# Patient Record
Sex: Male | Born: 1945 | Race: White | Hispanic: No | Marital: Married | Smoking: Former smoker
Health system: Southern US, Community
[De-identification: ages and names within clinical notes are randomized; demographics above are authoritative.]

## PROBLEM LIST (undated history)

## (undated) DIAGNOSIS — F32A Depression, unspecified: Secondary | ICD-10-CM

## (undated) DIAGNOSIS — M1990 Unspecified osteoarthritis, unspecified site: Secondary | ICD-10-CM

## (undated) DIAGNOSIS — R011 Cardiac murmur, unspecified: Secondary | ICD-10-CM

## (undated) DIAGNOSIS — Z8709 Personal history of other diseases of the respiratory system: Secondary | ICD-10-CM

## (undated) DIAGNOSIS — R519 Headache, unspecified: Secondary | ICD-10-CM

## (undated) DIAGNOSIS — Z8701 Personal history of pneumonia (recurrent): Secondary | ICD-10-CM

## (undated) DIAGNOSIS — E785 Hyperlipidemia, unspecified: Secondary | ICD-10-CM

## (undated) DIAGNOSIS — H9190 Unspecified hearing loss, unspecified ear: Secondary | ICD-10-CM

## (undated) DIAGNOSIS — K5730 Diverticulosis of large intestine without perforation or abscess without bleeding: Secondary | ICD-10-CM

## (undated) DIAGNOSIS — R110 Nausea: Secondary | ICD-10-CM

## (undated) DIAGNOSIS — I10 Essential (primary) hypertension: Secondary | ICD-10-CM

## (undated) DIAGNOSIS — R51 Headache: Secondary | ICD-10-CM

## (undated) DIAGNOSIS — K635 Polyp of colon: Secondary | ICD-10-CM

## (undated) DIAGNOSIS — T7840XA Allergy, unspecified, initial encounter: Secondary | ICD-10-CM

## (undated) DIAGNOSIS — R1013 Epigastric pain: Secondary | ICD-10-CM

## (undated) DIAGNOSIS — Z973 Presence of spectacles and contact lenses: Secondary | ICD-10-CM

## (undated) DIAGNOSIS — F329 Major depressive disorder, single episode, unspecified: Secondary | ICD-10-CM

## (undated) DIAGNOSIS — H269 Unspecified cataract: Secondary | ICD-10-CM

## (undated) DIAGNOSIS — M25569 Pain in unspecified knee: Secondary | ICD-10-CM

## (undated) DIAGNOSIS — R0600 Dyspnea, unspecified: Secondary | ICD-10-CM

## (undated) HISTORY — DX: Essential (primary) hypertension: I10

## (undated) HISTORY — DX: Hyperlipidemia, unspecified: E78.5

## (undated) HISTORY — DX: Allergy, unspecified, initial encounter: T78.40XA

## (undated) HISTORY — PX: POLYPECTOMY: SHX149

## (undated) HISTORY — DX: Unspecified osteoarthritis, unspecified site: M19.90

## (undated) HISTORY — PX: BACK SURGERY: SHX140

## (undated) HISTORY — PX: TONSILLECTOMY: SUR1361

## (undated) HISTORY — DX: Cardiac murmur, unspecified: R01.1

## (undated) HISTORY — PX: HERNIA REPAIR: SHX51

## (undated) HISTORY — DX: Depression, unspecified: F32.A

## (undated) HISTORY — PX: APPENDECTOMY: SHX54

## (undated) HISTORY — PX: JOINT REPLACEMENT: SHX530

## (undated) HISTORY — DX: Polyp of colon: K63.5

## (undated) HISTORY — DX: Epigastric pain: R10.13

## (undated) HISTORY — DX: Diverticulosis of large intestine without perforation or abscess without bleeding: K57.30

## (undated) HISTORY — DX: Major depressive disorder, single episode, unspecified: F32.9

## (undated) HISTORY — PX: MULTIPLE TOOTH EXTRACTIONS: SHX2053

## (undated) HISTORY — PX: EYE SURGERY: SHX253

## (undated) HISTORY — DX: Nausea: R11.0

## (undated) HISTORY — PX: SHOULDER SURGERY: SHX246

## (undated) HISTORY — DX: Unspecified cataract: H26.9

## (undated) HISTORY — DX: Pain in unspecified knee: M25.569

---

## 1997-12-25 ENCOUNTER — Inpatient Hospital Stay (HOSPITAL_COMMUNITY): Admission: EM | Admit: 1997-12-25 | Discharge: 1997-12-29 | Payer: Self-pay | Admitting: Emergency Medicine

## 1999-07-12 ENCOUNTER — Other Ambulatory Visit: Admission: RE | Admit: 1999-07-12 | Discharge: 1999-07-12 | Payer: Self-pay | Admitting: Urology

## 1999-10-26 ENCOUNTER — Other Ambulatory Visit: Admission: RE | Admit: 1999-10-26 | Discharge: 1999-10-26 | Payer: Self-pay | Admitting: Urology

## 2000-05-19 ENCOUNTER — Other Ambulatory Visit: Admission: RE | Admit: 2000-05-19 | Discharge: 2000-05-19 | Payer: Self-pay | Admitting: Urology

## 2000-05-19 ENCOUNTER — Encounter (INDEPENDENT_AMBULATORY_CARE_PROVIDER_SITE_OTHER): Payer: Self-pay

## 2000-10-24 ENCOUNTER — Ambulatory Visit (HOSPITAL_BASED_OUTPATIENT_CLINIC_OR_DEPARTMENT_OTHER): Admission: RE | Admit: 2000-10-24 | Discharge: 2000-10-25 | Payer: Self-pay | Admitting: Orthopedic Surgery

## 2004-10-08 ENCOUNTER — Ambulatory Visit: Payer: Self-pay | Admitting: Internal Medicine

## 2004-10-10 ENCOUNTER — Encounter: Payer: Self-pay | Admitting: Internal Medicine

## 2004-10-22 ENCOUNTER — Ambulatory Visit: Payer: Self-pay | Admitting: Internal Medicine

## 2004-11-06 ENCOUNTER — Ambulatory Visit: Payer: Self-pay | Admitting: Internal Medicine

## 2004-11-06 HISTORY — PX: COLONOSCOPY: SHX174

## 2007-02-24 ENCOUNTER — Ambulatory Visit: Payer: Self-pay | Admitting: Internal Medicine

## 2007-02-25 ENCOUNTER — Ambulatory Visit: Payer: Self-pay | Admitting: Internal Medicine

## 2007-03-02 ENCOUNTER — Ambulatory Visit (HOSPITAL_COMMUNITY): Admission: RE | Admit: 2007-03-02 | Discharge: 2007-03-02 | Payer: Self-pay | Admitting: Internal Medicine

## 2007-08-25 DIAGNOSIS — I10 Essential (primary) hypertension: Secondary | ICD-10-CM

## 2007-08-25 DIAGNOSIS — K644 Residual hemorrhoidal skin tags: Secondary | ICD-10-CM | POA: Insufficient documentation

## 2007-08-25 DIAGNOSIS — D126 Benign neoplasm of colon, unspecified: Secondary | ICD-10-CM

## 2007-08-25 DIAGNOSIS — R1013 Epigastric pain: Secondary | ICD-10-CM

## 2007-08-25 DIAGNOSIS — G4730 Sleep apnea, unspecified: Secondary | ICD-10-CM | POA: Insufficient documentation

## 2007-08-25 DIAGNOSIS — K209 Esophagitis, unspecified without bleeding: Secondary | ICD-10-CM | POA: Insufficient documentation

## 2007-08-25 DIAGNOSIS — E785 Hyperlipidemia, unspecified: Secondary | ICD-10-CM

## 2007-08-25 DIAGNOSIS — M1990 Unspecified osteoarthritis, unspecified site: Secondary | ICD-10-CM | POA: Insufficient documentation

## 2007-08-25 DIAGNOSIS — F39 Unspecified mood [affective] disorder: Secondary | ICD-10-CM | POA: Insufficient documentation

## 2007-08-25 DIAGNOSIS — K5730 Diverticulosis of large intestine without perforation or abscess without bleeding: Secondary | ICD-10-CM | POA: Insufficient documentation

## 2007-08-25 DIAGNOSIS — J45909 Unspecified asthma, uncomplicated: Secondary | ICD-10-CM | POA: Insufficient documentation

## 2007-08-25 DIAGNOSIS — K2990 Gastroduodenitis, unspecified, without bleeding: Secondary | ICD-10-CM

## 2007-08-25 DIAGNOSIS — R12 Heartburn: Secondary | ICD-10-CM

## 2007-08-25 DIAGNOSIS — K2970 Gastritis, unspecified, without bleeding: Secondary | ICD-10-CM | POA: Insufficient documentation

## 2007-08-25 DIAGNOSIS — K219 Gastro-esophageal reflux disease without esophagitis: Secondary | ICD-10-CM | POA: Insufficient documentation

## 2009-11-23 ENCOUNTER — Encounter (INDEPENDENT_AMBULATORY_CARE_PROVIDER_SITE_OTHER): Payer: Self-pay | Admitting: *Deleted

## 2010-04-24 ENCOUNTER — Ambulatory Visit: Payer: Self-pay | Admitting: Cardiology

## 2010-05-31 ENCOUNTER — Encounter (INDEPENDENT_AMBULATORY_CARE_PROVIDER_SITE_OTHER): Payer: Self-pay | Admitting: *Deleted

## 2010-06-19 ENCOUNTER — Encounter (INDEPENDENT_AMBULATORY_CARE_PROVIDER_SITE_OTHER): Payer: Self-pay | Admitting: *Deleted

## 2010-06-20 ENCOUNTER — Ambulatory Visit
Admission: RE | Admit: 2010-06-20 | Discharge: 2010-06-20 | Payer: Self-pay | Source: Home / Self Care | Attending: Internal Medicine | Admitting: Internal Medicine

## 2010-07-04 ENCOUNTER — Ambulatory Visit
Admission: RE | Admit: 2010-07-04 | Discharge: 2010-07-04 | Payer: Self-pay | Source: Home / Self Care | Attending: Internal Medicine | Admitting: Internal Medicine

## 2010-07-04 ENCOUNTER — Other Ambulatory Visit: Payer: Self-pay | Admitting: Internal Medicine

## 2010-07-04 DIAGNOSIS — D126 Benign neoplasm of colon, unspecified: Secondary | ICD-10-CM

## 2010-07-09 ENCOUNTER — Encounter: Payer: Self-pay | Admitting: Internal Medicine

## 2010-07-10 NOTE — Letter (Signed)
Summary: Colonoscopy Letter  [facility]  [address] [place], [place] [address]   Phone: [phone]  Fax: [phone]      November 23, 2009 MRN: [id number]   AMERE BRICCO [address] [place], [place]  [address]   Dear Mr. Goldinger,   According to your medical record, it is time for you to schedule a Colonoscopy. The American Cancer Society recommends this procedure as a method to detect early colon cancer. Patients with a family history of colon cancer, or a personal history of colon polyps or inflammatory bowel disease are at increased risk.  This letter has been generated based on the recommendations made at the time of your procedure. If you feel that in your particular situation this may no longer apply, please contact our office.  Please call our office at [phone] to schedule this appointment or to update your records at your earliest convenience.  Thank you for cooperating with [place] to provide you with the very best care possible.   Sincerely,  Wilhemina Bonito. Marina Goodell, M.D.  [facility] Gastroenterology Division [phone]

## 2010-07-12 NOTE — Procedures (Addendum)
Summary: Colonoscopy  Patient: Aubrey Voong Note: All result statuses are Final unless otherwise noted.  Tests: (1) Colonoscopy (COL)   COL Colonoscopy           DONE     [facility]     [address]     [place], [place]  [address]           COLONOSCOPY PROCEDURE REPORT           PATIENT:  Traylon, Schimming  MR#:  [id number]     BIRTHDATE:  02-29-1946, 64 yrs. old  GENDER:  male     ENDOSCOPIST:  Wilhemina Bonito. Eda Keys, MD     REF. BY:  Surveillance Program Recall,     PROCEDURE DATE:  07/04/2010     PROCEDURE:  Colonoscopy with snare polypectomy x 4     ASA CLASS:  Class II     INDICATIONS:  history of pre-cancerous (adenomatous) colon polyps,     surveillance and high-risk screening ; index 2004 w/ TA; f/u 2006     negative     MEDICATIONS:   Fentanyl 50 mcg IV, Versed 39 mg IV           DESCRIPTION OF PROCEDURE:   After the risks benefits and     alternatives of the procedure were thoroughly explained, informed     consent was obtained.  Digital rectal exam was performed and     revealed no abnormalities.   The LB CF-[id number] endoscope     was introduced through the anus and advanced to the cecum, which     was identified by both the appendix and ileocecal valve, without     limitations.Time to cecum = 2:30 min.  The quality of the prep was     excellent, using MoviPrep.  The instrument was then slowly     withdrawn (time = 13:41 min) as the colon was fully examined.     <<PROCEDUREIMAGES>>           FINDINGS:  There were FOUR polyps identified and removed. 5mm     sessile in the cecum, 4mm in the transverse colon, 3mm in the     sigmoid and 4mm in the descending colon. Polyps were snared     without cautery. Retrieval was successful.  Moderate     diverticulosis was found in the sigmoid colon.  Otherwise normal     colonoscopy without  masses, vascular ectasias, or inflammatory     changes.   Retroflexed views in the rectum revealed no     abnormalities.    The  scope was then withdrawn from the patient     and the procedure completed.           COMPLICATIONS:  None     ENDOSCOPIC IMPRESSION:     1) Polyps, multiple - removeds     2) Moderate diverticulosis in the sigmoid colon     3) Otherwise nl colonoscopy           RECOMMENDATIONS:     1) Repeat Colonoscopy in 3 years.           ______________________________     Wilhemina Bonito. Eda Keys, MD           CC:  The Patient;  Guerry Bruin, MD           n.     eSIGNED:   Wilhemina Bonito. Eda Keys at 07/04/2010 01:47 PM  Dolph, Tavano, [id number]  Note: An exclamation mark (!) indicates a result that was not dispersed into the flowsheet. Document Creation Date: 07/04/2010 1:47 PM _______________________________________________________________________  (1) Order result status: Final Collection or observation date-time: 07/04/2010 13:36 Requested date-time:  Receipt date-time:  Reported date-time:  Referring Physician:   Ordering Physician: Fransico Setters [id number]) Specimen Source:  Source: Launa Grill Order Number: [id number] Lab site:   Appended Document: Colonoscopy recall 3 yrs     Procedures Next Due Date:    Colonoscopy: 06/2013

## 2010-07-12 NOTE — Miscellaneous (Signed)
Summary: [facility] Previsit/prep  Clinical Lists Changes  Medications: Added new medication of MOVIPREP 100 GM  SOLR (PEG-KCL-NACL-NASULF-NA ASC-C) As per prep instructions. - Signed Rx of MOVIPREP 100 GM  SOLR (PEG-KCL-NACL-NASULF-NA ASC-C) As per prep instructions.;  #1 x 0;  Signed;  Entered by: Wyona Almas RN;  Authorized by: Hilarie Fredrickson MD;  Method used: Electronically to [facility] [place], [place], [place]  [address], Ph: [phone], Fax: [phone] Allergies: Added new allergy or adverse reaction of AMOXICILLIN Added new allergy or adverse reaction of LIPITOR Added new allergy or adverse reaction of * PROVASTATIN Observations: Added new observation of NKA: F (06/20/2010 10:42)    Prescriptions: MOVIPREP 100 GM  SOLR (PEG-KCL-NACL-NASULF-NA ASC-C) As per prep instructions.  #1 x 0   Entered by:   Wyona Almas RN   Authorized by:   Hilarie Fredrickson MD   Signed by:   Wyona Almas RN on 06/20/2010   Method used:   Electronically to        [facility] (retail)       [address]       [place], [place]  [address]       Ph: [phone]       Fax: [phone]   RxID:   [id number]

## 2010-07-12 NOTE — Letter (Signed)
Summary: [facility] Instructions  [facility]  [address] [place], [place] [address]   Phone: [phone]  Fax: [phone]       [name]    06/04/1946    MRN: [id number]        Procedure Day Dorna Bloom: Wednesday 07/04/10     Arrival Time:  10:30 am      Procedure Time:  11:30 am     Location of Procedure:                    _X_  [facility] (4th Floor)                       PREPARATION FOR COLONOSCOPY WITH MOVIPREP   Starting 5 days prior to your procedure Friday 06/29/10 do not eat nuts, seeds, popcorn, corn, beans, peas,  salads, or any raw vegetables.  Do not take any fiber supplements (e.g. Metamucil, Citrucel, and Benefiber).  THE DAY BEFORE YOUR PROCEDURE         DATE: 07/03/10  DAY: Tuesday  1.  Drink clear liquids the entire day-NO SOLID FOOD  2.  Do not drink anything colored red or purple.  Avoid juices with pulp.  No orange juice.  3.  Drink at least 64 oz. (8 glasses) of fluid/clear liquids during the day to prevent dehydration and help the prep work efficiently.  CLEAR LIQUIDS INCLUDE: Water Jello Ice Popsicles Tea (sugar ok, no milk/cream) Powdered fruit flavored drinks Coffee (sugar ok, no milk/cream) Gatorade Juice: apple, white grape, white cranberry  Lemonade Clear bullion, consomm, broth Carbonated beverages (any kind) Strained chicken noodle soup Hard Candy                             4.  In the morning, mix first dose of MoviPrep solution:    Empty 1 Pouch A and 1 Pouch B into the disposable container    Add lukewarm drinking water to the top line of the container. Mix to dissolve    Refrigerate (mixed solution should be used within 24 hrs)  5.  Begin drinking the prep at 5:00 p.m. The MoviPrep container is divided by 4 marks.   Every 15 minutes drink the solution down to the next mark (approximately 8 oz) until the full liter is complete.   6.  Follow completed prep with 16 oz of clear liquid of your choice  (Nothing red or purple).  Continue to drink clear liquids until bedtime.  7.  Before going to bed, mix second dose of MoviPrep solution:    Empty 1 Pouch A and 1 Pouch B into the disposable container    Add lukewarm drinking water to the top line of the container. Mix to dissolve    Refrigerate  THE DAY OF YOUR PROCEDURE      DATE: 07/04/10  DAY: Wednesday  Beginning at 6:30 a.m. (5 hours before procedure):         1. Every 15 minutes, drink the solution down to the next mark (approx 8 oz) until the full liter is complete.  2. Follow completed prep with 16 oz. of clear liquid of your choice.    3. You may drink clear liquids until 9:30 am (2 HOURS BEFORE PROCEDURE).   MEDICATION INSTRUCTIONS  Unless otherwise instructed, you should take regular prescription medications with a small sip of water   as early as possible  the morning of your procedure.        OTHER INSTRUCTIONS  You will need a responsible adult at least 67 years of age to accompany you and drive you home.   This person must remain in the waiting room during your procedure.  Wear loose fitting clothing that is easily removed.  Leave jewelry and other valuables at home.  However, you may wish to bring a book to read or  an iPod/MP3 player to listen to music as you wait for your procedure to start.  Remove all body piercing jewelry and leave at home.  Total time from sign-in until discharge is approximately 2-3 hours.  You should go home directly after your procedure and rest.  You can resume normal activities the  day after your procedure.  The day of your procedure you should not:   Drive   Make legal decisions   Operate machinery   Drink alcohol   Return to work  You will receive specific instructions about eating, activities and medications before you leave.    The above instructions have been reviewed and explained to me by   Wyona Almas RN  June 20, 2010 11:54 AM     I fully  understand and can verbalize these instructions _____________________________ Date _________

## 2010-07-12 NOTE — Letter (Signed)
Summary: Pre Visit Letter Revised  [facility]  [address] [place], [place] [address]   Phone: [phone]  Fax: [phone]        05/31/2010 MRN: [id number] [name] [address] [place], [place]  [address]             Procedure Date:  07/04/2010   Dr. Marina Goodell @ 11:30 am  Welcome to the Gastroenterology Division at [facility].    You are scheduled to see a nurse for your pre-procedure visit on 06/20/2010 at 11:00 on the 3rd floor at [facility], [address]  We ask that you try to arrive at our office 15 minutes prior to your appointment time to allow for check-in.  Please take a minute to review the attached form.  If you answer "Yes" to one or more of the questions on the first page, we ask that you call the person listed at your earliest opportunity.  If you answer "No" to all of the questions, please complete the rest of the form and bring it to your appointment.    Your nurse visit will consist of discussing your medical and surgical history, your immediate family medical history, and your medications.   If you are unable to list all of your medications on the form, please bring the medication bottles to your appointment and we will list them.  We will need to be aware of both prescribed and over the counter drugs.  We will need to know exact dosage information as well.    Please be prepared to read and sign documents such as consent forms, a financial agreement, and acknowledgement forms.  If necessary, and with your consent, a friend or relative is welcome to sit-in on the nurse visit with you.  Please bring your insurance card so that we may make a copy of it.  If your insurance requires a referral to see a specialist, please bring your referral form from your primary care physician.  No co-pay is required for this nurse visit.     If you cannot keep your appointment, please call [phone] to cancel or reschedule prior to your  appointment date.  This allows [place] the opportunity to schedule an appointment for another patient in need of care.    Thank you for choosing [facility] for your medical needs.  We appreciate the opportunity to care for you.  Please visit [place] at our website  to learn more about our practice.  Sincerely, The Gastroenterology Division

## 2010-07-18 NOTE — Letter (Signed)
Summary: Patient Notice- Polyp Results  [facility]  [address] [place], [place] [address]   Phone: [phone]  Fax: [phone]        July 09, 2010 MRN: [id number]    [name] [address] [place], [place]  [address]    Dear [name],  I am pleased to inform you that the colon polyp(s) removed during your recent colonoscopy was (were) found to be benign (no cancer detected) upon pathologic examination.  I recommend you have a repeat colonoscopy examination in 3 years to look for recurrent polyps, as having colon polyps increases your risk for having recurrent polyps or even colon cancer in the future.  Should you develop new or worsening symptoms of abdominal pain, bowel habit changes or bleeding from the rectum or bowels, please schedule an evaluation with either your primary care physician or with me.    Additional information/recommendations:  __ No further action with gastroenterology is needed at this time. Please      follow-up with your primary care physician for your other healthcare      needs.    Please call us if you are having persistent problems or have questions about your condition that have not been fully answered at this time.  Sincerely,  Hilarie Fredrickson MD  This letter has been electronically signed by your physician.  Appended Document: Patient Notice- Polyp Results LETTER MAILED

## 2010-10-23 NOTE — Assessment & Plan Note (Signed)
[facility]                         GASTROENTEROLOGY OFFICE NOTE   NAME:Mccarey, JAZON JIPSON                       MRN:          [id number]  DATE:02/24/2007                            DOB:          1945-07-02    REFERRING PHYSICIAN:  The patient is self-referred.   REASON FOR VISIT:  Epigastric pain.   HISTORY:  This is a 65 year old white male with a history of  hypertension, asthma, hyperlipidemia, osteoarthritis, depression, sleep  apnea, and adenomatous colon polyps.  He last underwent colonoscopy on  Nov 06, 2004.  He was found to have sigmoid diverticulosis.  No polyps.  Follow up in five years recommended.  He presents now with a complaint  of epigastric pain of one years' duration.  He describes it as constant  and dull.  It seems to be improved transiently with food.  Occasional  nausea.  No vomiting.  He has significant pyrosis as well as  intermittent water brash, particularly at night with dietary  indiscretion.  He has also had three discrete episodes of significant  dysphagia with transient food impaction over the past year.  He was  placed on some Nexium samples by Dr. Wylene Simmer but was not certain that  this was helpful.  He has had steady weight gain.  He is anxious.  The  patient has been on nonsteroidal anti-inflammatory agents intermittently  for knee pain.  He does report transient dark stools.  No hematochezia.  No urinary complaints.   PAST MEDICAL HISTORY:  As above.   PAST SURGICAL HISTORY:  1. Hernia repair.  2. Appendectomy.   CURRENT MEDICATIONS:  1. Benicar 20 mg daily.  2. Celebrex 200 mg daily.  3. Zetia 10 mg daily.  4. Multivitamin.  5. Vitamin E.  6. Saw Palmetto.  7. Baby aspirin.  8. Advil and Aleve p.r.n.   ALLERGIES:  AMOXICILLIN.   FAMILY HISTORY:  Colon cancer in maternal grandfather.   SOCIAL HISTORY:  Married with three children.  He lives with his wife.  He does not smoke or use alcohol.   PHYSICAL EXAMINATION:  GENERAL:  Anxious, but otherwise well-appearing  male in no acute distress.  VITAL SIGNS:  Blood pressure 108/70, heart rate 76 and regular, weight  229 pounds (increased 6 pounds).  HEENT:  Sclerae anicteric, conjunctivae pink.  Oral mucosa is intact.  No adenopathy.  LUNGS:  Clear.  HEART:  Regular.  ABDOMEN:  Obese and soft without tenderness, mass, or hernia.  Good  bowel sounds heard.  EXTREMITIES:  Without edema.   IMPRESSION:  1. Gastroesophageal reflux disease with intermittent dysphagia.  Rule      out peptic stricture.  2. Chronic gnawing epigastric discomfort possibly related to reflux.      Rule out ulcer disease.  Rule out gallbladder disease.   RECOMMENDATIONS:  1. Prevacid 30 mg daily.  Multiple samples have been provided.  2. Schedule upper endoscopy with possible esophageal dilation.  The      nature of the procedure as well as the risks, benefits, and      alternatives have been reviewed.  He  understood and agreed to      proceed.  Literature on the procedure was provided.  3. Reflux precautions reviewed and literature provided.  4. Schedule abdominal ultrasound to evaluate epigastric pain.  5. Keep plans for surveillance colonoscopy in 2011.     Wilhemina Bonito. Marina Goodell, MD  Electronically Signed    JNP/MedQ  DD: 02/24/2007  DT: 02/24/2007  Job #: [id number]   cc:   Gaspar Garbe, M.D.

## 2010-10-26 NOTE — Op Note (Signed)
[place]  Patient:    [name], [name]                         MRN: [id number]. Date: 10/24/00 Attending:  Loreta Ave, M.D.                           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  OPERATION:  Repair/reconstruction AC separation right shoulder with coracoclavicular repair with fiber wire suture, #2 times two.  Resection distal clavicle.  Transfer coracoacromial ligament into the distal clavicle reconstructing coracoclavicular ligament.  SURGEON:  Loreta Ave, M.D.  ASSISTANT:  Arlys John D. Petrarca, P.A.-C.  ANESTHESIA:  General.  ESTIMATED BLOOD LOSS:  Minimal.  SPECIMENS: None.  CULTURES:  None.  COMPLICATIONS:  None.  DRESSING: Soft compressive with sling.  DESCRIPTION OF PROCEDURE:  The patient was brought to the operating room and after adequate anesthesia had been obtained, placed in a beach chair position on the shoulder positioner, prepped and draped in the usual sterile fashion. Saber incision over the clavicle down towards the coracoid.  Skin and subcutaneous tissue divided.  Deltopectoral fascia opened over the top of the clavicle extending over to the acromion.  Vicryl sutures placed for retraction of the pectoralis anteriorly.  The clavicle exposed with grade IV changes distally. Blunt dissection was used to open the interval below the clavicle extending down to the coracoid protecting neurovascular structures.  A right ankle retractor was used to access under the coracoid and utilizing this and a suture passer, two #2 fiber wire sutures were passed under the coracoid and brought up to the bottom of the clavicle.  Directly above the coracoid a drill hole  was made through the clavicle and the sutures were weaved through the clavicle in a figure-of-eight manner and tagged.  Distal centimeter of clavicle was then resected sharply.  The end of the clavicle was partially hollowed out to allow transfer of the coracoacromial ligament.  The coracoacromial ligament was accessed off the front of the acromion through the split in the deltoid musculature.  It was freed up left attached to the coracoid.  It was then tagged with two #2 Ethibon sutures which were weaved down into the ligament and then freed up sufficiently to transfer it over to the end of the clavicle.  The Ethibon sutures were then brought into the end of the clavicle and out through the superior side through two cortical drill holes.  Once those were in place, the wound was irrigated.  The clavicle was then reduced to an anatomic position over the top of the clavicle even with the top of the acromion.  The Ethibon sutures which had been weaved into the ligament were then pulled tight and tied over the top of the clavicle bringing the transverse ligament into the cancellus bone at the end of the clavicle. The other #2 fiber wire suture was then firmly sewn in place restoring stability of the coracoclavicular interval.  The knots on those sutures were pulled around and tucked down anteriorly so as to not be permanent.  The wound was irrigated.  Position of the distal clavicle was then accessed fluoroscopically and excellent.  With resection of the clavicle there was no contact  with the acromion.  Wound irrigated again.  The muscular sleeve was then oversewn and imbricated over top of the repair to ensure good stability. Vicryl was utilized for this.  The wound was then closed with subcutaneous and subcuticular Vicryl and Steri-Strips. Margins of the wound were injected with Marcaine.  A sterile compressive dressing and shoulder immobilizer were applied.  Anesthesia reversed. Brought to  recovery room. Tolerated surgery well with no complications. DD:  10/24/00 TD:  10/27/00 Job: [id number]

## 2011-02-20 ENCOUNTER — Other Ambulatory Visit: Payer: Self-pay | Admitting: Cardiology

## 2011-02-20 NOTE — Telephone Encounter (Signed)
Refilled Meds from fax  

## 2011-04-29 ENCOUNTER — Encounter: Payer: Self-pay | Admitting: Cardiology

## 2011-04-29 ENCOUNTER — Ambulatory Visit (INDEPENDENT_AMBULATORY_CARE_PROVIDER_SITE_OTHER): Payer: Medicare Other | Admitting: Cardiology

## 2011-04-29 VITALS — BP 128/83 | HR 69 | Ht 70.0 in | Wt 231.0 lb

## 2011-04-29 DIAGNOSIS — I10 Essential (primary) hypertension: Secondary | ICD-10-CM

## 2011-04-29 DIAGNOSIS — E785 Hyperlipidemia, unspecified: Secondary | ICD-10-CM

## 2011-04-29 NOTE — Patient Instructions (Signed)
I encourage you to increase your aerobic activity.  Continue your current medications.  Watch your diet and try and lose weight.  I will see you again in 1 year.

## 2011-04-29 NOTE — Progress Notes (Signed)
   [name] Date of Birth: [birth year] Medical Record #[id number]  History of Present Illness: [name] is seen for yearly follow up of HTN. He has a number of drug intolerances. He is doing well on benazepril. Blood pressure readings are good. No chest pain or dyspnea. Noted weight gain related to some anti-anxiety medication.  Current Outpatient Prescriptions on File Prior to Visit  Medication Sig Dispense Refill  . aspirin 81 MG tablet Take 81 mg by mouth daily.        . Cholecalciferol 5000 UNITS capsule Take 5,000 Units by mouth daily.        . folic acid (FOLVITE) 400 MCG tablet Take 800 mcg by mouth daily.        . multivitamin (THERAGRAN) per tablet Take 1 tablet by mouth daily.        Marland Kitchen DISCONTD: benazepril (LOTENSIN) 20 MG tablet TAKE 20 MG (1 TABLET IN AM), AND TAKE 10 MG (1/2 TABLET IN PM)  135 tablet  3    Allergies  Allergen Reactions  . Amoxicillin     REACTION: hives  . Atorvastatin     REACTION: Weakness in muscles  . Statins     Weakness in muscles  . Toprol Xl (Metoprolol Succinate)     Past Medical History  Diagnosis Date  . Hypertension   . Asthma   . Hyperlipidemia   . Osteoarthritis   . Depression   . Sleep apnea   . Colon polyps   . Sigmoid diverticulosis   . Epigastric pain   . Nausea     Occasional  . Knee pain     Past Surgical History  Procedure Date  . Colonoscopy  Nov 06, 2004  . Hernia repair   . Appendectomy     History  Smoking status  . Former Smoker  . Types: Cigarettes  . Quit date: 04/29/1983  Smokeless tobacco  . Never Used    History  Alcohol Use No    Family History  Problem Relation Age of Onset  . Cancer Maternal Grandfather     Colon Cancer    Review of Systems: As noted in HPI.  All other systems were reviewed and are negative.  Physical Exam: BP 128/83  Pulse 69  Ht 5\' 10"  (1.778 m)  Wt 231 lb (104.781 kg)  BMI 33.15 kg/m2 The patient is alert and oriented x 3.  The mood and affect are normal.   The skin is warm and dry.  Color is normal.  The HEENT exam reveals that the sclera are nonicteric.  The mucous membranes are moist.  The carotids are 2+ without bruits.  There is no thyromegaly.  There is no JVD.  The lungs are clear.  The chest wall is non tender.  The heart exam reveals a regular rate with a normal S1 and S2.  There are no murmurs, gallops, or rubs.  The PMI is not displaced.   Abdominal exam reveals good bowel sounds.  There is no guarding or rebound.  There is no hepatosplenomegaly or tenderness.  There are no masses.  Exam of the legs reveal no clubbing, cyanosis, or edema.  The legs are without rashes.  The distal pulses are intact.  Cranial nerves II - XII are intact.  Motor and sensory functions are intact.  The gait is normal.  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 Otherwise normal.  Assessment / Plan:

## 2011-04-29 NOTE — Assessment & Plan Note (Signed)
Blood pressure control is excellent. He is monitoring this closely at home. We will continue with his current dose of ACE inhibitor. I will see him back again in one year. His blood work is monitored routinely by his primary care.

## 2011-06-06 ENCOUNTER — Telehealth: Payer: Self-pay | Admitting: Internal Medicine

## 2011-06-06 NOTE — Telephone Encounter (Signed)
Patient reports that he has a 1 week history of abdominal pain.  He helped a friend move some furniture about 3 weeks ago and had back pain then developed constipation.  He has taken some laxatives to help with the constipation, he still has some pain on his left side even after taking laxatives. He also c/o of a "very sore spot about my belt line", he says the pain comes and goes from his back to the front.  I have advised he see his primary care or a urgent care today due to the lateness of the hour,.  Patient states he will go to an urgent care.

## 2011-07-02 ENCOUNTER — Other Ambulatory Visit: Payer: Self-pay | Admitting: Physical Medicine and Rehabilitation

## 2011-07-02 DIAGNOSIS — M545 Low back pain: Secondary | ICD-10-CM

## 2011-07-08 ENCOUNTER — Ambulatory Visit
Admission: RE | Admit: 2011-07-08 | Discharge: 2011-07-08 | Disposition: A | Discharge status: Home / Self Care | Payer: Medicare Other | Source: Ambulatory Visit | Attending: Physical Medicine and Rehabilitation | Admitting: Physical Medicine and Rehabilitation

## 2011-07-08 DIAGNOSIS — M545 Low back pain: Secondary | ICD-10-CM

## 2012-01-16 ENCOUNTER — Other Ambulatory Visit: Payer: Self-pay

## 2012-01-22 ENCOUNTER — Other Ambulatory Visit: Payer: Self-pay

## 2012-01-22 MED ORDER — BENAZEPRIL HCL 20 MG PO TABS: ORAL_TABLET | ORAL | Status: DC

## 2012-07-07 ENCOUNTER — Encounter: Payer: Self-pay | Admitting: Cardiology

## 2012-07-07 ENCOUNTER — Ambulatory Visit (INDEPENDENT_AMBULATORY_CARE_PROVIDER_SITE_OTHER): Payer: Medicare Other | Admitting: Cardiology

## 2012-07-07 VITALS — BP 118/79 | HR 74 | Wt 235.0 lb

## 2012-07-07 DIAGNOSIS — I10 Essential (primary) hypertension: Secondary | ICD-10-CM

## 2012-07-07 DIAGNOSIS — E785 Hyperlipidemia, unspecified: Secondary | ICD-10-CM

## 2012-07-07 NOTE — Patient Instructions (Signed)
Continue your current therapy  Focus on lifestyle modification with a Mediterranean diet, weight loss, and exercise.

## 2012-07-07 NOTE — Progress Notes (Signed)
[name] Date of Birth: 13-Jan-1946 Medical Record #[id number]  History of Present Illness: [name] is seen for yearly follow up of HTN. He has multiple drug intolerances. He is doing well on benazepril. Blood pressure readings are good. No chest pain or dyspnea. He has had problems with chronic back pain. This improved significantly with epidural injections but he always notices a 12-15 pound weight gain after each injection. He complains of his hands hurting a lot. He has increased his naproxen dose to 500 mg twice daily with some improvement. He thinks a lot of his aches and pains are related to the vitamin D he was taking and he has eliminated this.  Current Outpatient Prescriptions on File Prior to Visit  Medication Sig Dispense Refill  . aspirin 81 MG tablet Take 81 mg by mouth daily.        . benazepril (LOTENSIN) 20 MG tablet Take 50 mg by mouth daily.      . butalbital-acetaminophen-caffeine (ESGIC PLUS) 50-500-40 MG per tablet Take 1-2 tablets by mouth every 4 (four) hours as needed.        . folic acid (FOLVITE) 400 MCG tablet Take 800 mcg by mouth daily.        . multivitamin (THERAGRAN) per tablet Take 1 tablet by mouth daily.        . naproxen sodium (ANAPROX) 220 MG tablet Take 220 mg by mouth. 2 tablets in the morning and 1 tablet at night         Allergies  Allergen Reactions  . Amoxicillin     REACTION: hives  . Atorvastatin     REACTION: Weakness in muscles  . Statins     Weakness in muscles  . Toprol Xl (Metoprolol Succinate)     Past Medical History  Diagnosis Date  . Hypertension   . Asthma   . Hyperlipidemia   . Osteoarthritis   . Depression   . Sleep apnea   . Colon polyps   . Sigmoid diverticulosis   . Epigastric pain   . Nausea     Occasional  . Knee pain     Past Surgical History  Procedure Date  . Colonoscopy  Nov 06, 2004  . Hernia repair   . Appendectomy     History  Smoking status  . Former Smoker  . Types: Cigarettes  . Quit  date: 04/29/1983  Smokeless tobacco  . Never Used    History  Alcohol Use No    Family History  Problem Relation Age of Onset  . Cancer Maternal Grandfather     Colon Cancer    Review of Systems: As noted in HPI.  All other systems were reviewed and are negative.  Physical Exam: BP 118/79  Pulse 74  Wt 235 lb (106.595 kg) The patient is alert and oriented x 3.  The mood and affect are normal.  The skin is warm and dry.  Color is normal.  The HEENT exam is unremarkable.  The carotids are 2+ without bruits.  There is no thyromegaly.  There is no JVD.  The lungs are clear.  The heart exam reveals a regular rate with a normal S1 and S2.  There are no murmurs, gallops, or rubs.  The PMI is not displaced.   Abdominal exam reveals good bowel sounds.  There is no hepatosplenomegaly or tenderness.  There are no masses.  Exam of the legs reveal no clubbing, cyanosis, or edema.  The legs are without rashes.  The distal pulses are intact.  Cranial nerves II - XII are intact.  Motor and sensory functions are intact.  The gait is normal.  LABORATORY DATA: Ecg shows normal sinus rhythm with a normal ECG.  Assessment / Plan: 1. Hypertension. Well-controlled on benazepril. I would try to minimize his use of nonsteroidal anti-inflammatory drugs. I'll followup again in one year.  2. Hyperlipidemia. Recent lipid panel showed a total cholesterol of 288 with triglycerides of 279. He has been intolerant to statins in the past. He is on fish oil. I recommended lifestyle modifications with Mediterranean diet, weight loss, and regular aerobic exercise. He has been placed on a low-dose of Zetia.

## 2012-08-12 ENCOUNTER — Encounter: Payer: Self-pay | Admitting: Cardiology

## 2013-04-12 DIAGNOSIS — N529 Male erectile dysfunction, unspecified: Secondary | ICD-10-CM | POA: Insufficient documentation

## 2013-06-23 ENCOUNTER — Encounter: Payer: Self-pay | Admitting: Internal Medicine

## 2013-07-15 ENCOUNTER — Telehealth: Payer: Self-pay | Admitting: Internal Medicine

## 2013-07-15 NOTE — Telephone Encounter (Signed)
Pt received recall letter in the mail for his colon. Pt would like to have an EGD also. Last EGD was done in 2008. Pt states he does still have reflux, states he takes Tums Ultra when needed. Please advise.

## 2013-07-15 NOTE — Telephone Encounter (Signed)
Doesn't need EGD. Rather, should take omeprazole 20 mg daily (not Tums). We can call him in a prescription if he wishes.

## 2013-07-16 ENCOUNTER — Ambulatory Visit (AMBULATORY_SURGERY_CENTER): Payer: Self-pay | Admitting: *Deleted

## 2013-07-16 VITALS — Ht 70.0 in | Wt 235.4 lb

## 2013-07-16 DIAGNOSIS — Z86010 Personal history of colonic polyps: Secondary | ICD-10-CM

## 2013-07-16 MED ORDER — MOVIPREP 100 G PO SOLR: 1.0000 | Freq: Once | ORAL | Status: DC

## 2013-07-16 NOTE — Telephone Encounter (Signed)
Pt aware. Pt scheduled for recall colon 07/23/13@3pm . Pt coming today for previsit. Pt aware of appt.

## 2013-07-16 NOTE — Progress Notes (Signed)
No egg or soy allergy. ewm No cpap or home 02 use. ewm Pt declined emmi video. ewm Pt denies problems with past sedation. ewm

## 2013-07-19 ENCOUNTER — Encounter: Payer: Self-pay | Admitting: Cardiology

## 2013-07-19 ENCOUNTER — Ambulatory Visit (INDEPENDENT_AMBULATORY_CARE_PROVIDER_SITE_OTHER): Payer: Medicare HMO | Admitting: Cardiology

## 2013-07-19 VITALS — BP 138/84 | HR 67 | Ht 70.0 in | Wt 233.1 lb

## 2013-07-19 DIAGNOSIS — R0609 Other forms of dyspnea: Secondary | ICD-10-CM

## 2013-07-19 DIAGNOSIS — E785 Hyperlipidemia, unspecified: Secondary | ICD-10-CM

## 2013-07-19 DIAGNOSIS — R0600 Dyspnea, unspecified: Secondary | ICD-10-CM | POA: Insufficient documentation

## 2013-07-19 DIAGNOSIS — I10 Essential (primary) hypertension: Secondary | ICD-10-CM

## 2013-07-19 DIAGNOSIS — R0989 Other specified symptoms and signs involving the circulatory and respiratory systems: Secondary | ICD-10-CM

## 2013-07-19 MED ORDER — NAPROXEN 500 MG PO TABS: 500.0000 mg | ORAL_TABLET | Freq: Two times a day (BID) | ORAL | Status: DC

## 2013-07-19 MED ORDER — EZETIMIBE 10 MG PO TABS: 10.0000 mg | ORAL_TABLET | Freq: Every day | ORAL | Status: DC

## 2013-07-19 MED ORDER — FENOFIBRATE 160 MG PO TABS: 160.0000 mg | ORAL_TABLET | Freq: Every day | ORAL | Status: DC

## 2013-07-19 NOTE — Patient Instructions (Signed)
We will add fenofibrate 160 mg daily  Continue your other therapy  I will see you in one year

## 2013-07-19 NOTE — Progress Notes (Signed)
[name] Date of Birth: [birth year] Medical Record #[id number]  History of Present Illness: [name] is seen for yearly follow up of HTN and hyperlipidemia. He has multiple drug intolerances. He is doing well on benazepril. Blood pressure readings are good. No chest pain. He does complain of dyspnea with walking. His arthralgias are doing well on Naproxen 500 mg bid. He brings a copy of his recent lab work.  Current Outpatient Prescriptions on File Prior to Visit  Medication Sig Dispense Refill  . aspirin 81 MG tablet Take 81 mg by mouth daily.        . benazepril (LOTENSIN) 20 MG tablet Take 40 mg by mouth daily.       . butalbital-acetaminophen-caffeine (ESGIC PLUS) 50-500-40 MG per tablet Take 1-2 tablets by mouth every 4 (four) hours as needed.        . ezetimibe (ZETIA) 10 MG tablet Take 10 mg by mouth daily.      . folic acid (FOLVITE) 409 MCG tablet Take 800 mcg by mouth daily.        . multivitamin (THERAGRAN) per tablet Take 1 tablet by mouth daily.        . naproxen (NAPROSYN) 500 MG tablet Take 500 mg by mouth 2 (two) times daily with a meal.      . MOVIPREP 100 G SOLR Take 1 kit (200 g total) by mouth once. moviprep as directed. No substitutions  1 kit  0   No current facility-administered medications on file prior to visit.    Allergies  Allergen Reactions  . Toprol Xl [Metoprolol Succinate] Other (See Comments)    Pt states is " like a govenor on a bus"   . Amoxicillin     REACTION: hives  . Atorvastatin     REACTION: Weakness in muscles  . Statins     Weakness in muscles    Past Medical History  Diagnosis Date  . Hypertension   . Asthma   . Hyperlipidemia   . Osteoarthritis   . Depression   . Sleep apnea   . Colon polyps   . Sigmoid diverticulosis   . Epigastric pain   . Nausea     Occasional  . Knee pain     Past Surgical History  Procedure Laterality Date  . Colonoscopy   Nov 06, 2004  . Hernia repair    . Appendectomy    . Polypectomy    .  Tonsillectomy      History  Smoking status  . Former Smoker  . Types: Cigarettes  . Quit date: 04/29/1983  Smokeless tobacco  . Never Used    History  Alcohol Use  . Yes    Comment: occ alcohol    Family History  Problem Relation Age of Onset  . Cancer Maternal Grandfather     Colon Cancer  . Colon cancer Maternal Grandfather   . Heart disease Father     Review of Systems: As noted in HPI.  All other systems were reviewed and are negative.  Physical Exam: BP 138/84  Pulse 67  Ht 5' 10"  (1.778 m)  Wt 233 lb 1.9 oz (105.743 kg)  BMI 33.45 kg/m2 He is a WD WM in NAD. The HEENT exam is unremarkable.  The carotids are 2+ without bruits.  There is no thyromegaly.  There is no JVD.  The lungs are clear.  The heart exam reveals a regular rate with a normal S1 and S2.  There are no murmurs,  gallops, or rubs.  The PMI is not displaced.   Abdominal exam reveals good bowel sounds.  There is no hepatosplenomegaly or tenderness.  There are no masses.  Exam of the legs reveal no clubbing, cyanosis, or edema.  The legs are without rashes.  The distal pulses are intact.  Cranial nerves II - XII are intact.  Motor and sensory functions are intact.  The gait is normal.  LABORATORY DATA: Ecg shows normal sinus rhythm with a normal ECG.  Labs from 07/09/13: Normal complete chemistry panel and CBC. Chol-241, trig-269,HDL-48, LDL- 139  Assessment / Plan: 1. Hypertension. Well-controlled on benazepril. Continue same therapy.  2. Hyperlipidemia.  He has been intolerant to statins in the past.  I recommended lifestyle modifications with Mediterranean diet, weight loss, and regular aerobic exercise. Continue Zetia. Add fenofibrate 160 mg daily.  3. Dyspnea- may be related to weight and sinus allergies. Last stress test was in 2005. Will schedule for exercise stress test.

## 2013-07-23 ENCOUNTER — Ambulatory Visit (AMBULATORY_SURGERY_CENTER): Payer: Medicare HMO | Admitting: Internal Medicine

## 2013-07-23 ENCOUNTER — Encounter: Payer: Self-pay | Admitting: Internal Medicine

## 2013-07-23 VITALS — BP 122/66 | HR 56 | Temp 97.0°F | Resp 18 | Ht 70.0 in | Wt 235.0 lb

## 2013-07-23 DIAGNOSIS — Z86010 Personal history of colonic polyps: Secondary | ICD-10-CM

## 2013-07-23 MED ORDER — SODIUM CHLORIDE 0.9 % IV SOLN: 500.0000 mL | INTRAVENOUS | Status: DC

## 2013-07-23 NOTE — Progress Notes (Signed)
A/ox3 pleased with MAC, report to Karol RN 

## 2013-07-23 NOTE — Patient Instructions (Signed)
Information sheet given on Diverticulosis.  YOU HAD AN ENDOSCOPIC PROCEDURE TODAY AT [facility]: Refer to the procedure report that was given to you for any specific  questions about what was found during the examination.  If the procedure report does not answer your questions, please call your gastroenterologist to clarify.  If you requested that your care partner not be given the details of your procedure findings, then the procedure report has been included in a sealed envelope for you to review at your convenience later.  YOU SHOULD EXPECT: Some feelings of bloating in the abdomen. Passage of more gas than usual.  Walking can help get rid of the air that was put into your GI tract during the procedure and reduce the bloating. If you had a lower endoscopy (such as a colonoscopy or flexible sigmoidoscopy) you may notice spotting of blood in your stool or on the toilet paper. If you underwent a bowel prep for your procedure, then you may not have a normal bowel movement for a few days.  DIET: Your first meal following the procedure should be a light meal and then it is ok to progress to your normal diet.  A half-sandwich or bowl of soup is an example of a good first meal.  Heavy or fried foods are harder to digest and may make you feel nauseous or bloated.  Likewise meals heavy in dairy and vegetables can cause extra gas to form and this can also increase the bloating.  Drink plenty of fluids but you should avoid alcoholic beverages for 24 hours.  ACTIVITY: Your care partner should take you home directly after the procedure.  You should plan to take it easy, moving slowly for the rest of the day.  You can resume normal activity the day after the procedure however you should NOT DRIVE or use heavy machinery for 24 hours (because of the sedation medicines used during the test).    SYMPTOMS TO REPORT IMMEDIATELY: A gastroenterologist can be reached at any hour.  During normal business  hours, 8:30 AM to 5:00 PM Monday through Friday, call [phone].  After hours and on weekends, please call the GI answering service at [phone] who will take a message and have the physician on call contact you.   Following lower endoscopy (colonoscopy or flexible sigmoidoscopy):  Excessive amounts of blood in the stool  Significant tenderness or worsening of abdominal pains  Swelling of the abdomen that is new, acute  FOLLOW UP: If any biopsies were taken you will be contacted by phone or by letter within the next 1-3 weeks.  Call your gastroenterologist if you have not heard about the biopsies in 3 weeks.  Our staff will call the home number listed on your records the next business day following your procedure to check on you and address any questions or concerns that you may have at that time regarding the information given to you following your procedure. This is a courtesy call and so if there is no answer at the home number and we have not heard from you through the emergency physician on call, we will assume that you have returned to your regular daily activities without incident.  SIGNATURES/CONFIDENTIALITY: You and/or your care partner have signed paperwork which will be entered into your electronic medical record.  These signatures attest to the fact that that the information above on your After Visit Summary has been reviewed and is understood.  Full responsibility of the confidentiality of  this discharge information lies with you and/or your care-partner.

## 2013-07-23 NOTE — Op Note (Signed)
[facility]  [address] [place], [id number]   COLONOSCOPY PROCEDURE REPORT  PATIENT: [name], [name]  MR#: [id number] BIRTHDATE: 25-Sep-1945 , 67  yrs. old GENDER: Male ENDOSCOPIST: Eustace Quail, MD REFERRED [id number] Program Recall PROCEDURE DATE:  07/23/2013 PROCEDURE:   Colonoscopy, surveillance First Screening Colonoscopy - Avg.  risk and is 50 yrs.  old or older - No.  Prior Negative Screening - Now for repeat screening. N/A  History of Adenoma - Now for follow-up colonoscopy & has been > or = to 3 yrs.  Yes hx of adenoma.  Has been 3 or more years since last colonoscopy.  Polyps Removed Today? No.  Recommend repeat exam, <10 yrs? Yes.  High risk (family or personal hx). ASA CLASS:   Class II INDICATIONS:Patient's personal history of adenomatous colon polyps. Index 2004 (TAs); 2006 (-); 06-2010 (4 small adenomas) MEDICATIONS: MAC sedation, administered by CRNA and propofol (Diprivan) 150mg  IV  DESCRIPTION OF PROCEDURE:   After the risks benefits and alternatives of the procedure were thoroughly explained, informed consent was obtained.  A digital rectal exam revealed no abnormalities of the rectum.   The LB [id number]  endoscope was introduced through the anus and advanced to the cecum, which was identified by both the appendix and ileocecal valve. No adverse events experienced.   The quality of the prep was excellent, using MoviPrep  The instrument was then slowly withdrawn as the colon was fully examined.   COLON FINDINGS: Moderate diverticulosis was noted The finding was in the left colon.   The colon mucosa was otherwise normal. Retroflexed views revealed internal hemorrhoids. The time to cecum=1 minutes 55 seconds.  Withdrawal time=7 minutes 55 seconds. The scope was withdrawn and the procedure completed. COMPLICATIONS: There were no complications.  ENDOSCOPIC IMPRESSION: 1.   Moderate diverticulosis was noted in the left colon 2.    The colon mucosa was otherwise normal  RECOMMENDATIONS: 1. Follow up colonoscopy in 5 years (hx multiple adenomas)   eSigned:  Eustace Quail, MD 07/23/2013 2:52 PM   cc: Domenick Gong, MD and The Patient

## 2013-07-26 ENCOUNTER — Telehealth: Payer: Self-pay | Admitting: *Deleted

## 2013-07-26 NOTE — Telephone Encounter (Signed)
  Follow up Call-  Call back number 07/23/2013  Post procedure Call Back phone  # [phone]  Permission to leave phone message Yes     Patient questions:  Do you have a fever, pain , or abdominal swelling? no Pain Score  0 *  Have you tolerated food without any problems? yes  Have you been able to return to your normal activities? yes  Do you have any questions about your discharge instructions: Diet   no Medications  no Follow up visit  no  Do you have questions or concerns about your Care? no  Actions: * If pain score is 4 or above: No action needed, pain <4.

## 2013-07-27 ENCOUNTER — Encounter (HOSPITAL_COMMUNITY): Payer: Medicare HMO

## 2013-08-03 ENCOUNTER — Encounter (HOSPITAL_COMMUNITY): Payer: Medicare HMO

## 2013-08-11 ENCOUNTER — Ambulatory Visit (HOSPITAL_COMMUNITY)
Admission: RE | Admit: 2013-08-11 | Discharge: 2013-08-11 | Disposition: A | Discharge status: Home / Self Care | Payer: Medicare HMO | Source: Ambulatory Visit | Attending: Cardiology | Admitting: Cardiology

## 2013-08-11 DIAGNOSIS — I10 Essential (primary) hypertension: Secondary | ICD-10-CM | POA: Insufficient documentation

## 2013-08-11 DIAGNOSIS — E785 Hyperlipidemia, unspecified: Secondary | ICD-10-CM | POA: Insufficient documentation

## 2013-09-21 ENCOUNTER — Other Ambulatory Visit: Payer: Self-pay

## 2013-09-21 MED ORDER — BENAZEPRIL HCL 20 MG PO TABS: 40.0000 mg | ORAL_TABLET | Freq: Every day | ORAL | Status: DC

## 2014-04-11 DIAGNOSIS — N401 Enlarged prostate with lower urinary tract symptoms: Secondary | ICD-10-CM | POA: Insufficient documentation

## 2014-07-11 ENCOUNTER — Ambulatory Visit (INDEPENDENT_AMBULATORY_CARE_PROVIDER_SITE_OTHER): Payer: PPO | Admitting: Cardiology

## 2014-07-11 ENCOUNTER — Encounter: Payer: Self-pay | Admitting: Cardiology

## 2014-07-11 VITALS — BP 130/86 | HR 77 | Ht 70.0 in | Wt 242.3 lb

## 2014-07-11 DIAGNOSIS — I10 Essential (primary) hypertension: Secondary | ICD-10-CM

## 2014-07-11 DIAGNOSIS — E785 Hyperlipidemia, unspecified: Secondary | ICD-10-CM

## 2014-07-11 MED ORDER — FENOFIBRATE 160 MG PO TABS: 160.0000 mg | ORAL_TABLET | Freq: Every day | ORAL | Status: DC

## 2014-07-11 MED ORDER — EZETIMIBE 10 MG PO TABS: 10.0000 mg | ORAL_TABLET | Freq: Every day | ORAL | Status: DC

## 2014-07-11 NOTE — Progress Notes (Signed)
[name] Date of Birth: 1946-01-05 Medical Record #[id number]  History of Present Illness: Theo is seen for yearly follow up of HTN and hyperlipidemia. He has multiple drug intolerances. He is doing well on benazepril. Blood pressure has been controlled. No chest pain. He had a normal ETT in March 2015. He complains of a lot of back pain, leg weakness, and arthralgias in his knees. He is seeing a Publishing rights manager at [facility].   Current Outpatient Prescriptions on File Prior to Visit  Medication Sig Dispense Refill  . aspirin 81 MG tablet Take 81 mg by mouth daily.      . benazepril (LOTENSIN) 20 MG tablet Take 2 tablets (40 mg total) by mouth daily. 180 tablet 3  . butalbital-acetaminophen-caffeine (ESGIC PLUS) 50-500-40 MG per tablet Take 1-2 tablets by mouth every 4 (four) hours as needed.      . folic acid (FOLVITE) 768 MCG tablet Take 800 mcg by mouth daily.      . multivitamin (THERAGRAN) per tablet Take 1 tablet by mouth daily.      . naproxen (NAPROSYN) 500 MG tablet Take 1 tablet (500 mg total) by mouth 2 (two) times daily with a meal. 180 tablet 3   No current facility-administered medications on file prior to visit.    Allergies  Allergen Reactions  . Toprol Xl [Metoprolol Succinate] Other (See Comments)    Pt states is " like a govenor on a bus"   . Amoxicillin     REACTION: hives  . Atorvastatin     REACTION: Weakness in muscles  . Codeine     Pt does not have allergy to codeine but father had severe reaction to codeine so he never wants to be given to him  . Statins     Weakness in muscles    Past Medical History  Diagnosis Date  . Hypertension   . Asthma   . Hyperlipidemia   . Osteoarthritis   . Depression   . Sleep apnea   . Colon polyps   . Sigmoid diverticulosis   . Epigastric pain   . Nausea     Occasional  . Knee pain     Past Surgical History  Procedure Laterality Date  . Colonoscopy   Nov 06, 2004  . Hernia repair    . Appendectomy    .  Polypectomy    . Tonsillectomy      History  Smoking status  . Former Smoker  . Types: Cigarettes  . Quit date: 04/29/1983  Smokeless tobacco  . Never Used    History  Alcohol Use  . Yes    Comment: occ alcohol    Family History  Problem Relation Age of Onset  . Cancer Maternal Grandfather     Colon Cancer  . Colon cancer Maternal Grandfather   . Heart disease Father     Review of Systems: As noted in HPI.  All other systems were reviewed and are negative.  Physical Exam: BP 130/86 mmHg  Pulse 77  Ht 5\' 10"  (1.778 m)  Wt 242 lb 4.8 oz (109.907 kg)  BMI 34.77 kg/m2 He is a WD WM in NAD. The HEENT exam is unremarkable.  The carotids are 2+ without bruits.  There is no thyromegaly.  There is no JVD.  The lungs are clear.  The heart exam reveals a regular rate with a normal S1 and S2.  There are no murmurs, gallops, or rubs.  The PMI is not displaced.  Abdominal exam reveals good bowel sounds.  There is no hepatosplenomegaly or tenderness.  There are no masses.  Exam of the legs reveal no clubbing, cyanosis, or edema.  The distal pulses are intact.  Cranial nerves II - XII are intact.   LABORATORY DATA: Ecg shows normal sinus rhythm with a normal ECG. Occ. PACs. I have personally reviewed and interpreted this study.    Assessment / Plan: 1. Hypertension. Well-controlled on benazepril. Continue same therapy.  2. Hyperlipidemia.  He has been intolerant to statins in the past.  I recommended lifestyle modifications with Mediterranean diet, weight loss, and regular aerobic exercise. Continue Zetia and fenofibrate 160 mg daily. He is scheduled for follow up lab work with Dr. Osborne Casco.  I will follow up in one year.

## 2014-07-11 NOTE — Patient Instructions (Signed)
Continue your current therapy  I will see you in one year   

## 2014-07-18 DIAGNOSIS — M51379 Other intervertebral disc degeneration, lumbosacral region without mention of lumbar back pain or lower extremity pain: Secondary | ICD-10-CM | POA: Insufficient documentation

## 2014-07-18 DIAGNOSIS — M545 Low back pain, unspecified: Secondary | ICD-10-CM | POA: Insufficient documentation

## 2014-07-18 DIAGNOSIS — M5137 Other intervertebral disc degeneration, lumbosacral region: Secondary | ICD-10-CM | POA: Insufficient documentation

## 2014-09-14 DIAGNOSIS — M48061 Spinal stenosis, lumbar region without neurogenic claudication: Secondary | ICD-10-CM | POA: Insufficient documentation

## 2014-09-28 DIAGNOSIS — M4310 Spondylolisthesis, site unspecified: Secondary | ICD-10-CM | POA: Insufficient documentation

## 2014-10-24 DIAGNOSIS — Z981 Arthrodesis status: Secondary | ICD-10-CM | POA: Insufficient documentation

## 2015-01-04 ENCOUNTER — Encounter: Payer: Self-pay | Admitting: General Surgery

## 2015-01-04 NOTE — Progress Notes (Addendum)
Will plan outpatient removal of right lower chest wall mass.

## 2015-01-23 ENCOUNTER — Encounter (HOSPITAL_BASED_OUTPATIENT_CLINIC_OR_DEPARTMENT_OTHER): Payer: Self-pay

## 2015-01-24 ENCOUNTER — Encounter (HOSPITAL_BASED_OUTPATIENT_CLINIC_OR_DEPARTMENT_OTHER)
Admission: RE | Admit: 2015-01-24 | Discharge: 2015-01-24 | Disposition: A | Discharge status: Home / Self Care | Payer: PPO | Source: Ambulatory Visit | Attending: General Surgery | Admitting: General Surgery

## 2015-01-24 DIAGNOSIS — E780 Pure hypercholesterolemia: Secondary | ICD-10-CM | POA: Diagnosis not present

## 2015-01-24 DIAGNOSIS — G4730 Sleep apnea, unspecified: Secondary | ICD-10-CM | POA: Diagnosis not present

## 2015-01-24 DIAGNOSIS — F329 Major depressive disorder, single episode, unspecified: Secondary | ICD-10-CM | POA: Diagnosis not present

## 2015-01-24 DIAGNOSIS — Z5181 Encounter for therapeutic drug level monitoring: Secondary | ICD-10-CM | POA: Diagnosis not present

## 2015-01-24 DIAGNOSIS — Z7982 Long term (current) use of aspirin: Secondary | ICD-10-CM | POA: Diagnosis not present

## 2015-01-24 DIAGNOSIS — E785 Hyperlipidemia, unspecified: Secondary | ICD-10-CM | POA: Diagnosis not present

## 2015-01-24 DIAGNOSIS — K219 Gastro-esophageal reflux disease without esophagitis: Secondary | ICD-10-CM | POA: Diagnosis not present

## 2015-01-24 DIAGNOSIS — J45909 Unspecified asthma, uncomplicated: Secondary | ICD-10-CM | POA: Diagnosis not present

## 2015-01-24 DIAGNOSIS — I10 Essential (primary) hypertension: Secondary | ICD-10-CM | POA: Diagnosis not present

## 2015-01-24 DIAGNOSIS — M1990 Unspecified osteoarthritis, unspecified site: Secondary | ICD-10-CM | POA: Diagnosis not present

## 2015-01-24 DIAGNOSIS — Z87891 Personal history of nicotine dependence: Secondary | ICD-10-CM | POA: Diagnosis not present

## 2015-01-24 DIAGNOSIS — R222 Localized swelling, mass and lump, trunk: Secondary | ICD-10-CM | POA: Diagnosis present

## 2015-01-24 DIAGNOSIS — K5730 Diverticulosis of large intestine without perforation or abscess without bleeding: Secondary | ICD-10-CM | POA: Diagnosis not present

## 2015-01-24 DIAGNOSIS — Z79899 Other long term (current) drug therapy: Secondary | ICD-10-CM | POA: Diagnosis not present

## 2015-01-24 DIAGNOSIS — D213 Benign neoplasm of connective and other soft tissue of thorax: Secondary | ICD-10-CM | POA: Diagnosis not present

## 2015-01-24 LAB — PROTIME-INR
INR: 1.07 (ref 0.00–1.49)
PROTHROMBIN TIME: 14.1 s (ref 11.6–15.2)

## 2015-01-24 LAB — COMPREHENSIVE METABOLIC PANEL
ALT: 25 U/L (ref 17–63)
ANION GAP: 9 (ref 5–15)
AST: 25 U/L (ref 15–41)
Albumin: 3.9 g/dL (ref 3.5–5.0)
Alkaline Phosphatase: 51 U/L (ref 38–126)
BILIRUBIN TOTAL: 0.5 mg/dL (ref 0.3–1.2)
BUN: 22 mg/dL — ABNORMAL HIGH (ref 6–20)
CO2: 25 mmol/L (ref 22–32)
Calcium: 9.4 mg/dL (ref 8.9–10.3)
Chloride: 104 mmol/L (ref 101–111)
Creatinine, Ser: 1.17 mg/dL (ref 0.61–1.24)
GFR calc Af Amer: >60 mL/min (ref >60)
Glucose, Bld: 102 mg/dL — ABNORMAL HIGH (ref 65–99)
Potassium: 4 mmol/L (ref 3.5–5.1)
Sodium: 138 mmol/L (ref 135–145)
TOTAL PROTEIN: 6.4 g/dL — AB (ref 6.5–8.1)

## 2015-01-24 LAB — CBC WITH DIFFERENTIAL/PLATELET
BASOS ABS: 0 10*3/uL (ref 0.0–0.1)
Basophils Relative: 0 % (ref 0–1)
Eosinophils Absolute: 0.2 10*3/uL (ref 0.0–0.7)
Eosinophils Relative: 3 % (ref 0–5)
HEMATOCRIT: 42.5 % (ref 39.0–52.0)
Hemoglobin: 14.3 g/dL (ref 13.0–17.0)
LYMPHS ABS: 2.1 10*3/uL (ref 0.7–4.0)
LYMPHS PCT: 27 % (ref 12–46)
MCH: 29.2 pg (ref 26.0–34.0)
MCHC: 33.6 g/dL (ref 30.0–36.0)
MCV: 86.9 fL (ref 78.0–100.0)
MONO ABS: 0.6 10*3/uL (ref 0.1–1.0)
MONOS PCT: 8 % (ref 3–12)
Neutro Abs: 4.7 10*3/uL (ref 1.7–7.7)
Neutrophils Relative %: 62 % (ref 43–77)
Platelets: 275 10*3/uL (ref 150–400)
RBC: 4.89 MIL/uL (ref 4.22–5.81)
RDW: 14.5 % (ref 11.5–15.5)
WBC: 7.6 10*3/uL (ref 4.0–10.5)

## 2015-01-26 ENCOUNTER — Ambulatory Visit (HOSPITAL_BASED_OUTPATIENT_CLINIC_OR_DEPARTMENT_OTHER)
Admission: RE | Admit: 2015-01-26 | Discharge: 2015-01-26 | Disposition: A | Discharge status: Home / Self Care | Payer: PPO | Source: Ambulatory Visit | Attending: General Surgery | Admitting: General Surgery

## 2015-01-26 ENCOUNTER — Encounter (HOSPITAL_BASED_OUTPATIENT_CLINIC_OR_DEPARTMENT_OTHER): Payer: Self-pay | Admitting: Certified Registered"

## 2015-01-26 ENCOUNTER — Ambulatory Visit (HOSPITAL_BASED_OUTPATIENT_CLINIC_OR_DEPARTMENT_OTHER): Payer: PPO | Admitting: Anesthesiology

## 2015-01-26 ENCOUNTER — Encounter (HOSPITAL_BASED_OUTPATIENT_CLINIC_OR_DEPARTMENT_OTHER)
Admission: RE | Disposition: A | Discharge status: Home / Self Care | Payer: Self-pay | Source: Ambulatory Visit | Attending: General Surgery

## 2015-01-26 DIAGNOSIS — I10 Essential (primary) hypertension: Secondary | ICD-10-CM | POA: Insufficient documentation

## 2015-01-26 DIAGNOSIS — Z5181 Encounter for therapeutic drug level monitoring: Secondary | ICD-10-CM | POA: Insufficient documentation

## 2015-01-26 DIAGNOSIS — E780 Pure hypercholesterolemia: Secondary | ICD-10-CM | POA: Insufficient documentation

## 2015-01-26 DIAGNOSIS — K5730 Diverticulosis of large intestine without perforation or abscess without bleeding: Secondary | ICD-10-CM | POA: Insufficient documentation

## 2015-01-26 DIAGNOSIS — D213 Benign neoplasm of connective and other soft tissue of thorax: Secondary | ICD-10-CM | POA: Diagnosis not present

## 2015-01-26 DIAGNOSIS — Z7982 Long term (current) use of aspirin: Secondary | ICD-10-CM | POA: Insufficient documentation

## 2015-01-26 DIAGNOSIS — J45909 Unspecified asthma, uncomplicated: Secondary | ICD-10-CM | POA: Diagnosis not present

## 2015-01-26 DIAGNOSIS — Z79899 Other long term (current) drug therapy: Secondary | ICD-10-CM | POA: Insufficient documentation

## 2015-01-26 DIAGNOSIS — K219 Gastro-esophageal reflux disease without esophagitis: Secondary | ICD-10-CM | POA: Insufficient documentation

## 2015-01-26 DIAGNOSIS — F329 Major depressive disorder, single episode, unspecified: Secondary | ICD-10-CM | POA: Insufficient documentation

## 2015-01-26 DIAGNOSIS — E785 Hyperlipidemia, unspecified: Secondary | ICD-10-CM | POA: Insufficient documentation

## 2015-01-26 DIAGNOSIS — G4730 Sleep apnea, unspecified: Secondary | ICD-10-CM | POA: Insufficient documentation

## 2015-01-26 DIAGNOSIS — M1990 Unspecified osteoarthritis, unspecified site: Secondary | ICD-10-CM | POA: Insufficient documentation

## 2015-01-26 DIAGNOSIS — Z87891 Personal history of nicotine dependence: Secondary | ICD-10-CM | POA: Insufficient documentation

## 2015-01-26 HISTORY — PX: MASS EXCISION: SHX2000

## 2015-01-26 SURGERY — EXCISION MASS
Anesthesia: General | Site: Chest | Laterality: Right

## 2015-01-26 MED ORDER — OXYCODONE HCL 5 MG PO TABS: 5.0000 mg | ORAL_TABLET | ORAL | Status: DC | PRN

## 2015-01-26 MED ORDER — METHYLPREDNISOLONE ACETATE 40 MG/ML IJ SUSP
INTRAMUSCULAR | Status: AC
  Filled 2015-01-26: qty 1

## 2015-01-26 MED ORDER — OXYCODONE HCL 5 MG/5ML PO SOLN: 5.0000 mg | Freq: Once | ORAL | Status: DC | PRN

## 2015-01-26 MED ORDER — LIDOCAINE-EPINEPHRINE 0.5 %-1:200000 IJ SOLN
INTRAMUSCULAR | Status: AC
  Filled 2015-01-26: qty 1

## 2015-01-26 MED ORDER — DEXAMETHASONE SODIUM PHOSPHATE 4 MG/ML IJ SOLN
INTRAMUSCULAR | Status: DC | PRN
  Administered 2015-01-26: 10 mg via INTRAVENOUS

## 2015-01-26 MED ORDER — HYDROMORPHONE HCL 1 MG/ML IJ SOLN: 0.2500 mg | INTRAMUSCULAR | Status: DC | PRN

## 2015-01-26 MED ORDER — LIDOCAINE HCL (CARDIAC) 20 MG/ML IV SOLN
INTRAVENOUS | Status: DC | PRN
  Administered 2015-01-26: 100 mg via INTRAVENOUS

## 2015-01-26 MED ORDER — ONDANSETRON HCL 4 MG/2ML IJ SOLN
INTRAMUSCULAR | Status: DC | PRN
  Administered 2015-01-26: 4 mg via INTRAVENOUS

## 2015-01-26 MED ORDER — GLYCOPYRROLATE 0.2 MG/ML IJ SOLN: 0.2000 mg | Freq: Once | INTRAMUSCULAR | Status: DC | PRN

## 2015-01-26 MED ORDER — METHYLPREDNISOLONE ACETATE 80 MG/ML IJ SUSP
INTRAMUSCULAR | Status: AC
  Filled 2015-01-26: qty 1

## 2015-01-26 MED ORDER — LIDOCAINE-EPINEPHRINE 1 %-1:100000 IJ SOLN
INTRAMUSCULAR | Status: AC
  Filled 2015-01-26: qty 1

## 2015-01-26 MED ORDER — OXYCODONE HCL 5 MG PO TABS: 5.0000 mg | ORAL_TABLET | Freq: Once | ORAL | Status: DC | PRN

## 2015-01-26 MED ORDER — BUPIVACAINE HCL (PF) 0.5 % IJ SOLN
INTRAMUSCULAR | Status: AC
  Filled 2015-01-26: qty 60

## 2015-01-26 MED ORDER — MEPERIDINE HCL 25 MG/ML IJ SOLN: 6.2500 mg | INTRAMUSCULAR | Status: DC | PRN

## 2015-01-26 MED ORDER — SODIUM BICARBONATE 4 % IV SOLN
INTRAVENOUS | Status: AC
  Filled 2015-01-26: qty 5

## 2015-01-26 MED ORDER — MIDAZOLAM HCL 2 MG/2ML IJ SOLN
INTRAMUSCULAR | Status: AC
  Filled 2015-01-26: qty 2

## 2015-01-26 MED ORDER — PROPOFOL 10 MG/ML IV BOLUS
INTRAVENOUS | Status: DC | PRN
  Administered 2015-01-26: 200 mg via INTRAVENOUS

## 2015-01-26 MED ORDER — VANCOMYCIN HCL IN DEXTROSE 1-5 GM/200ML-% IV SOLN
INTRAVENOUS | Status: AC
  Filled 2015-01-26: qty 200

## 2015-01-26 MED ORDER — SUCCINYLCHOLINE CHLORIDE 20 MG/ML IJ SOLN
INTRAMUSCULAR | Status: AC
  Filled 2015-01-26: qty 1

## 2015-01-26 MED ORDER — BUPIVACAINE-EPINEPHRINE (PF) 0.5% -1:200000 IJ SOLN
INTRAMUSCULAR | Status: AC
  Filled 2015-01-26: qty 60

## 2015-01-26 MED ORDER — SODIUM CHLORIDE 0.9 % IJ SOLN
INTRAMUSCULAR | Status: AC
  Filled 2015-01-26: qty 20

## 2015-01-26 MED ORDER — PROMETHAZINE HCL 25 MG/ML IJ SOLN
6.2500 mg | INTRAMUSCULAR | Status: DC | PRN
Start: 2015-01-26 — End: 2015-01-26

## 2015-01-26 MED ORDER — PROPOFOL 500 MG/50ML IV EMUL
INTRAVENOUS | Status: AC
  Filled 2015-01-26: qty 50

## 2015-01-26 MED ORDER — BUPIVACAINE HCL (PF) 0.25 % IJ SOLN
INTRAMUSCULAR | Status: AC
  Filled 2015-01-26: qty 30

## 2015-01-26 MED ORDER — LACTATED RINGERS IV SOLN
INTRAVENOUS | Status: DC
  Administered 2015-01-26: 09:00:00 via INTRAVENOUS
  Administered 2015-01-26: 10 mL/h via INTRAVENOUS

## 2015-01-26 MED ORDER — FENTANYL CITRATE (PF) 100 MCG/2ML IJ SOLN
50.0000 ug | INTRAMUSCULAR | Status: DC | PRN
  Administered 2015-01-26: 25 ug via INTRAVENOUS
  Administered 2015-01-26: 50 ug via INTRAVENOUS

## 2015-01-26 MED ORDER — FENTANYL CITRATE (PF) 100 MCG/2ML IJ SOLN
INTRAMUSCULAR | Status: AC
  Filled 2015-01-26: qty 4

## 2015-01-26 MED ORDER — VANCOMYCIN HCL IN DEXTROSE 1-5 GM/200ML-% IV SOLN
1000.0000 mg | INTRAVENOUS | Status: AC
  Administered 2015-01-26: 1000 mg via INTRAVENOUS

## 2015-01-26 MED ORDER — BUPIVACAINE-EPINEPHRINE 0.5% -1:200000 IJ SOLN
INTRAMUSCULAR | Status: DC | PRN
Start: 2015-01-26 — End: 2015-01-26
  Administered 2015-01-26: 10 mL

## 2015-01-26 MED ORDER — MIDAZOLAM HCL 2 MG/2ML IJ SOLN
1.0000 mg | INTRAMUSCULAR | Status: DC | PRN
  Administered 2015-01-26: 1 mg via INTRAVENOUS

## 2015-01-26 MED ORDER — EPHEDRINE SULFATE 50 MG/ML IJ SOLN
INTRAMUSCULAR | Status: DC | PRN
  Administered 2015-01-26 (×2): 10 mg via INTRAVENOUS

## 2015-01-26 SURGICAL SUPPLY — 47 items
APL SKNCLS STERI-STRIP NONHPOA (GAUZE/BANDAGES/DRESSINGS)
BENZOIN TINCTURE PRP APPL 2/3 (GAUZE/BANDAGES/DRESSINGS) IMPLANT
BLADE CLIPPER SURG (BLADE) ×2 IMPLANT
BLADE SURG 10 STRL SS (BLADE) ×3 IMPLANT
BLADE SURG 15 STRL LF DISP TIS (BLADE) IMPLANT
BLADE SURG 15 STRL SS (BLADE) ×3
CANISTER SUCT 1200ML W/VALVE (MISCELLANEOUS) IMPLANT
CHLORAPREP W/TINT 26ML (MISCELLANEOUS) ×3 IMPLANT
CLEANER CAUTERY TIP 5X5 PAD (MISCELLANEOUS) ×1 IMPLANT
CLOSURE WOUND 1/2 X4 (GAUZE/BANDAGES/DRESSINGS)
COVER BACK TABLE 60X90IN (DRAPES) ×3 IMPLANT
COVER MAYO STAND STRL (DRAPES) ×3 IMPLANT
DECANTER SPIKE VIAL GLASS SM (MISCELLANEOUS) IMPLANT
DRAPE LAPAROTOMY 100X72 PEDS (DRAPES) ×3 IMPLANT
DRSG TEGADERM 4X4.75 (GAUZE/BANDAGES/DRESSINGS) IMPLANT
ELECT REM PT RETURN 9FT ADLT (ELECTROSURGICAL) ×3
ELECTRODE REM PT RTRN 9FT ADLT (ELECTROSURGICAL) ×1 IMPLANT
GAUZE SPONGE 4X4 16PLY XRAY LF (GAUZE/BANDAGES/DRESSINGS) IMPLANT
GLOVE BIO SURGEON STRL SZ 6.5 (GLOVE) ×1 IMPLANT
GLOVE BIO SURGEONS STRL SZ 6.5 (GLOVE) ×1
GLOVE BIOGEL PI IND STRL 7.0 (GLOVE) IMPLANT
GLOVE BIOGEL PI IND STRL 8.5 (GLOVE) ×1 IMPLANT
GLOVE BIOGEL PI INDICATOR 7.0 (GLOVE) ×2
GLOVE BIOGEL PI INDICATOR 8.5 (GLOVE) ×2
GLOVE ECLIPSE 8.0 STRL XLNG CF (GLOVE) ×3 IMPLANT
GOWN STRL REUS W/ TWL LRG LVL3 (GOWN DISPOSABLE) ×2 IMPLANT
GOWN STRL REUS W/TWL LRG LVL3 (GOWN DISPOSABLE) ×9
LIQUID BAND (GAUZE/BANDAGES/DRESSINGS) ×3 IMPLANT
NEEDLE HYPO 25X1 1.5 SAFETY (NEEDLE) ×3 IMPLANT
NS IRRIG 1000ML POUR BTL (IV SOLUTION) ×2 IMPLANT
PACK BASIN DAY SURGERY FS (CUSTOM PROCEDURE TRAY) ×3 IMPLANT
PAD CLEANER CAUTERY TIP 5X5 (MISCELLANEOUS) ×2
PENCIL BUTTON HOLSTER BLD 10FT (ELECTRODE) ×3 IMPLANT
SPONGE GAUZE 4X4 12PLY STER LF (GAUZE/BANDAGES/DRESSINGS) IMPLANT
STRIP CLOSURE SKIN 1/2X4 (GAUZE/BANDAGES/DRESSINGS) IMPLANT
SUT MON AB 4-0 PC3 18 (SUTURE) IMPLANT
SUT PROLENE 2 0 CT2 30 (SUTURE) IMPLANT
SUT VIC AB 2-0 SH 27 (SUTURE) ×3
SUT VIC AB 2-0 SH 27XBRD (SUTURE) IMPLANT
SUT VIC AB 4-0 SH 18 (SUTURE) IMPLANT
SUT VIC AB 4-0 SH 27 (SUTURE)
SUT VIC AB 4-0 SH 27XANBCTRL (SUTURE) IMPLANT
SYR CONTROL 10ML LL (SYRINGE) ×3 IMPLANT
TOWEL OR 17X24 6PK STRL BLUE (TOWEL DISPOSABLE) ×6 IMPLANT
TUBE CONNECTING 20'X1/4 (TUBING)
TUBE CONNECTING 20X1/4 (TUBING) IMPLANT
YANKAUER SUCT BULB TIP NO VENT (SUCTIONS) IMPLANT

## 2015-01-26 NOTE — Transfer of Care (Signed)
Immediate Anesthesia Transfer of Care Note  Patient: [name]  Procedure(s) Performed: Procedure(s): REMOVAL OF RIGHT CHEST WALL MASS (Right)  Patient Location: PACU  Anesthesia Type:General  Level of Consciousness: awake, alert  and oriented  Airway & Oxygen Therapy: Patient Spontanous Breathing and Patient connected to face mask oxygen  Post-op Assessment: Report given to RN, Post -op Vital signs reviewed and stable and Patient moving all extremities  Post vital signs: Reviewed and stable  Last Vitals:  Filed Vitals:   01/26/15 0733  BP: 153/84  Pulse: 68  Temp: 36.9 C  Resp: 18    Complications: No apparent anesthesia complications

## 2015-01-26 NOTE — H&P (Signed)
[name]. [facility] Location: [facility] Surgery Patient #: [id number] DOB: 1945/10/30 Married / Language: English / Race: White Male  History of Present Illness Clifford Hollingshead MD; 01/04/2015 11:05 AM) Patient words: chest wall mass.  The patient is a 69 year old male   Note:He is self referred. He called my house and spoke with my wife about a mass in his "chest plate" that he noticed and was getting larger. We called him and made him an office appointment. There is no pain with the mass. He states its in his lower right chest wall. He had gained quite a bit of weight but now has been losing weight. He is here with his wife.   Other Problems Elbert Ewings, CMA; 01/04/2015 10:28 AM) Arthritis Hypercholesterolemia Lump In Breast  Past Surgical History Elbert Ewings, CMA; 01/04/2015 10:28 AM) Appendectomy Laparoscopic Inguinal Hernia Surgery Left. Spinal Surgery - Lower Back  Diagnostic Studies History Elbert Ewings, [place]; 01/04/2015 10:28 AM) Colonoscopy 1-5 years ago  Medication History Elbert Ewings, CMA; 01/04/2015 10:31 AM) Aspirin (81MG  Tablet DR, Oral as needed) Active. Benazepril HCl (40MG  Tablet, Oral daily) Active. Butalbital-APAP-Caffeine (50-500-40MG  Tablet, Oral) Active. Zetia (10MG  Tablet, Oral) Active. Fenofibrate (160MG  Tablet, Oral) Active. Folic Acid (378HYI Tablet, Oral) Active. Multiple Vitamin (Oral) Active. Naproxen DR (500MG  Tablet DR, Oral) Active. CeleBREX (200MG  Capsule, Oral daily) Active. Medications Reconciled  Social History Elbert Ewings, [place]; 01/04/2015 10:28 AM) Alcohol use Occasional alcohol use. No caffeine use No drug use Tobacco use Former smoker.  Family History Elbert Ewings, [place]; 01/04/2015 10:28 AM) Arthritis Father, Sister. Heart Disease Father. Hypertension Father, Mother. Prostate Cancer Father.  Review of Systems Elbert Ewings CMA; 01/04/2015 10:28 AM) General Not Present- Appetite Loss, Chills,  Fatigue, Fever, Night Sweats, Weight Gain and Weight Loss. Skin Not Present- Change in Wart/Mole, Dryness, Hives, Jaundice, New Lesions, Non-Healing Wounds, Rash and Ulcer. HEENT Not Present- Earache, Hearing Loss, Hoarseness, Nose Bleed, Oral Ulcers, Ringing in the Ears, Seasonal Allergies, Sinus Pain, Sore Throat, Visual Disturbances, Wears glasses/contact lenses and Yellow Eyes. Respiratory Not Present- Bloody sputum, Chronic Cough, Difficulty Breathing, Snoring and Wheezing. Breast Present- Breast Mass. Not Present- Breast Pain, Nipple Discharge and Skin Changes. Cardiovascular Not Present- Chest Pain, Difficulty Breathing Lying Down, Leg Cramps, Palpitations, Rapid Heart Rate, Shortness of Breath and Swelling of Extremities. Gastrointestinal Not Present- Abdominal Pain, Bloating, Bloody Stool, Change in Bowel Habits, Chronic diarrhea, Constipation, Difficulty Swallowing, Excessive gas, Gets full quickly at meals, Hemorrhoids, Indigestion, Nausea, Rectal Pain and Vomiting. Male Genitourinary Not Present- Blood in Urine, Change in Urinary Stream, Frequency, Impotence, Nocturia, Painful Urination, Urgency and Urine Leakage. Musculoskeletal Present- Muscle Weakness. Not Present- Back Pain, Joint Pain, Joint Stiffness, Muscle Pain and Swelling of Extremities. Neurological Present- Trouble walking. Not Present- Decreased Memory, Fainting, Headaches, Numbness, Seizures, Tingling, Tremor and Weakness. Psychiatric Not Present- Anxiety, Bipolar, Change in Sleep Pattern, Depression, Fearful and Frequent crying. Endocrine Not Present- Cold Intolerance, Excessive Hunger, Hair Changes, Heat Intolerance, Hot flashes and New Diabetes. Hematology Not Present- Easy Bruising, Excessive bleeding, Gland problems, HIV and Persistent Infections.     Physical Exam Clifford Hollingshead MD; 01/04/2015 11:06 AM) The physical exam findings are as follows: Note:General: Overweight male in NAD. Pleasant and  cooperative.  CV: RRR, no murmur, no JVD.  CHEST: Breath sounds equal and clear. Respirations nonlabored. 6 cm long by 3.5 cm wide subcutaneous soft tissue mass inferior aspect of right chest wall.  NEUROLOGIC: Alert and oriented, answers questions appropriately.  PSYCHIATRIC: Normal mood, affect ,  and behavior.    Assessment & Plan Clifford Hollingshead MD; 01/04/2015 11:05 AM) SUBCUTANEOUS MASS (782.2  R22.9) Impression: This is in the right lower chest wall area. He states been getting larger. He did gain a lot of weight because of back problems but has slowly begun to lose that. Differential diagnosis includes lipoma versus undefined neoplasm. I taught him and his wife how to measure the mass to make sure it is not getting significantly larger in the interim.  Plan: I recommended excision under general anesthesia. We discussed the procedure, the rationale, and the risks. The risks include but are not limited to bleeding, infection, wound healing problems, anesthesia, and recurrence. I've asked him to stop his aspirin 5 days before the surgery. I did explain to him that this mass was rapidly enlarging between now and the date of surgery, to call us back. He may need to present back to the office for a large core needle biopsy to make sure this is not a sarcoma  Jackolyn Confer, MD

## 2015-01-26 NOTE — Discharge Instructions (Addendum)
No heavy activity for one week.  Apply ice to the area as much as possible the next 3 days  May shower tomorrow.  Remove bandage Sunday.  Leave steri strips on.  Call for heavy bleeding or signs of infection.  Return to office in 2-3 weeks for a check up.  Call to make an appointment, [facility] Instructions  Activity: Get plenty of rest for the remainder of the day. A responsible adult should stay with you for 24 hours following the procedure.  For the next 24 hours, DO NOT: -Drive a car -Paediatric nurse -Drink alcoholic beverages -Take any medication unless instructed by your physician -Make any legal decisions or sign important papers.  Meals: Start with liquid foods such as gelatin or soup. Progress to regular foods as tolerated. Avoid greasy, spicy, heavy foods. If nausea and/or vomiting occur, drink only clear liquids until the nausea and/or vomiting subsides. Call your physician if vomiting continues.  Special Instructions/Symptoms: Your throat may feel dry or sore from the anesthesia or the breathing tube placed in your throat during surgery. If this causes discomfort, gargle with warm salt water. The discomfort should disappear within 24 hours.  If you had a scopolamine patch placed behind your ear for the management of post- operative nausea and/or vomiting:  1. The medication in the patch is effective for 72 hours, after which it should be removed.  Wrap patch in a tissue and discard in the trash. Wash hands thoroughly with soap and water. 2. You may remove the patch earlier than 72 hours if you experience unpleasant side effects which may include dry mouth, dizziness or visual disturbances. 3. Avoid touching the patch. Wash your hands with soap and water after contact with the patch.

## 2015-01-26 NOTE — Anesthesia Postprocedure Evaluation (Signed)
Anesthesia Post Note  Patient: [name]  Procedure(s) Performed: Procedure(s) (LRB): REMOVAL OF RIGHT CHEST WALL MASS (Right)  Anesthesia type: General  Patient location: PACU  Post pain: Pain level controlled  Post assessment: Post-op Vital signs reviewed  Last Vitals: BP 126/87 mmHg  Pulse 62  Temp(Src) 36.7 C (Oral)  Resp 18  Ht 5\' 10"  (1.778 m)  Wt 235 lb (106.595 kg)  BMI 33.72 kg/m2  SpO2 100%  Post vital signs: Reviewed  Level of consciousness: sedated  Complications: No apparent anesthesia complications

## 2015-01-26 NOTE — Anesthesia Preprocedure Evaluation (Signed)
Anesthesia Evaluation  Patient identified by MRN, date of birth, ID band Patient awake    Reviewed: Allergy & Precautions, NPO status , Patient's Chart, lab work & pertinent test results  Airway Mallampati: II  TM Distance: >3 FB Neck ROM: Full    Dental no notable dental hx.    Pulmonary neg pulmonary ROS, shortness of breath, asthma , sleep apnea , former smoker,  breath sounds clear to auscultation  Pulmonary exam normal       Cardiovascular hypertension, Pt. on medications Normal cardiovascular examRhythm:Regular Rate:Normal     Neuro/Psych negative neurological ROS  negative psych ROS   GI/Hepatic Neg liver ROS, GERD-  ,  Endo/Other  negative endocrine ROS  Renal/GU negative Renal ROS     Musculoskeletal  (+) Arthritis -,   Abdominal   Peds  Hematology negative hematology ROS (+)   Anesthesia Other Findings   Reproductive/Obstetrics negative OB ROS                             Anesthesia Physical Anesthesia Plan  ASA: II  Anesthesia Plan: General   Post-op Pain Management:    Induction: Intravenous  Airway Management Planned: LMA and Oral ETT  Additional Equipment:   Intra-op Plan:   Post-operative Plan: Extubation in OR  Informed Consent: I have reviewed the patients History and Physical, chart, labs and discussed the procedure including the risks, benefits and alternatives for the proposed anesthesia with the patient or authorized representative who has indicated his/her understanding and acceptance.   Dental advisory given  Plan Discussed with: CRNA  Anesthesia Plan Comments:         Anesthesia Quick Evaluation

## 2015-01-26 NOTE — Op Note (Signed)
Operative Note  [name] male 69 y.o. 01/26/2015  PREOPERATIVE DX:  Enlarging subcutaneous soft tissue mass of right lower chest wall  POSTOPERATIVE DX:  Same  PROCEDURE:   Excision of 7 cm subcutaneous soft tissue mass right lower chest wall         Surgeon: Odis Hollingshead   Assistants: Emily Filbert, PA-S  Anesthesia: General LMA anesthesia  Indications:   This is a 69 year old male who is noted a recently enlarging soft tissue mass in the right lower chest wall. No specific trauma to the area. No evidence of infection. He now presents for removal.    Procedure Detail:  He was seen in the holding area and my initials were placed over the mass. He is brought to the operating room placed supine on the operating table and given a general anesthetic. The hair in the right lower chest wall and upper abdomen was clipped. This area were sterilely prepped and draped. A timeout was performed.  Local anesthetic consisting of half percent Marcaine was infiltrated in the skin directly over the mass. Transverse incision is made through the skin and subcutaneous tissues. I identified what appeared to be a lobulated lipomatous mass above the fascia. Using electrocautery I excised this as well as some smaller lobules. It measured 7 cm in size. This was sent to pathology.  Following this I injected local anesthetic into the deep aspects of the wound. Bleeding was controlled with electrocautery. Once hemostasis was adequate, the subcutaneous tissue was reapproximated using a running 2-0 Vicryl suture. The skin was closed with running 3-0 Monocryl subcuticular stitch. Steri-Strips and sterile dressing were applied.  He tolerated the procedure well and a pair complications. He is taken the recovery room in satisfactory condition. He will be sent home later today.  Estimated Blood Loss:  less than 100 mL          Specimens: Right lower chest wall soft tissue mass        Complications:  * No  complications entered in OR log *         Disposition: PACU - hemodynamically stable.         Condition: stable

## 2015-01-26 NOTE — Interval H&P Note (Signed)
History and Physical Interval Note:  01/26/2015 7:59 AM  [name]  has presented today for surgery, with the diagnosis of Right Chest Wall Mass  The various methods of treatment have been discussed with the patient and family. After consideration of risks, benefits and other options for treatment, the patient has consented to  Procedure(s): REMOVAL OF RIGHT CHEST WALL MASS (Right) as a surgical intervention .  The patient's history has been reviewed, patient examined, no change in status, stable for surgery.  I have reviewed the patient's chart and labs.  Questions were answered to the patient's satisfaction.     Daisy Lites Lenna Sciara

## 2015-01-26 NOTE — Anesthesia Procedure Notes (Signed)
Procedure Name: LMA Insertion Date/Time: 01/26/2015 8:09 AM Performed by: Baxter Flattery Pre-anesthesia Checklist: Patient identified, Emergency Drugs available, Suction available and Patient being monitored Patient Re-evaluated:Patient Re-evaluated prior to inductionOxygen Delivery Method: Circle System Utilized Preoxygenation: Pre-oxygenation with 100% oxygen Intubation Type: IV induction Ventilation: Mask ventilation without difficulty LMA: LMA inserted LMA Size: 4.0 Number of attempts: 1 Airway Equipment and Method: Bite block Placement Confirmation: positive ETCO2 and breath sounds checked- equal and bilateral Tube secured with: Tape Dental Injury: Teeth and Oropharynx as per pre-operative assessment

## 2015-01-27 ENCOUNTER — Encounter (HOSPITAL_BASED_OUTPATIENT_CLINIC_OR_DEPARTMENT_OTHER): Payer: Self-pay | Admitting: General Surgery

## 2015-02-20 ENCOUNTER — Encounter: Payer: Self-pay | Admitting: Internal Medicine

## 2015-09-27 DIAGNOSIS — Z87891 Personal history of nicotine dependence: Secondary | ICD-10-CM | POA: Diagnosis not present

## 2015-09-27 DIAGNOSIS — Z981 Arthrodesis status: Secondary | ICD-10-CM | POA: Diagnosis not present

## 2015-09-27 DIAGNOSIS — Z09 Encounter for follow-up examination after completed treatment for conditions other than malignant neoplasm: Secondary | ICD-10-CM | POA: Diagnosis not present

## 2015-09-27 DIAGNOSIS — M47816 Spondylosis without myelopathy or radiculopathy, lumbar region: Secondary | ICD-10-CM | POA: Diagnosis not present

## 2015-09-27 DIAGNOSIS — M4316 Spondylolisthesis, lumbar region: Secondary | ICD-10-CM | POA: Diagnosis not present

## 2015-11-10 ENCOUNTER — Encounter: Payer: Self-pay | Admitting: Cardiology

## 2015-11-10 ENCOUNTER — Ambulatory Visit (INDEPENDENT_AMBULATORY_CARE_PROVIDER_SITE_OTHER): Payer: PPO | Admitting: Cardiology

## 2015-11-10 VITALS — BP 124/76 | HR 76 | Ht 70.0 in | Wt 227.0 lb

## 2015-11-10 DIAGNOSIS — E785 Hyperlipidemia, unspecified: Secondary | ICD-10-CM | POA: Diagnosis not present

## 2015-11-10 DIAGNOSIS — I10 Essential (primary) hypertension: Secondary | ICD-10-CM

## 2015-11-10 MED ORDER — FENOFIBRATE 160 MG PO TABS: 160.0000 mg | ORAL_TABLET | Freq: Every day | ORAL | Status: DC

## 2015-11-10 MED ORDER — BENAZEPRIL HCL 20 MG PO TABS: 20.0000 mg | ORAL_TABLET | Freq: Every day | ORAL | Status: DC

## 2015-11-10 NOTE — Patient Instructions (Signed)
Continue your current therapy  I will see you in one year   

## 2015-11-10 NOTE — Progress Notes (Signed)
[name] Date of Birth: 1945/11/14 Medical Record [id number]  History of Present Illness: [name] is seen for yearly follow up of HTN and hyperlipidemia. He has multiple drug intolerances. He is doing well on benazepril now reduced to 20 mg daily. Blood pressure has been controlled. He does experience occasional sharp parasternal chest pain.  He had a normal ETT in March 2015. He did undergo L4-5 back surgery in April. He reports this has helped a lot. He is starting to do more. In August 2016 he had a lipoma removed from his chest. He is now followed at the [facility] for primary care.  Current Outpatient Prescriptions on File Prior to Visit  Medication Sig Dispense Refill  . aspirin 81 MG tablet Take 81 mg by mouth daily.      . butalbital-acetaminophen-caffeine (ESGIC PLUS) 50-500-40 MG per tablet Take 1-2 tablets by mouth every 4 (four) hours as needed.      . ezetimibe (ZETIA) 10 MG tablet Take 1 tablet (10 mg total) by mouth daily. 90 tablet 3  . folic acid (FOLVITE) A999333 MCG tablet Take 800 mcg by mouth daily.      . multivitamin (THERAGRAN) per tablet Take 1 tablet by mouth daily.      . naproxen (NAPROSYN) 500 MG tablet Take 1 tablet (500 mg total) by mouth 2 (two) times daily with a meal. 180 tablet 3  . oxyCODONE (OXY IR/ROXICODONE) 5 MG immediate release tablet Take 1-2 tablets (5-10 mg total) by mouth every 4 (four) hours as needed for moderate pain, severe pain or breakthrough pain. 30 tablet 0   No current facility-administered medications on file prior to visit.    Allergies  Allergen Reactions  . Toprol Xl [Metoprolol Succinate] Other (See Comments)    Pt states is " like a govenor on a bus"   . Amoxicillin     REACTION: hives  . Atorvastatin     REACTION: Weakness in muscles  . Codeine     Pt does not have allergy to codeine but father had severe reaction to codeine so he never wants to be given to him  . Statins     Weakness in muscles    Past Medical  History  Diagnosis Date  . Hypertension   . Asthma   . Hyperlipidemia   . Osteoarthritis   . Depression   . Sleep apnea   . Colon polyps   . Sigmoid diverticulosis   . Epigastric pain   . Nausea     Occasional  . Knee pain     Past Surgical History  Procedure Laterality Date  . Colonoscopy   Nov 06, 2004  . Hernia repair    . Appendectomy    . Polypectomy    . Tonsillectomy    . Back surgery    . Shoulder surgery Right   . Mass excision Right 01/26/2015    Procedure: REMOVAL OF RIGHT CHEST WALL MASS;  Surgeon: Jackolyn Confer, MD;  Location: [facility];  Service: General;  Laterality: Right;    History  Smoking status  . Former Smoker  . Types: Cigarettes  . Quit date: 04/29/1983  Smokeless tobacco  . Never Used    History  Alcohol Use  . 1.2 oz/week  . 1 Glasses of wine, 1 Cans of beer per week    Comment: occ alcohol    Family History  Problem Relation Age of Onset  . Cancer Maternal Grandfather  Colon Cancer  . Colon cancer Maternal Grandfather   . Heart disease Father     Review of Systems: As noted in HPI.  All other systems were reviewed and are negative.  Physical Exam: BP 124/76 mmHg  Pulse 76  Ht 5\' 10"  (1.778 m)  Wt 102.967 kg (227 lb)  BMI 32.57 kg/m2 He is a WD WM in NAD. The HEENT exam is unremarkable.  The carotids are 2+ without bruits.  There is no thyromegaly.  There is no JVD.  The lungs are clear.  The heart exam reveals a regular rate with a normal S1 and S2.  There are no murmurs, gallops, or rubs.  The PMI is not displaced.   Abdominal exam reveals good bowel sounds.  There is no hepatosplenomegaly or tenderness.  There are no masses.  Exam of the legs reveal no clubbing, cyanosis, or edema.  The distal pulses are intact.  Cranial nerves II - XII are intact.   LABORATORY DATA: Ecg shows normal sinus rhythm with a normal ECG. Freq PACs. Otherwise normal.  I have personally reviewed and interpreted this  study.  Labs reviewed from the [facility] in 09/26/15: CBC, CMET, TSH all normal. A1c 5.6%. Cholesterol 226, trig 113, HDL 76, LDL 122.   Assessment / Plan: 1. Hypertension. Well-controlled on benazepril. Continue same therapy. Prescription refilled.  2. Hyperlipidemia.  Intolerant to statins in the past. Lipids have improved with weight loss. Continue Zetia and fenofibrate 160 mg daily. continue fish oil.  I will follow up in one year.

## 2015-11-16 DIAGNOSIS — H35371 Puckering of macula, right eye: Secondary | ICD-10-CM | POA: Diagnosis not present

## 2015-11-16 DIAGNOSIS — H26492 Other secondary cataract, left eye: Secondary | ICD-10-CM | POA: Diagnosis not present

## 2015-11-16 DIAGNOSIS — H2511 Age-related nuclear cataract, right eye: Secondary | ICD-10-CM | POA: Diagnosis not present

## 2015-11-16 DIAGNOSIS — Z9889 Other specified postprocedural states: Secondary | ICD-10-CM | POA: Diagnosis not present

## 2015-11-16 DIAGNOSIS — H35362 Drusen (degenerative) of macula, left eye: Secondary | ICD-10-CM | POA: Diagnosis not present

## 2015-11-16 DIAGNOSIS — Z961 Presence of intraocular lens: Secondary | ICD-10-CM | POA: Diagnosis not present

## 2016-01-02 DIAGNOSIS — H2511 Age-related nuclear cataract, right eye: Secondary | ICD-10-CM | POA: Insufficient documentation

## 2016-01-02 DIAGNOSIS — Z961 Presence of intraocular lens: Secondary | ICD-10-CM | POA: Insufficient documentation

## 2016-01-02 DIAGNOSIS — H35371 Puckering of macula, right eye: Secondary | ICD-10-CM | POA: Diagnosis not present

## 2016-01-02 DIAGNOSIS — H53002 Unspecified amblyopia, left eye: Secondary | ICD-10-CM | POA: Diagnosis not present

## 2016-02-05 DIAGNOSIS — Z9889 Other specified postprocedural states: Secondary | ICD-10-CM | POA: Insufficient documentation

## 2016-02-05 DIAGNOSIS — Z961 Presence of intraocular lens: Secondary | ICD-10-CM | POA: Diagnosis not present

## 2016-02-06 DIAGNOSIS — M25561 Pain in right knee: Secondary | ICD-10-CM | POA: Diagnosis not present

## 2016-02-06 DIAGNOSIS — M25562 Pain in left knee: Secondary | ICD-10-CM | POA: Diagnosis not present

## 2016-03-06 ENCOUNTER — Telehealth: Payer: Self-pay

## 2016-03-06 NOTE — Telephone Encounter (Signed)
Received surgical clearance from [facility].Dr.Jordan cleared patient for surgery.Form faxed back to fax # [phone].

## 2016-03-25 DIAGNOSIS — M1711 Unilateral primary osteoarthritis, right knee: Secondary | ICD-10-CM | POA: Diagnosis not present

## 2016-03-28 ENCOUNTER — Encounter (HOSPITAL_COMMUNITY): Payer: Self-pay

## 2016-03-28 NOTE — Pre-Procedure Instructions (Signed)
GIRO POTTS  03/28/2016      [facility] ([facility] [place] - [id number] [place] ([place]) [place] [address] Phone: [phone] Fax: [phone]  [place], [place] - [address] [place] [address] Phone: [phone] Fax: [phone]    Your procedure is scheduled on Wednesday, November 1st, 2017.  Report to [facility] Admitting at 9:15 A.M.   Call this number if you have problems the morning of surgery:  [phone]   Remember:  Do not eat food or drink liquids after midnight.   Take these medicines the morning of surgery with A SIP OF WATER: Cyclobenzaprine (Flexeril) if needed, Acetaminophen-caffeine-Butalbital if needed.   7 days prior to surgery, stop taking: Aspirin, NSAIDS, Aleve, Naproxen, Ibuprofen, Advil, Motrin, BC's, Goody's, Fish oil, all herbal medications, and all vitamins.    Do not wear jewelry.  Do not wear lotions, powders, or colognes, or deoderant.  Men may shave face and neck.  Do not bring valuables to the hospital.  [facility] is not responsible for any belongings or valuables.  Contacts, dentures or bridgework may not be worn into surgery.  Leave your suitcase in the car.  After surgery it may be brought to your room.  For patients admitted to the hospital, discharge time will be determined by your treatment team.  Patients discharged the day of surgery will not be allowed to drive home.   Special instructions:  Preparing for Surgery.   Please read over the following fact sheets that you were given. MRSA Information    [facility]- Preparing For Surgery  Before surgery, you can play an important role. Because skin is not sterile, your skin needs to be as free of germs as possible. You can reduce the number of germs on your skin by washing with CHG (chlorahexidine gluconate) Soap before surgery.  CHG is an  antiseptic cleaner which kills germs and bonds with the skin to continue killing germs even after washing.  Please do not use if you have an allergy to CHG or antibacterial soaps. If your skin becomes reddened/irritated stop using the CHG.  Do not shave (including legs and underarms) for at least 48 hours prior to first CHG shower. It is OK to shave your face.  Please follow these instructions carefully.   1. Shower the NIGHT BEFORE SURGERY and the MORNING OF SURGERY with CHG.   2. If you chose to wash your hair, wash your hair first as usual with your normal shampoo.  3. After you shampoo, rinse your hair and body thoroughly to remove the shampoo.  4. Use CHG as you would any other liquid soap. You can apply CHG directly to the skin and wash gently with a scrungie or a clean washcloth.   5. Apply the CHG Soap to your body ONLY FROM THE NECK DOWN.  Do not use on open wounds or open sores. Avoid contact with your eyes, ears, mouth and genitals (private parts). Wash genitals (private parts) with your normal soap.  6. Wash thoroughly, paying special attention to the area where your surgery will be performed.  7. Thoroughly rinse your body with warm water from the neck down.  8. DO NOT shower/wash with your normal soap after using and rinsing off the CHG Soap.  9. Pat yourself dry with a CLEAN TOWEL.   10. Wear CLEAN PAJAMAS   11.  Place CLEAN SHEETS on your bed the night of your first shower and DO NOT SLEEP WITH PETS.  Day of Surgery: Do not apply any deodorants/lotions. Please wear clean clothes to the hospital/surgery center.

## 2016-03-29 ENCOUNTER — Encounter (HOSPITAL_COMMUNITY)
Admission: RE | Admit: 2016-03-29 | Discharge: 2016-03-29 | Disposition: A | Discharge status: Home / Self Care | Payer: PPO | Source: Ambulatory Visit | Attending: Orthopedic Surgery | Admitting: Orthopedic Surgery

## 2016-03-29 ENCOUNTER — Encounter (HOSPITAL_COMMUNITY): Payer: Self-pay

## 2016-03-29 ENCOUNTER — Ambulatory Visit (HOSPITAL_COMMUNITY)
Admission: RE | Admit: 2016-03-29 | Discharge: 2016-03-29 | Disposition: A | Discharge status: Home / Self Care | Payer: PPO | Source: Ambulatory Visit | Attending: Orthopedic Surgery | Admitting: Orthopedic Surgery

## 2016-03-29 DIAGNOSIS — G4733 Obstructive sleep apnea (adult) (pediatric): Secondary | ICD-10-CM | POA: Insufficient documentation

## 2016-03-29 DIAGNOSIS — Z01812 Encounter for preprocedural laboratory examination: Secondary | ICD-10-CM | POA: Diagnosis not present

## 2016-03-29 DIAGNOSIS — I700 Atherosclerosis of aorta: Secondary | ICD-10-CM | POA: Insufficient documentation

## 2016-03-29 DIAGNOSIS — Z01818 Encounter for other preprocedural examination: Secondary | ICD-10-CM | POA: Diagnosis not present

## 2016-03-29 DIAGNOSIS — E785 Hyperlipidemia, unspecified: Secondary | ICD-10-CM | POA: Diagnosis not present

## 2016-03-29 DIAGNOSIS — I10 Essential (primary) hypertension: Secondary | ICD-10-CM | POA: Diagnosis not present

## 2016-03-29 DIAGNOSIS — Z87891 Personal history of nicotine dependence: Secondary | ICD-10-CM | POA: Insufficient documentation

## 2016-03-29 HISTORY — DX: Dyspnea, unspecified: R06.00

## 2016-03-29 HISTORY — DX: Presence of spectacles and contact lenses: Z97.3

## 2016-03-29 HISTORY — DX: Headache, unspecified: R51.9

## 2016-03-29 HISTORY — DX: Personal history of other diseases of the respiratory system: Z87.09

## 2016-03-29 HISTORY — DX: Personal history of pneumonia (recurrent): Z87.01

## 2016-03-29 HISTORY — DX: Headache: R51

## 2016-03-29 HISTORY — DX: Unspecified hearing loss, unspecified ear: H91.90

## 2016-03-29 LAB — COMPREHENSIVE METABOLIC PANEL
ALT: 32 U/L (ref 17–63)
AST: 29 U/L (ref 15–41)
Albumin: 4.2 g/dL (ref 3.5–5.0)
Alkaline Phosphatase: 50 U/L (ref 38–126)
Anion gap: 7 (ref 5–15)
BUN: 14 mg/dL (ref 6–20)
CHLORIDE: 107 mmol/L (ref 101–111)
CO2: 24 mmol/L (ref 22–32)
Calcium: 9.8 mg/dL (ref 8.9–10.3)
Creatinine, Ser: 1.03 mg/dL (ref 0.61–1.24)
Glucose, Bld: 95 mg/dL (ref 65–99)
POTASSIUM: 4.2 mmol/L (ref 3.5–5.1)
SODIUM: 138 mmol/L (ref 135–145)
Total Bilirubin: 0.5 mg/dL (ref 0.3–1.2)
Total Protein: 6.9 g/dL (ref 6.5–8.1)

## 2016-03-29 LAB — CBC WITH DIFFERENTIAL/PLATELET
BASOS ABS: 0 10*3/uL (ref 0.0–0.1)
Basophils Relative: 0 %
EOS ABS: 0.2 10*3/uL (ref 0.0–0.7)
EOS PCT: 2 %
HCT: 46.1 % (ref 39.0–52.0)
Hemoglobin: 15.9 g/dL (ref 13.0–17.0)
LYMPHS PCT: 26 %
Lymphs Abs: 2.2 10*3/uL (ref 0.7–4.0)
MCH: 30.9 pg (ref 26.0–34.0)
MCHC: 34.5 g/dL (ref 30.0–36.0)
MCV: 89.5 fL (ref 78.0–100.0)
MONO ABS: 0.9 10*3/uL (ref 0.1–1.0)
Monocytes Relative: 10 %
Neutro Abs: 5.3 10*3/uL (ref 1.7–7.7)
Neutrophils Relative %: 62 %
PLATELETS: 299 10*3/uL (ref 150–400)
RBC: 5.15 MIL/uL (ref 4.22–5.81)
RDW: 13.2 % (ref 11.5–15.5)
WBC: 8.7 10*3/uL (ref 4.0–10.5)

## 2016-03-29 LAB — URINALYSIS, ROUTINE W REFLEX MICROSCOPIC
BILIRUBIN URINE: NEGATIVE
GLUCOSE, UA: NEGATIVE mg/dL
HGB URINE DIPSTICK: NEGATIVE
KETONES UR: NEGATIVE mg/dL
Leukocytes, UA: NEGATIVE
Nitrite: NEGATIVE
PH: 6.5 (ref 5.0–8.0)
Protein, ur: NEGATIVE mg/dL
Specific Gravity, Urine: 1.015 (ref 1.005–1.030)

## 2016-03-29 LAB — TYPE AND SCREEN
ABO/RH(D): O POS
ANTIBODY SCREEN: NEGATIVE

## 2016-03-29 LAB — PROTIME-INR
INR: 1.04
PROTHROMBIN TIME: 13.6 s (ref 11.4–15.2)

## 2016-03-29 LAB — APTT: APTT: 32 s (ref 24–36)

## 2016-03-29 LAB — ABO/RH: ABO/RH(D): O POS

## 2016-03-29 LAB — SURGICAL PCR SCREEN
MRSA, PCR: NEGATIVE
STAPHYLOCOCCUS AUREUS: NEGATIVE

## 2016-03-29 NOTE — Progress Notes (Signed)
PCP - [facility] Cardiologist - Dr. Peter Martinique  EKG - 11/10/15 CXR - 03/29/16 Echo-denies Stress test - 2015 Cardiac Cath - denies  Patient denies chest pain and shortness of breath at PAT appointment.

## 2016-03-30 LAB — URINE CULTURE

## 2016-04-01 NOTE — Progress Notes (Signed)
Anesthesia Chart Review:  Pt is a 70 year old male scheduled for R total knee arthroplasty on 04/10/2016 with Kathryne Hitch, MD.   [facility] primary care at the [facility] is Peter Martinique, MD who follows pt's HTN and hyperlipidemia; last office visit 11/10/15, f/u in 1 year recommended.   PMH includes:  HTN, hyperlipidemia, OSA.  Hard of hearing. Former smoker. BMI 34.5. S/p chest wall mass removal 01/26/15.   Medications include ASA 81 mg, benazepril, fenofibrate, zetia.   Preoperative labs reviewed.    EKG 11/10/15: Sinus rhythm with first-degree AV block with PACs.  If no changes, I anticipate pt can proceed with surgery as scheduled.   Willeen Cass, FNP-BC [facility] Short Stay Surgical Center/Anesthesiology Phone: [phone] 04/01/2016 4:23 PM

## 2016-04-08 DIAGNOSIS — N401 Enlarged prostate with lower urinary tract symptoms: Secondary | ICD-10-CM | POA: Diagnosis not present

## 2016-04-08 DIAGNOSIS — N138 Other obstructive and reflux uropathy: Secondary | ICD-10-CM | POA: Diagnosis not present

## 2016-04-08 DIAGNOSIS — N528 Other male erectile dysfunction: Secondary | ICD-10-CM | POA: Diagnosis not present

## 2016-04-08 NOTE — H&P (Signed)
Chief complaint:  Painful right knee.  HISTORY: [name] is a 70 year old white male who is seen today for follow and evaluation of his right knee.  He has a history of pain and functional disability in his right knee due to arthritis and has actually failed nonsurgical conservative treatment for greater than 12 weeks which included anti-inflammatories as well as corticosteroid injection, weight reduction, and activity modification.  It has been of gradual onset.  He dates back an injury in 1975 when he stepped on a grate that was missing one of the steps and he went down in between the grate and came up to where his knee was.  Over the last several years, he has had worsening pain and discomfort.  At times he states his pain is only 1-2/10, but with activity it can go as high as 10/10.  He does have some nighttime as well as pain with activity and weightbearing pain which does not interfere with his activities of daily living.  He does complain of some joint swelling and crepitance.  He had been noted to have on his x-ray subchondral sclerosis as well as periarticular osteophytes and mild joint subluxation with joint space narrowing.  He is seen today for evaluation.   Past medical history:  In general his health is good.  Surgeries:  Herniorrhaphy, tonsillectomy, appendectomy, collar bone surgery, and L5 and L4 fusions lumbar spine.  Medications:  Fenofibrate acid 135 mg in the morning, naproxen 500 mg q. 12 hours, Zetia 10 mg daily, benazepril 20 mg daily, Centrum, folic acid, and aspirin 81 mg daily.  Allergies:  Codeine, Toprol, and amoxicillin.  Review of systems:  14-point review of systems positive for glasses as well as decreased hearing.  He has had hypertension and is controlled with benazepril.  He has had a history of asthma in the past.  He has had hypertension for 15 years.  He does have nocturia of 1-2 times per evening.  Occasional headaches and migraines are also noted.  Thyroid problems  in the past have also been noted.  Family history:  Mother died at age 29 from old age.  Father died at age 14 from the use of Toprol according to the patient and was being treated for arrhythmia.  He has no brothers.  Sisters age 52 and 19, both in good health.  Social history:  Mr. Franchina is a 70 year old white, married male.  He is retired.  He quit smoking in cigarettes in 1980; prior to that he was smoking three packs of cigarettes per day for 20 years.  He drinks alcohol occasionally.  EXAMINATION: Well developed, well nourished, alert, cooperative, in moderate distress.  Height is 5 feet, 10 inches.  Weight is 238 pounds.  Vital signs:  Temperature 98.4 degrees, pulse 72, respirations 12, blood pressure 145/78.  Head is normocephalic.  Eyes: Pupils equal, round, and reactive to light and accommodation with EOMs intact.  Ears, nose, and throat benign.  Chest has good expansion.  Lungs essentially clear.  Cardiac:  Regular rhythm and rate, normal S1/S2.  No discrete murmurs noted.  Pulses 1+ bilateral and symmetric in the lower extremities.  Abdomen is scaphoid, soft, nontender, no masses palpable, normal bowel sounds present.  Genital, rectal, and breast exam not indicated for an orthopedic procedure.  CNS:  Oriented x 3 and cranial nerves 2-12 grossly intact.    Musculoskeletal:  He has range of motion from about 3 degrees to 110 degrees.  He does have crepitance with  range of motion.  Mildly valgus positioning.  He is correctable to neutral.  Stable with varus and valgus stressing.  Calves are supple and nontender.    X-RAYS: Radiographic studies reveal moderate degenerative changes in the right knee with the overall alignment valgus.  Bone quality appears good for his age.  CLINICAL IMPRESSION: 1.  End-stage OA of the right knee. 2.  History of hypertension. 3.  Migraines.  RECOMMENDATIONS: At this time, we feel that the patient's history, physical exam and clinical judgement of this  provider, and imaging studies are consistent with end-stage OA of the right knee.  Treatment options including medical management, injection therapy, and arthroscopy were discussed.  However, the patient will not benefit.  The risks and benefits of total knee arthroplasty were presented and reviewed.  The risks due to aseptic loosening, infection, stiffness, patellar tracking problems, blood clots, and other complications were discussed in detail.    Mike Craze Haron Beilke, PA-C 04/08/2016 1:57 PM

## 2016-04-09 MED ORDER — VANCOMYCIN HCL 10 G IV SOLR
1500.0000 mg | INTRAVENOUS | Status: AC
  Administered 2016-04-10 (×2): 1500 mg via INTRAVENOUS
  Filled 2016-04-09: qty 1500

## 2016-04-09 MED ORDER — TRANEXAMIC ACID 1000 MG/10ML IV SOLN
1000.0000 mg | INTRAVENOUS | Status: AC
  Administered 2016-04-10: 1000 mg via INTRAVENOUS
  Filled 2016-04-09: qty 10

## 2016-04-09 MED ORDER — SODIUM CHLORIDE 0.9 % IV SOLN: INTRAVENOUS | Status: DC

## 2016-04-10 ENCOUNTER — Encounter (HOSPITAL_COMMUNITY): Payer: Self-pay | Admitting: Anesthesiology

## 2016-04-10 ENCOUNTER — Encounter (HOSPITAL_COMMUNITY)
Admission: RE | Disposition: A | Discharge status: Home Health | Payer: Self-pay | Source: Ambulatory Visit | Attending: Orthopedic Surgery

## 2016-04-10 ENCOUNTER — Inpatient Hospital Stay (HOSPITAL_COMMUNITY): Payer: PPO | Admitting: Emergency Medicine

## 2016-04-10 ENCOUNTER — Inpatient Hospital Stay (HOSPITAL_COMMUNITY): Payer: PPO

## 2016-04-10 ENCOUNTER — Inpatient Hospital Stay (HOSPITAL_COMMUNITY)
Admission: RE | Admit: 2016-04-10 | Discharge: 2016-04-11 | DRG: 470 | Disposition: A | Discharge status: Home Health | Payer: PPO | Source: Ambulatory Visit | Attending: Orthopedic Surgery | Admitting: Orthopedic Surgery

## 2016-04-10 ENCOUNTER — Inpatient Hospital Stay (HOSPITAL_COMMUNITY): Payer: PPO | Admitting: Anesthesiology

## 2016-04-10 DIAGNOSIS — G43909 Migraine, unspecified, not intractable, without status migrainosus: Secondary | ICD-10-CM | POA: Diagnosis present

## 2016-04-10 DIAGNOSIS — J45909 Unspecified asthma, uncomplicated: Secondary | ICD-10-CM | POA: Diagnosis not present

## 2016-04-10 DIAGNOSIS — Z86010 Personal history of colonic polyps: Secondary | ICD-10-CM | POA: Diagnosis not present

## 2016-04-10 DIAGNOSIS — G4730 Sleep apnea, unspecified: Secondary | ICD-10-CM | POA: Diagnosis not present

## 2016-04-10 DIAGNOSIS — I10 Essential (primary) hypertension: Secondary | ICD-10-CM | POA: Diagnosis present

## 2016-04-10 DIAGNOSIS — Z87891 Personal history of nicotine dependence: Secondary | ICD-10-CM

## 2016-04-10 DIAGNOSIS — Z96659 Presence of unspecified artificial knee joint: Secondary | ICD-10-CM

## 2016-04-10 DIAGNOSIS — Z7982 Long term (current) use of aspirin: Secondary | ICD-10-CM

## 2016-04-10 DIAGNOSIS — Z79899 Other long term (current) drug therapy: Secondary | ICD-10-CM | POA: Diagnosis not present

## 2016-04-10 DIAGNOSIS — Z96652 Presence of left artificial knee joint: Secondary | ICD-10-CM | POA: Diagnosis not present

## 2016-04-10 DIAGNOSIS — M25562 Pain in left knee: Secondary | ICD-10-CM | POA: Diagnosis not present

## 2016-04-10 DIAGNOSIS — M1712 Unilateral primary osteoarthritis, left knee: Principal | ICD-10-CM | POA: Diagnosis present

## 2016-04-10 DIAGNOSIS — R262 Difficulty in walking, not elsewhere classified: Secondary | ICD-10-CM

## 2016-04-10 DIAGNOSIS — Z471 Aftercare following joint replacement surgery: Secondary | ICD-10-CM | POA: Diagnosis not present

## 2016-04-10 HISTORY — PX: TOTAL KNEE ARTHROPLASTY: SHX125

## 2016-04-10 SURGERY — ARTHROPLASTY, KNEE, TOTAL
Anesthesia: Monitor Anesthesia Care | Site: Knee | Laterality: Left

## 2016-04-10 MED ORDER — PHENYLEPHRINE 40 MCG/ML (10ML) SYRINGE FOR IV PUSH (FOR BLOOD PRESSURE SUPPORT)
PREFILLED_SYRINGE | INTRAVENOUS | Status: AC
  Filled 2016-04-10: qty 10

## 2016-04-10 MED ORDER — BUPIVACAINE IN DEXTROSE 0.75-8.25 % IT SOLN
INTRATHECAL | Status: DC | PRN
  Administered 2016-04-10: 2 mL via INTRATHECAL

## 2016-04-10 MED ORDER — APIXABAN 2.5 MG PO TABS: ORAL_TABLET | ORAL | 0 refills | Status: DC

## 2016-04-10 MED ORDER — DEXAMETHASONE SODIUM PHOSPHATE 10 MG/ML IJ SOLN
10.0000 mg | Freq: Once | INTRAMUSCULAR | Status: AC
  Administered 2016-04-11: 10 mg via INTRAVENOUS
  Filled 2016-04-10: qty 1

## 2016-04-10 MED ORDER — PROPOFOL 10 MG/ML IV BOLUS
INTRAVENOUS | Status: AC
  Filled 2016-04-10: qty 20

## 2016-04-10 MED ORDER — HYDROMORPHONE HCL 2 MG PO TABS: ORAL_TABLET | ORAL | 0 refills | Status: DC

## 2016-04-10 MED ORDER — BENAZEPRIL HCL 20 MG PO TABS
20.0000 mg | ORAL_TABLET | Freq: Every day | ORAL | Status: DC
  Filled 2016-04-10: qty 1

## 2016-04-10 MED ORDER — METOCLOPRAMIDE HCL 5 MG PO TABS: 5.0000 mg | ORAL_TABLET | Freq: Three times a day (TID) | ORAL | Status: DC | PRN

## 2016-04-10 MED ORDER — ONDANSETRON HCL 4 MG PO TABS: 4.0000 mg | ORAL_TABLET | Freq: Four times a day (QID) | ORAL | Status: DC | PRN

## 2016-04-10 MED ORDER — CELECOXIB 200 MG PO CAPS
200.0000 mg | ORAL_CAPSULE | Freq: Two times a day (BID) | ORAL | Status: DC
  Administered 2016-04-10 – 2016-04-11 (×2): 200 mg via ORAL
  Filled 2016-04-10 (×2): qty 1

## 2016-04-10 MED ORDER — FENOFIBRATE 160 MG PO TABS
160.0000 mg | ORAL_TABLET | Freq: Every day | ORAL | Status: DC
  Administered 2016-04-11: 160 mg via ORAL
  Filled 2016-04-10: qty 1

## 2016-04-10 MED ORDER — CYCLOBENZAPRINE HCL 10 MG PO TABS: 5.0000 mg | ORAL_TABLET | Freq: Three times a day (TID) | ORAL | Status: DC | PRN

## 2016-04-10 MED ORDER — SODIUM CHLORIDE 0.9 % IR SOLN
Status: DC | PRN
  Administered 2016-04-10: 3000 mL

## 2016-04-10 MED ORDER — OXYCODONE HCL 5 MG/5ML PO SOLN: 5.0000 mg | Freq: Once | ORAL | Status: DC | PRN

## 2016-04-10 MED ORDER — EPHEDRINE 5 MG/ML INJ
INTRAVENOUS | Status: AC
  Filled 2016-04-10: qty 10

## 2016-04-10 MED ORDER — HYDROMORPHONE HCL 2 MG PO TABS
2.0000 mg | ORAL_TABLET | ORAL | Status: DC | PRN
  Administered 2016-04-10: 2 mg via ORAL
  Administered 2016-04-10 – 2016-04-11 (×5): 4 mg via ORAL
  Filled 2016-04-10: qty 2
  Filled 2016-04-10: qty 1
  Filled 2016-04-10 (×4): qty 2

## 2016-04-10 MED ORDER — APIXABAN 2.5 MG PO TABS
2.5000 mg | ORAL_TABLET | Freq: Two times a day (BID) | ORAL | Status: DC
Start: 2016-04-11 — End: 2016-04-11
  Administered 2016-04-11: 2.5 mg via ORAL
  Filled 2016-04-10: qty 1

## 2016-04-10 MED ORDER — MIDAZOLAM HCL 2 MG/2ML IJ SOLN
INTRAMUSCULAR | Status: AC
  Filled 2016-04-10: qty 2

## 2016-04-10 MED ORDER — BUPIVACAINE LIPOSOME 1.3 % IJ SUSP
20.0000 mL | INTRAMUSCULAR | Status: AC
  Administered 2016-04-10: 20 mL
  Filled 2016-04-10: qty 20

## 2016-04-10 MED ORDER — ACETAMINOPHEN 650 MG RE SUPP: 650.0000 mg | Freq: Four times a day (QID) | RECTAL | Status: DC | PRN

## 2016-04-10 MED ORDER — LACTATED RINGERS IV SOLN
INTRAVENOUS | Status: DC
  Administered 2016-04-10 (×3): via INTRAVENOUS

## 2016-04-10 MED ORDER — ONDANSETRON HCL 4 MG/2ML IJ SOLN: 4.0000 mg | Freq: Four times a day (QID) | INTRAMUSCULAR | Status: DC | PRN

## 2016-04-10 MED ORDER — MENTHOL 3 MG MT LOZG: 1.0000 | LOZENGE | OROMUCOSAL | Status: DC | PRN

## 2016-04-10 MED ORDER — BISACODYL 10 MG RE SUPP: 10.0000 mg | Freq: Every day | RECTAL | Status: DC | PRN

## 2016-04-10 MED ORDER — EPHEDRINE SULFATE 50 MG/ML IJ SOLN
INTRAMUSCULAR | Status: DC | PRN
  Administered 2016-04-10 (×2): 10 mg via INTRAVENOUS

## 2016-04-10 MED ORDER — HYDROMORPHONE HCL 1 MG/ML IJ SOLN: 0.2500 mg | INTRAMUSCULAR | Status: DC | PRN

## 2016-04-10 MED ORDER — FENTANYL CITRATE (PF) 100 MCG/2ML IJ SOLN
INTRAMUSCULAR | Status: DC | PRN
  Administered 2016-04-10: 100 ug via INTRAVENOUS

## 2016-04-10 MED ORDER — ALUM & MAG HYDROXIDE-SIMETH 200-200-20 MG/5ML PO SUSP: 30.0000 mL | ORAL | Status: DC | PRN

## 2016-04-10 MED ORDER — ROCURONIUM BROMIDE 10 MG/ML (PF) SYRINGE
PREFILLED_SYRINGE | INTRAVENOUS | Status: AC
  Filled 2016-04-10: qty 10

## 2016-04-10 MED ORDER — PROPOFOL 500 MG/50ML IV EMUL
INTRAVENOUS | Status: DC | PRN
  Administered 2016-04-10: 50 ug/kg/min via INTRAVENOUS

## 2016-04-10 MED ORDER — FENOFIBRIC ACID 135 MG PO CPDR: 135.0000 mg | DELAYED_RELEASE_CAPSULE | Freq: Every day | ORAL | Status: DC

## 2016-04-10 MED ORDER — ACETAMINOPHEN 325 MG PO TABS: 650.0000 mg | ORAL_TABLET | Freq: Four times a day (QID) | ORAL | Status: DC | PRN

## 2016-04-10 MED ORDER — ONDANSETRON HCL 4 MG PO TABS: 4.0000 mg | ORAL_TABLET | Freq: Three times a day (TID) | ORAL | 0 refills | Status: DC | PRN

## 2016-04-10 MED ORDER — ONDANSETRON HCL 4 MG/2ML IJ SOLN
INTRAMUSCULAR | Status: AC
  Filled 2016-04-10: qty 6

## 2016-04-10 MED ORDER — PROPOFOL 10 MG/ML IV BOLUS
INTRAVENOUS | Status: DC | PRN
Start: 2016-04-10 — End: 2016-04-10
  Administered 2016-04-10: 20 mg via INTRAVENOUS

## 2016-04-10 MED ORDER — LIDOCAINE 2% (20 MG/ML) 5 ML SYRINGE
INTRAMUSCULAR | Status: AC
  Filled 2016-04-10: qty 15

## 2016-04-10 MED ORDER — DIPHENHYDRAMINE HCL 12.5 MG/5ML PO ELIX: 12.5000 mg | ORAL_SOLUTION | ORAL | Status: DC | PRN

## 2016-04-10 MED ORDER — BUPIVACAINE HCL (PF) 0.5 % IJ SOLN
INTRAMUSCULAR | Status: AC
  Filled 2016-04-10: qty 10

## 2016-04-10 MED ORDER — DOCUSATE SODIUM 100 MG PO CAPS
100.0000 mg | ORAL_CAPSULE | Freq: Two times a day (BID) | ORAL | Status: DC
  Administered 2016-04-10 – 2016-04-11 (×2): 100 mg via ORAL
  Filled 2016-04-10 (×2): qty 1

## 2016-04-10 MED ORDER — METOCLOPRAMIDE HCL 5 MG/ML IJ SOLN
5.0000 mg | Freq: Three times a day (TID) | INTRAMUSCULAR | Status: DC | PRN
Start: 2016-04-10 — End: 2016-04-11

## 2016-04-10 MED ORDER — EZETIMIBE 10 MG PO TABS
10.0000 mg | ORAL_TABLET | Freq: Every day | ORAL | Status: DC
  Administered 2016-04-10: 10 mg via ORAL
  Filled 2016-04-10: qty 1

## 2016-04-10 MED ORDER — ONDANSETRON HCL 4 MG/2ML IJ SOLN
INTRAMUSCULAR | Status: AC
  Filled 2016-04-10: qty 4

## 2016-04-10 MED ORDER — FENTANYL CITRATE (PF) 100 MCG/2ML IJ SOLN
INTRAMUSCULAR | Status: AC
  Filled 2016-04-10: qty 2

## 2016-04-10 MED ORDER — HYDROMORPHONE HCL 2 MG/ML IJ SOLN
0.5000 mg | INTRAMUSCULAR | Status: DC | PRN
  Administered 2016-04-10: 0.5 mg via INTRAVENOUS
  Administered 2016-04-10: 1 mg via INTRAVENOUS
  Filled 2016-04-10 (×2): qty 1

## 2016-04-10 MED ORDER — CHLORHEXIDINE GLUCONATE 4 % EX LIQD: 60.0000 mL | Freq: Once | CUTANEOUS | Status: DC

## 2016-04-10 MED ORDER — POTASSIUM CHLORIDE IN NACL 20-0.9 MEQ/L-% IV SOLN
INTRAVENOUS | Status: DC
  Administered 2016-04-10: 20:00:00 via INTRAVENOUS
  Filled 2016-04-10: qty 1000

## 2016-04-10 MED ORDER — FOLIC ACID 400 MCG PO TABS: 400.0000 ug | ORAL_TABLET | Freq: Every day | ORAL | Status: DC

## 2016-04-10 MED ORDER — MIDAZOLAM HCL 5 MG/5ML IJ SOLN
INTRAMUSCULAR | Status: DC | PRN
  Administered 2016-04-10: 2 mg via INTRAVENOUS

## 2016-04-10 MED ORDER — PHENOL 1.4 % MT LIQD: 1.0000 | OROMUCOSAL | Status: DC | PRN

## 2016-04-10 MED ORDER — OXYCODONE HCL 5 MG PO TABS: 5.0000 mg | ORAL_TABLET | Freq: Once | ORAL | Status: DC | PRN

## 2016-04-10 MED ORDER — POLYETHYLENE GLYCOL 3350 17 G PO PACK
17.0000 g | PACK | Freq: Every day | ORAL | Status: DC | PRN
  Filled 2016-04-10: qty 1

## 2016-04-10 MED ORDER — LIDOCAINE HCL (CARDIAC) 20 MG/ML IV SOLN
INTRAVENOUS | Status: DC | PRN
  Administered 2016-04-10: 30 mg via INTRAVENOUS

## 2016-04-10 MED ORDER — SODIUM CHLORIDE 0.9 % IJ SOLN
INTRAMUSCULAR | Status: DC | PRN
  Administered 2016-04-10: 40 mL via INTRAVENOUS

## 2016-04-10 MED ORDER — ZOLPIDEM TARTRATE 5 MG PO TABS: 5.0000 mg | ORAL_TABLET | Freq: Every evening | ORAL | Status: DC | PRN

## 2016-04-10 MED ORDER — BUPIVACAINE HCL 0.5 % IJ SOLN
INTRAMUSCULAR | Status: DC | PRN
  Administered 2016-04-10: 10 mL

## 2016-04-10 MED ORDER — VANCOMYCIN HCL IN DEXTROSE 1-5 GM/200ML-% IV SOLN
1000.0000 mg | Freq: Two times a day (BID) | INTRAVENOUS | Status: AC
  Administered 2016-04-10: 1000 mg via INTRAVENOUS
  Filled 2016-04-10: qty 200

## 2016-04-10 MED ORDER — MAGNESIUM CITRATE PO SOLN
1.0000 | Freq: Once | ORAL | Status: DC | PRN
Start: 2016-04-10 — End: 2016-04-11

## 2016-04-10 SURGICAL SUPPLY — 66 items
APL SKNCLS STERI-STRIP NONHPOA (GAUZE/BANDAGES/DRESSINGS) ×1
BANDAGE ACE 4X5 VEL STRL LF (GAUZE/BANDAGES/DRESSINGS) ×3 IMPLANT
BANDAGE ACE 6X5 VEL STRL LF (GAUZE/BANDAGES/DRESSINGS) ×3 IMPLANT
BANDAGE ESMARK 6X9 LF (GAUZE/BANDAGES/DRESSINGS) ×1 IMPLANT
BENZOIN TINCTURE PRP APPL 2/3 (GAUZE/BANDAGES/DRESSINGS) ×3 IMPLANT
BLADE SAG 18X100X1.27 (BLADE) ×6 IMPLANT
BNDG CMPR 9X6 STRL LF SNTH (GAUZE/BANDAGES/DRESSINGS) ×1
BNDG ESMARK 6X9 LF (GAUZE/BANDAGES/DRESSINGS) ×3
BOWL SMART MIX CTS (DISPOSABLE) ×3 IMPLANT
CAPT KNEE TOTAL 3 ×2 IMPLANT
CEMENT BONE SIMPLEX SPEEDSET (Cement) ×6 IMPLANT
CLOSURE WOUND 1/2 X4 (GAUZE/BANDAGES/DRESSINGS) ×1
COVER SURGICAL LIGHT HANDLE (MISCELLANEOUS) ×3 IMPLANT
CUFF TOURNIQUET SINGLE 34IN LL (TOURNIQUET CUFF) ×3 IMPLANT
DRAPE HALF SHEET 40X57 (DRAPES) ×3 IMPLANT
DRAPE IMP U-DRAPE 54X76 (DRAPES) ×3 IMPLANT
DRAPE PROXIMA HALF (DRAPES) ×3 IMPLANT
DRAPE U-SHAPE 47X51 STRL (DRAPES) ×3 IMPLANT
DRESSING AQUACEL AG SP 3.5X10 (GAUZE/BANDAGES/DRESSINGS) IMPLANT
DRSG AQUACEL AG ADV 3.5X10 (GAUZE/BANDAGES/DRESSINGS) ×3 IMPLANT
DRSG AQUACEL AG SP 3.5X10 (GAUZE/BANDAGES/DRESSINGS) ×3
DURAPREP 26ML APPLICATOR (WOUND CARE) ×6 IMPLANT
ELECT CAUTERY BLADE 6.4 (BLADE) ×3 IMPLANT
ELECT REM PT RETURN 9FT ADLT (ELECTROSURGICAL) ×3
ELECTRODE REM PT RTRN 9FT ADLT (ELECTROSURGICAL) ×1 IMPLANT
EVACUATOR 1/8 PVC DRAIN (DRAIN) IMPLANT
FACESHIELD WRAPAROUND (MASK) ×6 IMPLANT
FACESHIELD WRAPAROUND OR TEAM (MASK) ×2 IMPLANT
GLOVE BIOGEL PI IND STRL 7.0 (GLOVE) ×1 IMPLANT
GLOVE BIOGEL PI INDICATOR 7.0 (GLOVE) ×2
GLOVE ORTHO TXT STRL SZ7.5 (GLOVE) ×3 IMPLANT
GLOVE SURG ORTHO 7.0 STRL STRW (GLOVE) ×3 IMPLANT
GOWN STRL REUS W/ TWL LRG LVL3 (GOWN DISPOSABLE) ×2 IMPLANT
GOWN STRL REUS W/ TWL XL LVL3 (GOWN DISPOSABLE) ×1 IMPLANT
GOWN STRL REUS W/TWL LRG LVL3 (GOWN DISPOSABLE) ×6
GOWN STRL REUS W/TWL XL LVL3 (GOWN DISPOSABLE) ×3
HANDPIECE INTERPULSE COAX TIP (DISPOSABLE) ×3
IMMOBILIZER KNEE 22 UNIV (SOFTGOODS) ×3 IMPLANT
IMMOBILIZER KNEE 24 THIGH 36 (MISCELLANEOUS) IMPLANT
IMMOBILIZER KNEE 24 UNIV (MISCELLANEOUS)
KIT BASIN OR (CUSTOM PROCEDURE TRAY) ×3 IMPLANT
KIT ROOM TURNOVER OR (KITS) ×3 IMPLANT
MANIFOLD NEPTUNE II (INSTRUMENTS) ×3 IMPLANT
NDL 18GX1X1/2 (RX/OR ONLY) (NEEDLE) ×1 IMPLANT
NEEDLE 18GX1X1/2 (RX/OR ONLY) (NEEDLE) ×3 IMPLANT
NEEDLE HYPO 25GX1X1/2 BEV (NEEDLE) ×3 IMPLANT
NS IRRIG 1000ML POUR BTL (IV SOLUTION) ×3 IMPLANT
PACK TOTAL JOINT (CUSTOM PROCEDURE TRAY) ×3 IMPLANT
PACK UNIVERSAL I (CUSTOM PROCEDURE TRAY) ×3 IMPLANT
PAD ARMBOARD 7.5X6 YLW CONV (MISCELLANEOUS) ×6 IMPLANT
SET HNDPC FAN SPRY TIP SCT (DISPOSABLE) ×1 IMPLANT
STRIP CLOSURE SKIN 1/2X4 (GAUZE/BANDAGES/DRESSINGS) ×3 IMPLANT
SUCTION FRAZIER HANDLE 10FR (MISCELLANEOUS) ×2
SUCTION TUBE FRAZIER 10FR DISP (MISCELLANEOUS) ×1 IMPLANT
SUT MNCRL AB 4-0 PS2 18 (SUTURE) ×3 IMPLANT
SUT VIC AB 0 CT1 27 (SUTURE)
SUT VIC AB 0 CT1 27XBRD ANBCTR (SUTURE) IMPLANT
SUT VIC AB 1 CTX 36 (SUTURE) ×3
SUT VIC AB 1 CTX36XBRD ANBCTR (SUTURE) ×1 IMPLANT
SUT VIC AB 2-0 CT1 27 (SUTURE) ×6
SUT VIC AB 2-0 CT1 TAPERPNT 27 (SUTURE) ×2 IMPLANT
SYR 50ML LL SCALE MARK (SYRINGE) ×3 IMPLANT
SYR CONTROL 10ML LL (SYRINGE) ×3 IMPLANT
TOWEL OR 17X24 6PK STRL BLUE (TOWEL DISPOSABLE) ×3 IMPLANT
TOWEL OR 17X26 10 PK STRL BLUE (TOWEL DISPOSABLE) ×3 IMPLANT
WATER STERILE IRR 1000ML POUR (IV SOLUTION) ×3 IMPLANT

## 2016-04-10 NOTE — Anesthesia Procedure Notes (Signed)
Procedure Name: MAC Date/Time: 04/10/2016 11:30 AM Performed by: Neldon Newport Pre-anesthesia Checklist: Patient identified, Emergency Drugs available, Suction available, Patient being monitored and Timeout performed Patient Re-evaluated:Patient Re-evaluated prior to inductionOxygen Delivery Method: Simple face mask

## 2016-04-10 NOTE — Transfer of Care (Signed)
Immediate Anesthesia Transfer of Care Note  Patient: DIERRE BRAHAM  Procedure(s) Performed: Procedure(s) with comments: LEFT TOTAL KNEE ARTHROPLASTY (Left) - LEFT TOTAL KNEE ARTHROPLASTY  Patient Location: PACU  Anesthesia Type:Spinal  Level of Consciousness: awake, alert  and oriented  Airway & Oxygen Therapy: Patient Spontanous Breathing and Patient connected to nasal cannula oxygen  Post-op Assessment: Report given to RN, Post -op Vital signs reviewed and stable and Patient moving all extremities X 4  Post vital signs: Reviewed and stable  Last Vitals:  Vitals:   04/10/16 0940 04/10/16 1322  BP: 140/80 129/73  Pulse: 72 63  Resp: 20 (!) 9  Temp: 36.9 C (P) 36.1 C    Last Pain:  Vitals:   04/10/16 1322  TempSrc:   PainSc: (P) Asleep      Patients Stated Pain Goal: 3 (0000000 [id number])  Complications: No apparent anesthesia complications

## 2016-04-10 NOTE — Discharge Summary (Addendum)
Patient ID: [name] MRN: [id number] DOB/AGE: 02/03/1946 70 y.o.  Admit date: 04/10/2016 Discharge date: 04/11/2016  Admission Diagnoses:  Active Problems:   Primary localized osteoarthritis of left knee   Discharge Diagnoses:  Same  Past Medical History:  Diagnosis Date  . Asthma    as a child  . Colon polyps   . Depression   . Dyspnea   . Epigastric pain   . Hard of hearing   . Headache    migraines  . History of bronchitis   . History of pneumonia   . Hyperlipidemia   . Hypertension   . Knee pain   . Nausea    Occasional  . Osteoarthritis   . Sigmoid diverticulosis   . Sleep apnea   . Wears glasses     Surgeries: Procedure(s): LEFT TOTAL KNEE ARTHROPLASTY on 04/10/2016   Consultants:   Discharged Condition: Improved  Hospital Course: [name] is an 70 y.o. male who was admitted 04/10/2016 for operative treatment of primary localized osteoarthritis left knee. Patient has severe unremitting pain that affects sleep, daily activities, and work/hobbies. After pre-op clearance the patient was taken to the operating room on 04/10/2016 and underwent  Procedure(s): LEFT TOTAL KNEE ARTHROPLASTY.    Patient was given perioperative antibiotics:  Anti-infectives    Start     Dose/Rate Route Frequency Ordered Stop   04/10/16 2300  vancomycin (VANCOCIN) IVPB 1000 mg/200 mL premix     1,000 mg 200 mL/hr over 60 Minutes Intravenous Every 12 hours 04/10/16 1503 04/11/16 0000   04/10/16 1000  vancomycin (VANCOCIN) 1,500 mg in sodium chloride 0.9 % 500 mL IVPB     1,500 mg 250 mL/hr over 120 Minutes Intravenous To ShortStay Surgical 04/09/16 1400 04/10/16 1200       Patient was given sequential compression devices, early ambulation, and chemoprophylaxis to prevent DVT.  Patient benefited maximally from hospital stay and there were no complications.    Recent vital signs:  Patient Vitals for the past 24 hrs:  BP Temp Temp src Pulse Resp SpO2 Height Weight   04/11/16 0453 (!) 130/6 98.6 F (37 C) Oral 78 16 99 % - -  04/10/16 2355 129/68 98.1 F (36.7 C) Oral 82 16 100 % - -  04/10/16 1924 130/73 98.4 F (36.9 C) Oral 87 16 100 % - -  04/10/16 1505 133/76 97.6 F (36.4 C) Oral 60 16 100 % - -  04/10/16 1448 (!) 148/75 97 F (36.1 C) - 61 13 100 % - -  04/10/16 1430 122/86 - - 62 12 100 % - -  04/10/16 1405 (!) 113/100 - - (!) 59 13 98 % - -  04/10/16 1350 124/84 - - (!) 58 15 - - -  04/10/16 1335 128/84 - - 61 16 99 % - -  04/10/16 1322 129/73 97 F (36.1 C) - 63 (!) 9 100 % - -  04/10/16 0940 140/80 98.5 F (36.9 C) Oral 72 20 98 % 5\' 10"  (1.778 m) 108.4 kg (239 lb)     Recent laboratory studies: No results for input(s): WBC, HGB, HCT, PLT, NA, K, CL, CO2, BUN, CREATININE, GLUCOSE, INR, CALCIUM in the last 72 hours.  Invalid input(s): PT, 2   Discharge Medications:     Medication List    STOP taking these medications   ACETAMINOPHEN-CAFF-BUTALBITAL 50-500-40 MG Caps   aspirin 81 MG tablet   naproxen 500 MG tablet Commonly known as:  NAPROSYN  TAKE these medications   apixaban 2.5 MG Tabs tablet Commonly known as:  ELIQUIS Take 1 tab po q12 hours x 14 days following surgery to prevent blood clots   B-2 PO Take 1 tablet by mouth daily.   B-6 PO Take 1 tablet by mouth daily.   benazepril 20 MG tablet Commonly known as:  LOTENSIN Take 1 tablet (20 mg total) by mouth daily.   CINNAMON PO Take 1 capsule by mouth daily.   cyclobenzaprine 5 MG tablet Commonly known as:  FLEXERIL Take 5 mg by mouth every 8 (eight) hours as needed for muscle spasms.   ezetimibe 10 MG tablet Commonly known as:  ZETIA Take 1 tablet (10 mg total) by mouth daily. What changed:  when to take this   fenofibrate 160 MG tablet Take 1 tablet (160 mg total) by mouth daily.   Fenofibric Acid 135 MG Cpdr Take 135 mg by mouth daily.   folic acid A999333 MCG tablet Commonly known as:  FOLVITE Take 400 mcg by mouth daily.   GARLIC  PO Take 1 tablet by mouth 2 (two) times daily.   HYDROmorphone 2 MG tablet Commonly known as:  DILAUDID Take 1-2 tabs po q 4-6 hours prn pain   multivitamin per tablet Take 1 tablet by mouth daily.   ondansetron 4 MG tablet Commonly known as:  ZOFRAN Take 1 tablet (4 mg total) by mouth every 8 (eight) hours as needed for nausea or vomiting.       Diagnostic Studies: Dg Chest 2 View  Result Date: 03/29/2016 CLINICAL DATA:  Preop for knee surgery.  History of chest surgery. EXAM: CHEST  2 VIEW COMPARISON:  None. FINDINGS: The heart size and mediastinal contours are within normal limits. Both lungs are clear. The visualized skeletal structures are unremarkable. Atherosclerotic calcifications at the aortic arch. IMPRESSION: No active cardiopulmonary disease. Aortic atherosclerosis. Electronically Signed   By: Markus Daft M.D.   On: 03/29/2016 11:31   Dg Knee Left Port  Result Date: 04/10/2016 CLINICAL DATA:  Status post total left knee arthroplasty EXAM: PORTABLE LEFT KNEE - 1-2 VIEW COMPARISON:  None. FINDINGS: The patient is status post left knee replacement. There is anatomic alignment of the hardware. No fracture is seen. There is soft tissue swelling. Moderate soft tissue gas is present, felt secondary to recent surgery. IMPRESSION: 1. Left knee replacement without acute abnormality 2. Soft tissue swelling and soft tissue gas felt secondary to recent surgery Electronically Signed   By: Donavan Foil M.D.   On: 04/10/2016 14:30    Disposition: 01-Home or Self Care    Follow-up Information    Ninetta Lights, MD. Schedule an appointment as soon as possible for a visit in 2 week(s).   Specialty:  Orthopedic Surgery Contact information: [address] [place] [address] [phone]            Signed: Fannie Knee 04/11/2016, 7:14 AM

## 2016-04-10 NOTE — Anesthesia Procedure Notes (Signed)
Spinal  Patient location during procedure: OR Start time: 04/10/2016 11:37 AM End time: 04/10/2016 11:42 AM Staffing Anesthesiologist: Marcie Bal, ADAM Performed: anesthesiologist  Preanesthetic Checklist Completed: patient identified, site marked, surgical consent, pre-op evaluation, timeout performed, IV checked, risks and benefits discussed and monitors and equipment checked Spinal Block Patient position: sitting Prep: Betadine and site prepped and draped Patient monitoring: heart rate, cardiac monitor, continuous pulse ox and blood pressure Approach: midline Location: L2-3 Injection technique: single-shot Needle Needle type: Pencan  Needle gauge: 24 G Needle length: 9 cm Assessment Sensory level: T8 Additional Notes Pt tolerated the procedure well.

## 2016-04-10 NOTE — Anesthesia Preprocedure Evaluation (Addendum)
Anesthesia Evaluation  Patient identified by MRN, date of birth, ID band Patient awake    Reviewed: Allergy & Precautions, H&P , NPO status , Patient's Chart, lab work & pertinent test results  Airway Mallampati: II   Neck ROM: full    Dental  (+) Teeth Intact, Dental Advidsory Given   Pulmonary shortness of breath, sleep apnea , former smoker,    breath sounds clear to auscultation       Cardiovascular hypertension,  Rhythm:regular Rate:Normal     Neuro/Psych  Headaches, PSYCHIATRIC DISORDERS Depression    GI/Hepatic GERD  ,  Endo/Other  obese  Renal/GU      Musculoskeletal  (+) Arthritis ,   Abdominal   Peds  Hematology   Anesthesia Other Findings   Reproductive/Obstetrics                            Anesthesia Physical Anesthesia Plan  ASA: II  Anesthesia Plan: MAC and Spinal   Post-op Pain Management:    Induction: Intravenous  Airway Management Planned: Simple Face Mask  Additional Equipment:   Intra-op Plan:   Post-operative Plan:   Informed Consent: I have reviewed the patients History and Physical, chart, labs and discussed the procedure including the risks, benefits and alternatives for the proposed anesthesia with the patient or authorized representative who has indicated his/her understanding and acceptance.   Dental Advisory Given  Plan Discussed with: CRNA, Anesthesiologist and Surgeon  Anesthesia Plan Comments:        Anesthesia Quick Evaluation

## 2016-04-10 NOTE — Progress Notes (Signed)
Orthopedic Tech Progress Note Patient Details:  [name] 1946-02-25 [id number]  CPM Left Knee CPM Left Knee: On Left Knee Flexion (Degrees): 90 Left Knee Extension (Degrees): 0 Additional Comments: ttrapeze bar patient helper   Hildred Priest 04/10/2016, 2:36 PM Viewed order from doctor's order list

## 2016-04-10 NOTE — Discharge Instructions (Signed)
Information on my medicine - ELIQUIS (apixaban)  This medication education was reviewed with me or my healthcare representative as part of my discharge preparation.  The pharmacist that spoke with me during my hospital stay was:  Judieth Keens, PharmD.  Why was Eliquis prescribed for you? Eliquis was prescribed for you to reduce the risk of blood clots forming after orthopedic surgery.    What do You need to know about Eliquis? Take your Eliquis TWICE DAILY - one tablet in the morning and one tablet in the evening with or without food.  It would be best to take the dose about the same time each day.  If you have difficulty swallowing the tablet whole please discuss with your pharmacist how to take the medication safely.  Take Eliquis exactly as prescribed by your doctor and DO NOT stop taking Eliquis without talking to the doctor who prescribed the medication.  Stopping without other medication to take the place of Eliquis may increase your risk of developing a clot.  After discharge, you should have regular check-up appointments with your healthcare provider that is prescribing your Eliquis.  What do you do if you miss a dose? If a dose of ELIQUIS is not taken at the scheduled time, take it as soon as possible on the same day and twice-daily administration should be resumed.  The dose should not be doubled to make up for a missed dose.  Do not take more than one tablet of ELIQUIS at the same time.  Important Safety Information A possible side effect of Eliquis is bleeding. You should call your healthcare provider right away if you experience any of the following: ? Bleeding from an injury or your nose that does not stop. ? Unusual colored urine (red or dark brown) or unusual colored stools (red or black). ? Unusual bruising for unknown reasons. ? A serious fall or if you hit your head (even if there is no bleeding).  Some medicines may interact with Eliquis and might  increase your risk of bleeding or clotting while on Eliquis. To help avoid this, consult your healthcare provider or pharmacist prior to using any new prescription or non-prescription medications, including herbals, vitamins, non-steroidal anti-inflammatory drugs (NSAIDs) and supplements.  This website has more information on Eliquis (apixaban): http://www.eliquis.com/eliquis/homeINSTRUCTIONS AFTER JOINT REPLACEMENT   o Remove items at home which could result in a fall. This includes throw rugs or furniture in walking pathways o ICE to the affected joint every three hours while awake for 30 minutes at a time, for at least the first 3-5 days, and then as needed for pain and swelling.  Continue to use ice for pain and swelling. You may notice swelling that will progress down to the foot and ankle.  This is normal after surgery.  Elevate your leg when you are not up walking on it.   o Continue to use the breathing machine you got in the hospital (incentive spirometer) which will help keep your temperature down.  It is common for your temperature to cycle up and down following surgery, especially at night when you are not up moving around and exerting yourself.  The breathing machine keeps your lungs expanded and your temperature down.   DIET:  As you were doing prior to hospitalization, we recommend a well-balanced diet.  DRESSING / WOUND CARE / SHOWERING  Keep the surgical dressing until follow up.  The dressing is water proof, so you can shower without any extra covering.  IF THE  DRESSING FALLS OFF or the wound gets wet inside, change the dressing with sterile gauze.  Please use good hand washing techniques before changing the dressing.  Do not use any lotions or creams on the incision until instructed by your surgeon.    ACTIVITY  o Increase activity slowly as tolerated, but follow the weight bearing instructions below.   o No driving for 6 weeks or until further direction given by your  physician.  You cannot drive while taking narcotics.  o No lifting or carrying greater than 10 lbs. until further directed by your surgeon. o Avoid periods of inactivity such as sitting longer than an hour when not asleep. This helps prevent blood clots.  o You may return to work once you are authorized by your doctor.     WEIGHT BEARING   Weight bearing as tolerated with assist device (walker, cane, etc) as directed, use it as long as suggested by your surgeon or therapist, typically at least 4-6 weeks.   EXERCISES  Results after joint replacement surgery are often greatly improved when you follow the exercise, range of motion and muscle strengthening exercises prescribed by your doctor. Safety measures are also important to protect the joint from further injury. Any time any of these exercises cause you to have increased pain or swelling, decrease what you are doing until you are comfortable again and then slowly increase them. If you have problems or questions, call your caregiver or physical therapist for advice.   Rehabilitation is important following a joint replacement. After just a few days of immobilization, the muscles of the leg can become weakened and shrink (atrophy).  These exercises are designed to build up the tone and strength of the thigh and leg muscles and to improve motion. Often times heat used for twenty to thirty minutes before working out will loosen up your tissues and help with improving the range of motion but do not use heat for the first two weeks following surgery (sometimes heat can increase post-operative swelling).   These exercises can be done on a training (exercise) mat, on the floor, on a table or on a bed. Use whatever works the best and is most comfortable for you.    Use music or television while you are exercising so that the exercises are a pleasant break in your day. This will make your life better with the exercises acting as a break in your routine that  you can look forward to.   Perform all exercises about fifteen times, three times per day or as directed.  You should exercise both the operative leg and the other leg as well.  Exercises include:    Quad Sets - Tighten up the muscle on the front of the thigh (Quad) and hold for 5-10 seconds.    Straight Leg Raises - With your knee straight (if you were given a brace, keep it on), lift the leg to 60 degrees, hold for 3 seconds, and slowly lower the leg.  Perform this exercise against resistance later as your leg gets stronger.   Leg Slides: Lying on your back, slowly slide your foot toward your buttocks, bending your knee up off the floor (only go as far as is comfortable). Then slowly slide your foot back down until your leg is flat on the floor again.   Angel Wings: Lying on your back spread your legs to the side as far apart as you can without causing discomfort.   Hamstring Strength:  Lying on  your back, push your heel against the floor with your leg straight by tightening up the muscles of your buttocks.  Repeat, but this time bend your knee to a comfortable angle, and push your heel against the floor.  You may put a pillow under the heel to make it more comfortable if necessary.   A rehabilitation program following joint replacement surgery can speed recovery and prevent re-injury in the future due to weakened muscles. Contact your doctor or a physical therapist for more information on knee rehabilitation.    CONSTIPATION  Constipation is defined medically as fewer than three stools per week and severe constipation as less than one stool per week.  Even if you have a regular bowel pattern at home, your normal regimen is likely to be disrupted due to multiple reasons following surgery.  Combination of anesthesia, postoperative narcotics, change in appetite and fluid intake all can affect your bowels.   YOU MUST use at least one of the following options; they are listed in order of  increasing strength to get the job done.  They are all available over the counter, and you may need to use some, POSSIBLY even all of these options:    Drink plenty of fluids (prune juice may be helpful) and high fiber foods Colace 100 mg by mouth twice a day  Senokot for constipation as directed and as needed Dulcolax (bisacodyl), take with full glass of water  Miralax (polyethylene glycol) once or twice a day as needed.  If you have tried all these things and are unable to have a bowel movement in the first 3-4 days after surgery call either your surgeon or your primary doctor.    If you experience loose stools or diarrhea, hold the medications until you stool forms back up.  If your symptoms do not get better within 1 week or if they get worse, check with your doctor.  If you experience "the worst abdominal pain ever" or develop nausea or vomiting, please contact the office immediately for further recommendations for treatment.   ITCHING:  If you experience itching with your medications, try taking only a single pain pill, or even half a pain pill at a time.  You can also use Benadryl over the counter for itching or also to help with sleep.   TED HOSE STOCKINGS:  Use stockings on both legs until for at least 2 weeks or as directed by physician office. They may be removed at night for sleeping.  MEDICATIONS:  See your medication summary on the After Visit Summary that nursing will review with you.  You may have some home medications which will be placed on hold until you complete the course of blood thinner medication.  It is important for you to complete the blood thinner medication as prescribed.  PRECAUTIONS:  If you experience chest pain or shortness of breath - call 911 immediately for transfer to the hospital emergency department.   If you develop a fever greater that 101 F, purulent drainage from wound, increased redness or drainage from wound, foul odor from the wound/dressing, or  calf pain - CONTACT YOUR SURGEON.                                                   FOLLOW-UP APPOINTMENTS:  If you do not already have a post-op appointment, please  call the office for an appointment to be seen by your surgeon.  Guidelines for how soon to be seen are listed in your After Visit Summary, but are typically between 1-4 weeks after surgery.  OTHER INSTRUCTIONS:   Knee Replacement:  Do not place pillow under knee, focus on keeping the knee straight while resting. CPM instructions: 0-90 degrees, 2 hours in the morning, 2 hours in the afternoon, and 2 hours in the evening. Place foam block, curve side up under heel at all times except when in CPM or when walking.  DO NOT modify, tear, cut, or change the foam block in any way.  MAKE SURE YOU:   Understand these instructions.   Get help right away if you are not doing well or get worse.    Thank you for letting us be a part of your medical care team.  It is a privilege we respect greatly.  We hope these instructions will help you stay on track for a fast and full recovery!

## 2016-04-10 NOTE — Progress Notes (Signed)
Dr. Kathryne Hitch in to see the patient. States his surgery is on the left knee. Order placed and Or called and informed.

## 2016-04-11 ENCOUNTER — Encounter (HOSPITAL_COMMUNITY): Payer: Self-pay | Admitting: Orthopedic Surgery

## 2016-04-11 DIAGNOSIS — M1711 Unilateral primary osteoarthritis, right knee: Secondary | ICD-10-CM | POA: Diagnosis not present

## 2016-04-11 DIAGNOSIS — Z96651 Presence of right artificial knee joint: Secondary | ICD-10-CM | POA: Diagnosis not present

## 2016-04-11 LAB — BASIC METABOLIC PANEL
ANION GAP: 6 (ref 5–15)
BUN: 9 mg/dL (ref 6–20)
CALCIUM: 8.5 mg/dL — AB (ref 8.9–10.3)
CO2: 25 mmol/L (ref 22–32)
Chloride: 102 mmol/L (ref 101–111)
Creatinine, Ser: 1.02 mg/dL (ref 0.61–1.24)
Glucose, Bld: 134 mg/dL — ABNORMAL HIGH (ref 65–99)
Potassium: 4.3 mmol/L (ref 3.5–5.1)
Sodium: 133 mmol/L — ABNORMAL LOW (ref 135–145)

## 2016-04-11 LAB — CBC
HEMATOCRIT: 36.8 % — AB (ref 39.0–52.0)
HEMOGLOBIN: 12.5 g/dL — AB (ref 13.0–17.0)
MCH: 30.6 pg (ref 26.0–34.0)
MCHC: 34 g/dL (ref 30.0–36.0)
MCV: 90 fL (ref 78.0–100.0)
Platelets: 244 10*3/uL (ref 150–400)
RBC: 4.09 MIL/uL — AB (ref 4.22–5.81)
RDW: 13.2 % (ref 11.5–15.5)
WBC: 10 10*3/uL (ref 4.0–10.5)

## 2016-04-11 NOTE — Progress Notes (Signed)
Subjective: 1 Day Post-Op Procedure(s) (LRB): LEFT TOTAL KNEE ARTHROPLASTY (Left) Patient reports pain as moderate.    Objective: Vital signs in last 24 hours: Temp:  [97 F (36.1 C)-98.6 F (37 C)] 98.6 F (37 C) (11/02 0453) Pulse Rate:  [58-87] 78 (11/02 0453) Resp:  [9-20] 16 (11/02 0453) BP: (113-148)/(6-100) 130/6 (11/02 0453) SpO2:  [98 %-100 %] 99 % (11/02 0453) Weight:  [108.4 kg (239 lb)] 108.4 kg (239 lb) (11/01 0940)  Intake/Output from previous day: 11/01 0701 - 11/02 0700 In: 2310 [I.V.:1200; IV Piggyback:1110] Out: 1505 [Urine:1500; Blood:5] Intake/Output this shift: No intake/output data recorded.  No results for input(s): HGB in the last 72 hours. No results for input(s): WBC, RBC, HCT, PLT in the last 72 hours. No results for input(s): NA, K, CL, CO2, BUN, CREATININE, GLUCOSE, CALCIUM in the last 72 hours. No results for input(s): LABPT, INR in the last 72 hours.  Neurologically intact Neurovascular intact Sensation intact distally Intact pulses distally Dorsiflexion/Plantar flexion intact Incision: scant drainage No cellulitis present Compartment soft  Assessment/Plan: 1 Day Post-Op Procedure(s) (LRB): LEFT TOTAL KNEE ARTHROPLASTY (Left) Advance diet Up with therapy D/C IV fluids Discharge home with home health today so long as he continues to progress with PT WBAT LLE Place ted hose operative extremity  Fannie Knee 04/11/2016, 7:30 AM

## 2016-04-11 NOTE — Anesthesia Postprocedure Evaluation (Signed)
Anesthesia Post Note  Patient: [name]  Procedure(s) Performed: Procedure(s) (LRB): LEFT TOTAL KNEE ARTHROPLASTY (Left)  Patient location during evaluation: PACU Anesthesia Type: Spinal Level of consciousness: oriented and awake and alert Pain management: pain level controlled Vital Signs Assessment: post-procedure vital signs reviewed and stable Respiratory status: spontaneous breathing, respiratory function stable and patient connected to nasal cannula oxygen Cardiovascular status: blood pressure returned to baseline and stable Postop Assessment: no headache and no backache Anesthetic complications: no    Last Vitals:  Vitals:   04/11/16 0453 04/11/16 0833  BP: (!) 130/6 117/70  Pulse: 78   Resp: 16   Temp: 37 C     Last Pain:  Vitals:   04/11/16 1112  TempSrc:   PainSc: [place]

## 2016-04-11 NOTE — Progress Notes (Signed)
Orthopedic Tech Progress Note Patient Details:  [name] Oct 28, 1945 [id number]  CPM Left Knee CPM Left Knee: On Left Knee Flexion (Degrees): 50 Left Knee Extension (Degrees): 0 Additional Comments: LLE Applied CPM at 0-50.  Kristopher Oppenheim 04/11/2016, 7:04 AM

## 2016-04-11 NOTE — Evaluation (Signed)
Occupational Therapy Evaluation Patient Details Name: [name] MRN: [id number] DOB: [birth year] Today's Date: 04/11/2016    History of Present Illness 70 y/o WM s/p L TKA (04/10/16).  PMH includes L4 and L5 fusion, HTN, migraines   Clinical Impression   Pt admitted with the above diagnoses and presents with below problem list. Pt will benefit from continued acute OT to address the below listed deficits and maximize independence with basic ADLs prior to d/c home. PTA pt was independent with ADLs. Pt is currently set up to min guard with ADLs. Session details below.      Follow Up Recommendations  No OT follow up;Supervision - Intermittent;Other (comment) (OOB/mobility)    Equipment Recommendations  None recommended by OT;Other (comment) (BSC already delivered)    Recommendations for Other Services       Precautions / Restrictions Precautions Precautions: Knee Precaution Booklet Issued: Yes (comment) Restrictions Weight Bearing Restrictions: Yes LLE Weight Bearing: Weight bearing as tolerated      Mobility Bed Mobility Overal bed mobility: Modified Independent Bed Mobility: Supine to Sit     Supine to sit: Modified independent (Device/Increase time)     General bed mobility comments: up in chair  Transfers Overall transfer level: Needs assistance Equipment used: Rolling walker (2 wheeled) Transfers: Sit to/from Stand Sit to Stand: Supervision         General transfer comment: from recliner    Balance Overall balance assessment: Needs assistance         Standing balance support: Bilateral upper extremity supported;No upper extremity supported Standing balance-Leahy Scale: Good                              ADL Overall ADL's : Needs assistance/impaired Eating/Feeding: Set up;Sitting   Grooming: Supervision/safety;Oral care;Standing   Upper Body Bathing: Set up;Sitting   Lower Body Bathing: Min guard;Sit to/from stand   Upper Body  Dressing : Set up;Sitting   Lower Body Dressing: Min guard;Sit to/from stand   Toilet Transfer: Min guard;Ambulation;Comfort height toilet;Grab bars;RW   Toileting- Clothing Manipulation and Hygiene: Set up;Min guard;Sitting/lateral lean;Sit to/from stand   Tub/ Shower Transfer: Tub transfer;Min guard;Ambulation;3 in 1;Rolling walker   Functional mobility during ADLs: Min guard;Rolling walker General ADL Comments: Pt completed LB dressing and grooming task at sink. ADL education provided. Issued handout for tub transfer to 3n1.      Vision     Perception     Praxis      Pertinent Vitals/Pain Pain Assessment: 0-10 Pain Score: 7  Pain Location: L knee and quad Pain Descriptors / Indicators: Aching;Heaviness Pain Intervention(s): Limited activity within patient's tolerance;Monitored during session;Repositioned;Ice applied     Hand Dominance     Extremity/Trunk Assessment Upper Extremity Assessment Upper Extremity Assessment: Overall WFL for tasks assessed   Lower Extremity Assessment Lower Extremity Assessment: Defer to PT evaluation LLE Deficits / Details: Fair quad set with AAROM L Knee flex grossly 50 degrees with tightness and swelling.  Iced at end of session   Cervical / Trunk Assessment Cervical / Trunk Assessment: Normal   Communication Communication Communication: No difficulties   Cognition Arousal/Alertness: Awake/alert Behavior During Therapy: WFL for tasks assessed/performed Overall Cognitive Status: Within Functional Limits for tasks assessed                     General Comments       Exercises       Shoulder Instructions  Home Living Family/patient expects to be discharged to:: Private residence Living Arrangements: Spouse/significant other Available Help at Discharge: Family;Available 24 hours/day Type of Home: House Home Access: Level entry     Home Layout: Multi-level Alternate Level Stairs-Number of Steps: 8 Alternate  Level Stairs-Rails: Left;Right;Can reach both Bathroom Shower/Tub: Tub/shower unit Shower/tub characteristics: Door Bathroom Toilet: Handicapped height     Home Equipment: Environmental consultant - 2 wheels;Bedside commode;Other (comment)   Additional Comments: CPM already delivererd      Prior Functioning/Environment Level of Independence: Independent                 OT Problem List: Impaired balance (sitting and/or standing)   OT Treatment/Interventions: Self-care/ADL training;DME and/or AE instruction;Therapeutic activities;Patient/family education;Balance training    OT Goals(Current goals can be found in the care plan section) Acute Rehab OT Goals Patient Stated Goal: go home OT Goal Formulation: With patient Time For Goal Achievement: 04/18/16 Potential to Achieve Goals: Good ADL Goals Pt Will Perform Tub/Shower Transfer: Tub transfer;with supervision;ambulating;shower seat;3 in 1;rolling walker  OT Frequency: Min 1X/week   Barriers to D/C:            Co-evaluation              End of Session Equipment Utilized During Treatment: Rolling walker CPM Left Knee CPM Left Knee: Off  Activity Tolerance: Patient tolerated treatment well Patient left: in chair;with call bell/phone within reach   Time: 1031-1050 OT Time Calculation (min): 19 min Charges:  OT General Charges $OT Visit: 1 Procedure OT Evaluation $OT Eval Low Complexity: 1 Procedure G-Codes:    Hortencia Pilar 04/23/2016, 11:08 AM

## 2016-04-11 NOTE — Progress Notes (Signed)
Physical Therapy Treatment Patient Details Name: [name] MRN: [id number] DOB: 09/20/1945 Today's Date: 04/11/2016    History of Present Illness 70 y/o WM s/p L TKA (04/10/16).  PMH includes L4 and L5 fusion, HTN, migraines    PT Comments    Pt doing well. Anticipate home today.  Recommend HHPT to continue progress.  Follow Up Recommendations  Home health PT     Equipment Recommendations  None recommended by PT    Recommendations for Other Services       Precautions / Restrictions Precautions Precautions: Knee Precaution Booklet Issued: Yes (comment) Restrictions Weight Bearing Restrictions: Yes LLE Weight Bearing: Weight bearing as tolerated    Mobility  Bed Mobility Overal bed mobility: Modified Independent Bed Mobility: Supine to Sit     Supine to sit: Modified independent (Device/Increase time)     General bed mobility comments: Pt standing up upon arrival  Transfers Overall transfer level: Needs assistance Equipment used: Rolling walker (2 wheeled) Transfers: Sit to/from Stand Sit to Stand: Supervision         General transfer comment: Pt standing up upon arrival  Ambulation/Gait Ambulation/Gait assistance: Supervision Ambulation Distance (Feet): 200 Feet Assistive device: Rolling walker (2 wheeled) Gait Pattern/deviations: Step-through pattern;Antalgic Gait velocity: decreased Gait velocity interpretation: Below normal speed for age/gender General Gait Details: Worked on heel toe pattern.   Stairs Stairs: Yes Stairs assistance: Supervision Stair Management: Two rails;Step to pattern;Forwards Number of Stairs: 5 General stair comments: Pt able to manage steps with S and step to pattern  Wheelchair Mobility    Modified Rankin (Stroke Patients Only)       Balance Overall balance assessment: Needs assistance         Standing balance support: Bilateral upper extremity supported;No upper extremity supported Standing balance-Leahy  Scale: Good                      Cognition Arousal/Alertness: Awake/alert Behavior During Therapy: WFL for tasks assessed/performed Overall Cognitive Status: Within Functional Limits for tasks assessed                      Exercises Total Joint Exercises Ankle Circles/Pumps: AROM;Both;Supine;10 reps Heel Slides: AAROM;Left;10 reps;Supine;AROM Hip ABduction/ADduction: AAROM;Left;10 reps;Supine Straight Leg Raises: AAROM;Left;10 reps;Supine Long Arc Quad: 10 reps;Left;Seated Knee Flexion: AROM;Left;10 reps;Seated;Other (comment) (with wash cloth under foot)    General Comments        Pertinent Vitals/Pain Pain Assessment: 0-10 Pain Score: 6  Pain Location: L knee Pain Descriptors / Indicators: Aching;Operative site guarding Pain Intervention(s): Monitored during session;Limited activity within patient's tolerance    Home Living Family/patient expects to be discharged to:: Private residence Living Arrangements: Spouse/significant other Available Help at Discharge: Family;Available 24 hours/day Type of Home: House Home Access: Level entry   Home Layout: Multi-level Home Equipment: Walker - 2 wheels;Bedside commode;Other (comment) Additional Comments: CPM already delivererd    Prior Function Level of Independence: Independent          PT Goals (current goals can now be found in the care plan section) Acute Rehab PT Goals Patient Stated Goal: go home PT Goal Formulation: With patient Time For Goal Achievement: 04/14/16 Potential to Achieve Goals: Good Progress towards PT goals: Progressing toward goals    Frequency    7X/week      PT Plan Current plan remains appropriate    Co-evaluation             End of Session Equipment Utilized During  Treatment: Gait belt Activity Tolerance: Patient tolerated treatment well Patient left: in chair;with call bell/phone within reach;with family/visitor present     Time: 1200-1217 PT Time  Calculation (min) (ACUTE ONLY): 17 min  Charges:  $Gait Training: 8-22 mins $Therapeutic Exercise: 8-22 mins                    G Codes:      Saramarie Stinger LUBECK 04/11/2016, 12:46 PM

## 2016-04-11 NOTE — Op Note (Signed)
[name], [name] NO.:  [id number]  MEDICAL RECORD NO.:  [id number]  LOCATION:                               FACILITY:  [facility]  PHYSICIAN:  Ninetta Lights, M.D. DATE OF BIRTH:  12-Nov-1945  DATE OF PROCEDURE: DATE OF DISCHARGE:                              OPERATIVE REPORT   PREOPERATIVE DIAGNOSIS:  Left knee end-stage arthritis, primary generalized.  POSTOPERATIVE DIAGNOSIS:  Left knee end-stage arthritis, primary generalized.  PROCEDURE:  Modified minimally-invasive left total knee replacement, Stryker triathlon prosthesis.  A cemented pegged posterior stabilized #5 femoral component.  Cemented #6 tibial component, 9 mm PS insert. Cemented resurfacing 38 mm patellar component.  SURGEON:  Ninetta Lights, M.D.  ASSISTANT:  Elmyra Ricks, P.A., who present throughout the entire case and necessary for timely completion of procedure.  ANESTHESIA:  Spinal.  BLOOD LOSS:  Minimal.  SPECIMENS:  None.  CULTURES:  None.  COMPLICATIONS:  None.  DRESSINGS:  Soft compressive knee immobilizer.  TOURNIQUET TIME:  1 hour.  DESCRIPTION OF PROCEDURE:  The patient was brought to operating room and after adequate anesthesia had been obtained, tourniquet applied. Prepped and draped in usual sterile fashion.  Exsanguinated with elevation of Esmarch.  Tourniquet inflated to 350 mmHg.  Longitudinal incision above the patella down to tibial tubercle.  Skin and subcutaneous tissue divided.  Medial arthrotomy, vastus splitting. Grade 4 changes throughout.  A little bit of bone loss laterally. Flexible intramedullary guide distal femur.  8 mm resection 5 degrees valgus.  Using epicondylar axis, the femur was sized, cut, and fitted for a pegged, posterior stabilized #5 component.  Proximal tibial resection with extramedullary guide.  Size for #6 component.  Rotation set with trials.  Resurfacing of patella after 10 mm resection with a 38- mm component.  With  the 9 mm insert, very pleased with balancing, stability, full motion, good patellar tracking.  Trials removed. Copious irrigation with pulse irrigating device.  Cement prepared, placed on all components, firmly seated.  Polyethylene attached to tibia, knee reduced.  Patella held with clamp.  Once the cement hardened, the knee was irrigated again.  Soft tissue was injected with Exparel.  Arthrotomy closed with #1 Vicryl.  Subcutaneous and subcuticular closure.  Margins were injected with Marcaine.  Sterile compressive dressing applied. Tourniquet deflated and removed.  Knee immobilizer applied.  Anesthesia reversed.  Brought to the recovery room.  Tolerated the surgery well. No complications.     Ninetta Lights, M.D.   ______________________________ Ninetta Lights, M.D.    DFM/MEDQ  D:  04/10/2016  T:  04/11/2016  Job:  [id number]

## 2016-04-11 NOTE — Progress Notes (Signed)
Orthopedic Tech Progress Note Patient Details:  [name] [birth year] [id number]  Patient ID: [name], male   DOB: January 14, 1946, 70 y.o.   MRN: [id number]   [name] 04/11/2016, 1:43 PM Placed pt's lle on cpm @0 -60 degrees @1345 ; RN notified

## 2016-04-11 NOTE — Progress Notes (Signed)
Patient discharged to home, discharge instructions given, patient stated he understood

## 2016-04-11 NOTE — Care Management Note (Signed)
Case Management Note  Patient Details  Name: GEON CLAUDE MRN: [id number] Date of Birth: April 01, 1946  Subjective/Objective:  70 yr old gentleman s/p left total knee arthroplasty.                  Action/Plan: Case manager spoke with patient and wife concerning [facility] and DME needs. Patient was preoperatively set up with [facility], no changes. Rolling walker, 3in1 and CPM have been delivered to patient's home.    Expected Discharge Date:    04/11/16              Expected Discharge Plan:   Home with Home Health  In-House Referral:     Discharge planning Services  CM Consult  Post Acute Care Choice:  Home Health Choice offered to:  Patient, Spouse  DME Arranged:  N/A DME Agency:  NA  HH Arranged:  PT [place] Agency:  [facility]  Status of Service:     If discussed at [place] of Stay Meetings, dates discussed:    Additional Comments:  Ninfa Meeker, RN 04/11/2016, 2:26 PM

## 2016-04-11 NOTE — Evaluation (Signed)
Physical Therapy Evaluation Patient Details Name: [name] MRN: [id number] DOB: 18-Aug-1945 Today's Date: 04/11/2016   History of Present Illness  70 y/o WM s/p L TKA (04/10/16).  PMH includes L4 and L5 fusion, HTN, migraines  Clinical Impression  Pt admitted with above diagnosis. Pt currently with functional limitations due to the deficits listed below (see PT Problem List). Pt will benefit from skilled PT to increase their independence and safety with mobility to allow discharge to the venue listed below.  Pt is moving very well for first day post op.  He was able to ambulate in room to bathroom and down hallway with RW.  Also initiated stair training.  Pt is at a safe level for d/c home from a rehab standpoint, but will continue to follow acutely to maximize his rehab potential and progress.      Follow Up Recommendations Home health PT    Equipment Recommendations  None recommended by PT    Recommendations for Other Services       Precautions / Restrictions Precautions Precautions: Knee Precaution Booklet Issued: Yes (comment) Restrictions Weight Bearing Restrictions: Yes LLE Weight Bearing: Weight bearing as tolerated      Mobility  Bed Mobility Overal bed mobility: Modified Independent Bed Mobility: Supine to Sit     Supine to sit: Modified independent (Device/Increase time)     General bed mobility comments: Slowly able to come to EOB with HOB elevated  Transfers Overall transfer level: Needs assistance Equipment used: Rolling walker (2 wheeled) Transfers: Sit to/from Stand Sit to Stand: Supervision         General transfer comment: cues for hand placement  Ambulation/Gait Ambulation/Gait assistance: Supervision Ambulation Distance (Feet): 220 Feet (x2) Assistive device: Rolling walker (2 wheeled) Gait Pattern/deviations: Step-through pattern;Decreased stance time - left Gait velocity: decreased Gait velocity interpretation: Below normal speed for  age/gender General Gait Details: Pt able to ambulate with step through pattern with good posture and sequencing with RW  Stairs Stairs: Yes Stairs assistance: Supervision Stair Management: Two rails;Step to pattern;Forwards Number of Stairs: 5 General stair comments: Pt able to manage steps with S and step to pattern  Wheelchair Mobility    Modified Rankin (Stroke Patients Only)       Balance Overall balance assessment: No apparent balance deficits (not formally assessed)                                           Pertinent Vitals/Pain Pain Assessment: 0-10 Pain Score: 7  Pain Location: L knee and quad Pain Descriptors / Indicators: Aching;Heaviness Pain Intervention(s): Limited activity within patient's tolerance;Monitored during session;Ice applied    Home Living Family/patient expects to be discharged to:: Private residence Living Arrangements: Spouse/significant other Available Help at Discharge: Family;Available 24 hours/day Type of Home: House Home Access: Level entry     Home Layout: Multi-level Home Equipment: Walker - 2 wheels;Bedside commode;Other (comment) Additional Comments: CPM already delivererd    Prior Function Level of Independence: Independent               Hand Dominance        Extremity/Trunk Assessment   Upper Extremity Assessment: Defer to OT evaluation           Lower Extremity Assessment: LLE deficits/detail   LLE Deficits / Details: Fair quad set with AAROM L Knee flex grossly 50 degrees with tightness and swelling.  Iced at end of session  Cervical / Trunk Assessment: Normal  Communication   Communication: No difficulties  Cognition Arousal/Alertness: Awake/alert Behavior During Therapy: WFL for tasks assessed/performed Overall Cognitive Status: Within Functional Limits for tasks assessed                      General Comments      Exercises Total Joint Exercises Ankle Circles/Pumps:  AROM;Both;Supine;10 reps Heel Slides: AAROM;Left;10 reps;Supine;AROM Hip ABduction/ADduction: AAROM;Left;10 reps;Supine Straight Leg Raises: AAROM;Left;10 reps;Supine   Assessment/Plan    PT Assessment Patient needs continued PT services  PT Problem List Decreased strength;Decreased range of motion;Decreased activity tolerance;Decreased balance;Decreased knowledge of use of DME;Decreased mobility          PT Treatment Interventions DME instruction;Gait training;Stair training;Functional mobility training;Therapeutic activities;Therapeutic exercise    PT Goals (Current goals can be found in the Care Plan section)  Acute Rehab PT Goals Patient Stated Goal: go home PT Goal Formulation: With patient Time For Goal Achievement: 04/14/16 Potential to Achieve Goals: Good    Frequency 7X/week   Barriers to discharge        Co-evaluation               End of Session Equipment Utilized During Treatment: Gait belt Activity Tolerance: Patient tolerated treatment well Patient left: in chair;with call bell/phone within reach           Time: 0931-1018 PT Time Calculation (min) (ACUTE ONLY): 47 min   Charges:   PT Evaluation $PT Eval Moderate Complexity: 1 Procedure PT Treatments $Gait Training: 8-22 mins $Therapeutic Exercise: 8-22 mins   PT G Codes:        Shellee Streng LUBECK 04/11/2016, 10:38 AM

## 2016-04-11 NOTE — Op Note (Deleted)
  The note originally documented on this encounter has been moved the the encounter in which it belongs.  

## 2016-04-12 DIAGNOSIS — Z471 Aftercare following joint replacement surgery: Secondary | ICD-10-CM | POA: Diagnosis not present

## 2016-04-12 DIAGNOSIS — Z96652 Presence of left artificial knee joint: Secondary | ICD-10-CM | POA: Diagnosis not present

## 2016-04-12 DIAGNOSIS — I10 Essential (primary) hypertension: Secondary | ICD-10-CM | POA: Diagnosis not present

## 2016-04-16 DIAGNOSIS — I10 Essential (primary) hypertension: Secondary | ICD-10-CM | POA: Diagnosis not present

## 2016-04-16 DIAGNOSIS — Z471 Aftercare following joint replacement surgery: Secondary | ICD-10-CM | POA: Diagnosis not present

## 2016-04-16 DIAGNOSIS — Z96652 Presence of left artificial knee joint: Secondary | ICD-10-CM | POA: Diagnosis not present

## 2016-04-18 DIAGNOSIS — Z471 Aftercare following joint replacement surgery: Secondary | ICD-10-CM | POA: Diagnosis not present

## 2016-04-18 DIAGNOSIS — Z96652 Presence of left artificial knee joint: Secondary | ICD-10-CM | POA: Diagnosis not present

## 2016-04-18 DIAGNOSIS — I10 Essential (primary) hypertension: Secondary | ICD-10-CM | POA: Diagnosis not present

## 2016-04-22 DIAGNOSIS — Z471 Aftercare following joint replacement surgery: Secondary | ICD-10-CM | POA: Diagnosis not present

## 2016-04-22 DIAGNOSIS — Z96652 Presence of left artificial knee joint: Secondary | ICD-10-CM | POA: Diagnosis not present

## 2016-04-22 DIAGNOSIS — I10 Essential (primary) hypertension: Secondary | ICD-10-CM | POA: Diagnosis not present

## 2016-04-23 DIAGNOSIS — M1712 Unilateral primary osteoarthritis, left knee: Secondary | ICD-10-CM | POA: Diagnosis not present

## 2016-05-07 DIAGNOSIS — Z96652 Presence of left artificial knee joint: Secondary | ICD-10-CM | POA: Diagnosis not present

## 2016-05-07 DIAGNOSIS — M25562 Pain in left knee: Secondary | ICD-10-CM | POA: Diagnosis not present

## 2016-05-07 DIAGNOSIS — M25662 Stiffness of left knee, not elsewhere classified: Secondary | ICD-10-CM | POA: Diagnosis not present

## 2016-05-07 DIAGNOSIS — R531 Weakness: Secondary | ICD-10-CM | POA: Diagnosis not present

## 2016-05-10 DIAGNOSIS — M25562 Pain in left knee: Secondary | ICD-10-CM | POA: Diagnosis not present

## 2016-05-10 DIAGNOSIS — R531 Weakness: Secondary | ICD-10-CM | POA: Diagnosis not present

## 2016-05-10 DIAGNOSIS — M25662 Stiffness of left knee, not elsewhere classified: Secondary | ICD-10-CM | POA: Diagnosis not present

## 2016-05-10 DIAGNOSIS — Z96652 Presence of left artificial knee joint: Secondary | ICD-10-CM | POA: Diagnosis not present

## 2016-05-17 DIAGNOSIS — R531 Weakness: Secondary | ICD-10-CM | POA: Diagnosis not present

## 2016-05-17 DIAGNOSIS — M25662 Stiffness of left knee, not elsewhere classified: Secondary | ICD-10-CM | POA: Diagnosis not present

## 2016-05-17 DIAGNOSIS — Z96652 Presence of left artificial knee joint: Secondary | ICD-10-CM | POA: Diagnosis not present

## 2016-05-17 DIAGNOSIS — M25562 Pain in left knee: Secondary | ICD-10-CM | POA: Diagnosis not present

## 2016-05-21 DIAGNOSIS — Z96652 Presence of left artificial knee joint: Secondary | ICD-10-CM | POA: Diagnosis not present

## 2016-07-02 DIAGNOSIS — Z96652 Presence of left artificial knee joint: Secondary | ICD-10-CM | POA: Diagnosis not present

## 2016-07-16 DIAGNOSIS — H53002 Unspecified amblyopia, left eye: Secondary | ICD-10-CM | POA: Diagnosis not present

## 2016-07-16 DIAGNOSIS — Z9889 Other specified postprocedural states: Secondary | ICD-10-CM | POA: Diagnosis not present

## 2016-07-16 DIAGNOSIS — H2511 Age-related nuclear cataract, right eye: Secondary | ICD-10-CM | POA: Diagnosis not present

## 2016-07-16 DIAGNOSIS — H35371 Puckering of macula, right eye: Secondary | ICD-10-CM | POA: Diagnosis not present

## 2016-07-16 DIAGNOSIS — Z961 Presence of intraocular lens: Secondary | ICD-10-CM | POA: Diagnosis not present

## 2016-07-26 DIAGNOSIS — M1711 Unilateral primary osteoarthritis, right knee: Secondary | ICD-10-CM | POA: Diagnosis not present

## 2016-07-26 NOTE — H&P (Signed)
TOTAL KNEE ADMISSION H&P  Patient is being admitted for right total knee arthroplasty.  Subjective:  Chief Complaint:right knee pain.  HPI: [name], 71 y.o. male, has a history of pain and functional disability in the right knee due to arthritis and has failed non-surgical conservative treatments for greater than 12 weeks to includeNSAID's and/or analgesics, corticosteriod injections and activity modification.  Onset of symptoms was gradual, starting 4 years ago with gradually worsening course since that time. The patient noted no past surgery on the right knee(s).  Patient currently rates pain in the right knee(s) at 4 out of 10 with activity. Patient has night pain, crepitus and joint swelling.  Patient has evidence of subchondral sclerosis and joint space narrowing by imaging studies. There is no active infection.  Patient Active Problem List   Diagnosis Date Noted  . Primary localized osteoarthritis of left knee 04/10/2016  . Dyspnea 07/19/2013  . COLONIC POLYPS, ADENOMATOUS 08/25/2007  . Hyperlipidemia 08/25/2007  . DEPRESSION 08/25/2007  . Essential hypertension 08/25/2007  . EXTERNAL HEMORRHOIDS 08/25/2007  . ASTHMA 08/25/2007  . ESOPHAGITIS 08/25/2007  . GASTROESOPHAGEAL REFLUX DISEASE 08/25/2007  . GASTRITIS 08/25/2007  . DIVERTICULOSIS OF COLON 08/25/2007  . OSTEOARTHRITIS 08/25/2007  . SLEEP APNEA 08/25/2007  . PYROSIS 08/25/2007  . EPIGASTRIC PAIN 08/25/2007   Past Medical History:  Diagnosis Date  . Asthma    as a child  . Colon polyps   . Depression   . Dyspnea   . Epigastric pain   . Hard of hearing   . Headache    migraines  . History of bronchitis   . History of pneumonia   . Hyperlipidemia   . Hypertension   . Knee pain   . Nausea    Occasional  . Osteoarthritis   . Sigmoid diverticulosis   . Sleep apnea   . Wears glasses     Past Surgical History:  Procedure Laterality Date  . APPENDECTOMY    . BACK SURGERY    . CARDIAC CATHETERIZATION  Left    cataract  . COLONOSCOPY  11/06/2004   "several"  . HERNIA REPAIR    . MASS EXCISION Right 01/26/2015   Procedure: REMOVAL OF RIGHT CHEST WALL MASS;  Surgeon: Jackolyn Confer, MD;  Location: [facility];  Service: General;  Laterality: Right;  . MULTIPLE TOOTH EXTRACTIONS    . POLYPECTOMY    . SHOULDER SURGERY Right   . TONSILLECTOMY    . TOTAL KNEE ARTHROPLASTY Left 04/10/2016   Procedure: LEFT TOTAL KNEE ARTHROPLASTY;  Surgeon: Ninetta Lights, MD;  Location: [facility];  Service: Orthopedics;  Laterality: Left;  LEFT TOTAL KNEE ARTHROPLASTY    No prescriptions prior to admission.   Allergies  Allergen Reactions  . Amoxicillin Hives and Rash    Has patient had a PCN reaction causing immediate rash, facial/tongue/throat swelling, [name]: Yes Has patient had a PCN reaction causing severe rash involving mucus membranes or skin necrosis: Yes Has patient had a PCN reaction that required hospitalization No Has patient had a PCN reaction occurring within the last 10 years: Yes If all of the above answers are "NO", then may proceed with Cephalosporin use.   . Toprol Xl [Metoprolol Succinate] Other (See Comments)    Pt states is " like a govenor on a bus"   . Atorvastatin Other (See Comments)    Weakness in muscles  . Statins Other (See Comments)    Weakness in muscles  .  Codeine Other (See Comments)    *  *  Pt does not have allergy to codeine but father had severe reaction to codeine so he never wants to be given to him  *  *    Social History  Substance Use Topics  . Smoking status: Former Smoker    Types: Cigarettes    Quit date: 04/29/1983  . Smokeless tobacco: Never Used  . Alcohol use 1.2 oz/week    1 Glasses of wine, 1 Cans of beer per week     Comment: occ alcohol    Family History  Problem Relation Age of Onset  . Heart disease Father   . Cancer Maternal Grandfather     Colon Cancer  . Colon cancer Maternal Grandfather       Review of Systems  Constitutional: Negative.   HENT: Negative.   Eyes: Negative.   Respiratory: Negative.   Cardiovascular: Negative.   Gastrointestinal: Negative.   Genitourinary: Negative.   Musculoskeletal: Positive for joint pain.  Skin: Negative.   Neurological: Negative.   Endo/Heme/Allergies: Negative.   Psychiatric/Behavioral: Negative.     Objective:  Physical Exam  Constitutional: He is oriented to person, place, and time. He appears well-developed and well-nourished.  HENT:  Head: Normocephalic and atraumatic.  Eyes: EOM are normal. Pupils are equal, round, and reactive to light.  Neck: Normal range of motion. Neck supple.  Cardiovascular: Normal rate and regular rhythm.   Respiratory: Effort normal and breath sounds normal.  GI: Soft. Bowel sounds are normal.  Musculoskeletal:  Examination of his right knee reveals range of motion from 0-120 degrees.  Medial and lateral joint line tenderness.  Ligaments are stable.  Moderate patellofemoral crepitus.  Neurovascularly intact distally.    Neurological: He is alert and oriented to person, place, and time.  Skin: Skin is warm and dry.  Psychiatric: He has a normal mood and affect. His behavior is normal. Judgment and thought content normal.    Vital signs in last 24 hours:    Labs:   Estimated body mass index is 34.29 kg/m as calculated from the following:   Height as of 04/10/16: 5\' 10"  (1.778 m).   Weight as of 04/10/16: 108.4 kg (239 lb).   Imaging Review Plain radiographs demonstrate severe degenerative joint disease of the right knee(s). The overall alignment ismild varus. The bone quality appears to be fair for age and reported activity level.  Assessment/Plan:  End stage arthritis, right knee   The patient history, physical examination, clinical judgment of the provider and imaging studies are consistent with end stage degenerative joint disease of the right knee(s) and total knee arthroplasty  is deemed medically necessary. The treatment options including medical management, injection therapy arthroscopy and arthroplasty were discussed at length. The risks and benefits of total knee arthroplasty were presented and reviewed. The risks due to aseptic loosening, infection, stiffness, patella tracking problems, thromboembolic complications and other imponderables were discussed. The patient acknowledged the explanation, agreed to proceed with the plan and consent was signed. Patient is being admitted for inpatient treatment for surgery, pain control, PT, OT, prophylactic antibiotics, VTE prophylaxis, progressive ambulation and ADL's and discharge planning. The patient is planning to be discharged home with home health services

## 2016-08-05 ENCOUNTER — Encounter (HOSPITAL_COMMUNITY): Payer: Self-pay

## 2016-08-05 ENCOUNTER — Encounter (HOSPITAL_COMMUNITY)
Admission: RE | Admit: 2016-08-05 | Discharge: 2016-08-05 | Disposition: A | Discharge status: Home / Self Care | Payer: PPO | Source: Ambulatory Visit | Attending: Orthopedic Surgery | Admitting: Orthopedic Surgery

## 2016-08-05 DIAGNOSIS — Z01812 Encounter for preprocedural laboratory examination: Secondary | ICD-10-CM | POA: Diagnosis not present

## 2016-08-05 LAB — CBC
HCT: 44.7 % (ref 39.0–52.0)
HEMOGLOBIN: 14.9 g/dL (ref 13.0–17.0)
MCH: 29 pg (ref 26.0–34.0)
MCHC: 33.3 g/dL (ref 30.0–36.0)
MCV: 87 fL (ref 78.0–100.0)
Platelets: 285 10*3/uL (ref 150–400)
RBC: 5.14 MIL/uL (ref 4.22–5.81)
RDW: 13.8 % (ref 11.5–15.5)
WBC: 7.4 10*3/uL (ref 4.0–10.5)

## 2016-08-05 LAB — BASIC METABOLIC PANEL
ANION GAP: 9 (ref 5–15)
BUN: 17 mg/dL (ref 6–20)
CALCIUM: 9.4 mg/dL (ref 8.9–10.3)
CO2: 26 mmol/L (ref 22–32)
Chloride: 104 mmol/L (ref 101–111)
Creatinine, Ser: 1.14 mg/dL (ref 0.61–1.24)
GFR calc Af Amer: >60 mL/min (ref >60)
GLUCOSE: 92 mg/dL (ref 65–99)
Potassium: 3.7 mmol/L (ref 3.5–5.1)
SODIUM: 139 mmol/L (ref 135–145)

## 2016-08-05 LAB — TYPE AND SCREEN
ABO/RH(D): O POS
Antibody Screen: NEGATIVE

## 2016-08-05 LAB — SURGICAL PCR SCREEN
MRSA, PCR: NEGATIVE
STAPHYLOCOCCUS AUREUS: POSITIVE — AB

## 2016-08-05 NOTE — Pre-Procedure Instructions (Signed)
DRAXTON PARROW  08/05/2016      [facility] ([place]), [place] [id number] [place] ([place]) [place] [address] Phone: [phone] Fax: [phone]  [place], [place] - [address] [place] [address] Phone: [phone] Fax: [phone]    Your procedure is scheduled on 12-14-2016  Wednesday   Report to [facility] Admitting at 7:45 A.M.   Call this number if you have problems the morning of surgery:  [phone]   Remember:  Do not eat food or drink liquids after midnight.   Take these medicines the morning of surgery with A SIP OF WATER Ondansetron(Zofran) if needed,Afrin nasal spray if needed,Refresh eye drops        STOP ASPIRIN,ANTIINFLAMATORIES (IBUPROFEN,ALEVE,MOTRIN,ADVIL,GOODY'S POWDERS),HERBAL SUPPLEMENTS,FISH OIL,AND VITAMINS 5-7 DAYS PRIOR TO SURGERY   Do not wear jewelry  Do not wear lotions, powders, or perfumes, or deoderant.  Do not shave 48 hours prior to surgery.  Men may shave face and neck.  Do not bring valuables to the hospital.   [facility] is not responsible for any belongings or valuables.  Contacts, dentures or bridgework may not be worn into surgery.  Leave your suitcase in the car.  After surgery it may be brought to your room.  For patients admitted to the hospital, discharge time will be determined by your treatment team.  Patients discharged the day of surgery will not be allowed to drive home.     Special Instructions: [facility] - Preparing for Surgery  Before surgery, you can play an important role.  Because skin is not sterile, your skin needs to be as free of germs as possible.  You can reduce the number of germs on you skin by washing with CHG (chlorahexidine gluconate) soap before surgery.  CHG is an antiseptic cleaner which kills germs and bonds with the skin to continue killing germs even after  washing.  Please DO NOT use if you have an allergy to CHG or antibacterial soaps.  If your skin becomes reddened/irritated stop using the CHG and inform your nurse when you arrive at Short Stay.  Do not shave (including legs and underarms) for at least 48 hours prior to the first CHG shower.  You may shave your face.  Please follow these instructions carefully:   1.  Shower with CHG Soap the night before surgery and the   morning of Surgery.  2.  If you choose to wash your hair, wash your hair first as usual with your normal shampoo.  3.  After you shampoo, rinse your hair and body thoroughly to remove the  Shampoo.  4.  Use CHG as you would any other liquid soap.  You can apply chg directly  to the skin and wash gently with scrungie or a clean washcloth.  5.  Apply the CHG Soap to your body ONLY FROM THE NECK DOWN.   Do not use on open wounds or open sores.  Avoid contact with your eyes,  ears, mouth and genitals (private parts).  Wash genitals (private parts) with your normal soap.  6.  Wash thoroughly, paying special attention to the area where your surgery will be performed.  7.  Thoroughly rinse your body with warm water from the neck down.  8.  DO NOT shower/wash with your normal soap after using and rinsing o  the CHG Soap.  9.  Fraser Din  yourself dry with a clean towel.            10.  Wear clean pajamas.            11.  Place clean sheets on your bed the night of your first shower and do not sleep with pets.  Day of Surgery  Do not apply any lotions/deodorants the morning of surgery.  Please wear clean clothes to the hospital/surgery center.

## 2016-08-13 MED ORDER — VANCOMYCIN HCL 10 G IV SOLR
1500.0000 mg | INTRAVENOUS | Status: AC
  Administered 2016-08-14: 1500 mg via INTRAVENOUS
  Filled 2016-08-13: qty 1500

## 2016-08-13 MED ORDER — LACTATED RINGERS IV SOLN
INTRAVENOUS | Status: DC
  Administered 2016-08-14 (×3): via INTRAVENOUS

## 2016-08-13 MED ORDER — TRANEXAMIC ACID 1000 MG/10ML IV SOLN
1000.0000 mg | INTRAVENOUS | Status: AC
  Administered 2016-08-14: 1000 mg via INTRAVENOUS
  Filled 2016-08-13: qty 10

## 2016-08-14 ENCOUNTER — Encounter (HOSPITAL_COMMUNITY): Payer: Self-pay | Admitting: *Deleted

## 2016-08-14 ENCOUNTER — Inpatient Hospital Stay (HOSPITAL_COMMUNITY): Payer: PPO | Admitting: Anesthesiology

## 2016-08-14 ENCOUNTER — Inpatient Hospital Stay (HOSPITAL_COMMUNITY)
Admission: RE | Admit: 2016-08-14 | Discharge: 2016-08-15 | DRG: 470 | Disposition: A | Discharge status: Home Health | Payer: PPO | Source: Ambulatory Visit | Attending: Orthopedic Surgery | Admitting: Orthopedic Surgery

## 2016-08-14 ENCOUNTER — Inpatient Hospital Stay (HOSPITAL_COMMUNITY): Payer: PPO

## 2016-08-14 ENCOUNTER — Encounter (HOSPITAL_COMMUNITY)
Admission: RE | Disposition: A | Discharge status: Home Health | Payer: Self-pay | Source: Ambulatory Visit | Attending: Orthopedic Surgery

## 2016-08-14 DIAGNOSIS — Z881 Allergy status to other antibiotic agents status: Secondary | ICD-10-CM | POA: Diagnosis not present

## 2016-08-14 DIAGNOSIS — Z96659 Presence of unspecified artificial knee joint: Secondary | ICD-10-CM

## 2016-08-14 DIAGNOSIS — Z87891 Personal history of nicotine dependence: Secondary | ICD-10-CM

## 2016-08-14 DIAGNOSIS — D62 Acute posthemorrhagic anemia: Secondary | ICD-10-CM | POA: Diagnosis not present

## 2016-08-14 DIAGNOSIS — Z973 Presence of spectacles and contact lenses: Secondary | ICD-10-CM | POA: Diagnosis not present

## 2016-08-14 DIAGNOSIS — E785 Hyperlipidemia, unspecified: Secondary | ICD-10-CM | POA: Diagnosis not present

## 2016-08-14 DIAGNOSIS — H9190 Unspecified hearing loss, unspecified ear: Secondary | ICD-10-CM | POA: Diagnosis not present

## 2016-08-14 DIAGNOSIS — Z6835 Body mass index (BMI) 35.0-35.9, adult: Secondary | ICD-10-CM | POA: Diagnosis not present

## 2016-08-14 DIAGNOSIS — Z888 Allergy status to other drugs, medicaments and biological substances status: Secondary | ICD-10-CM | POA: Diagnosis not present

## 2016-08-14 DIAGNOSIS — M1711 Unilateral primary osteoarthritis, right knee: Principal | ICD-10-CM | POA: Diagnosis present

## 2016-08-14 DIAGNOSIS — K219 Gastro-esophageal reflux disease without esophagitis: Secondary | ICD-10-CM | POA: Diagnosis not present

## 2016-08-14 DIAGNOSIS — F329 Major depressive disorder, single episode, unspecified: Secondary | ICD-10-CM | POA: Diagnosis present

## 2016-08-14 DIAGNOSIS — E669 Obesity, unspecified: Secondary | ICD-10-CM | POA: Diagnosis not present

## 2016-08-14 DIAGNOSIS — G4730 Sleep apnea, unspecified: Secondary | ICD-10-CM | POA: Diagnosis present

## 2016-08-14 DIAGNOSIS — Z96652 Presence of left artificial knee joint: Secondary | ICD-10-CM | POA: Diagnosis not present

## 2016-08-14 DIAGNOSIS — R531 Weakness: Secondary | ICD-10-CM | POA: Diagnosis not present

## 2016-08-14 DIAGNOSIS — E782 Mixed hyperlipidemia: Secondary | ICD-10-CM | POA: Diagnosis present

## 2016-08-14 DIAGNOSIS — G8918 Other acute postprocedural pain: Secondary | ICD-10-CM | POA: Diagnosis not present

## 2016-08-14 DIAGNOSIS — Z96651 Presence of right artificial knee joint: Secondary | ICD-10-CM | POA: Diagnosis not present

## 2016-08-14 DIAGNOSIS — Z471 Aftercare following joint replacement surgery: Secondary | ICD-10-CM | POA: Diagnosis not present

## 2016-08-14 DIAGNOSIS — I10 Essential (primary) hypertension: Secondary | ICD-10-CM | POA: Diagnosis present

## 2016-08-14 HISTORY — PX: TOTAL KNEE ARTHROPLASTY: SHX125

## 2016-08-14 SURGERY — ARTHROPLASTY, KNEE, TOTAL
Anesthesia: Monitor Anesthesia Care | Site: Knee | Laterality: Right

## 2016-08-14 MED ORDER — PHENOL 1.4 % MT LIQD: 1.0000 | OROMUCOSAL | Status: DC | PRN

## 2016-08-14 MED ORDER — ACETAMINOPHEN 650 MG RE SUPP: 650.0000 mg | Freq: Four times a day (QID) | RECTAL | Status: DC | PRN

## 2016-08-14 MED ORDER — CHLORHEXIDINE GLUCONATE 4 % EX LIQD: 60.0000 mL | Freq: Once | CUTANEOUS | Status: DC

## 2016-08-14 MED ORDER — ASPIRIN EC 325 MG PO TBEC
325.0000 mg | DELAYED_RELEASE_TABLET | Freq: Every day | ORAL | Status: DC
  Administered 2016-08-15: 325 mg via ORAL
  Filled 2016-08-14: qty 1

## 2016-08-14 MED ORDER — MEPERIDINE HCL 25 MG/ML IJ SOLN: 6.2500 mg | INTRAMUSCULAR | Status: DC | PRN

## 2016-08-14 MED ORDER — POTASSIUM CHLORIDE IN NACL 20-0.9 MEQ/L-% IV SOLN
INTRAVENOUS | Status: DC
  Administered 2016-08-14 – 2016-08-15 (×2): via INTRAVENOUS
  Filled 2016-08-14 (×2): qty 1000

## 2016-08-14 MED ORDER — MAGNESIUM CITRATE PO SOLN: 1.0000 | Freq: Once | ORAL | Status: DC | PRN

## 2016-08-14 MED ORDER — SUCCINYLCHOLINE CHLORIDE 200 MG/10ML IV SOSY
PREFILLED_SYRINGE | INTRAVENOUS | Status: AC
  Filled 2016-08-14: qty 10

## 2016-08-14 MED ORDER — ROPIVACAINE HCL 7.5 MG/ML IJ SOLN
INTRAMUSCULAR | Status: DC | PRN
  Administered 2016-08-14: 20 mL via PERINEURAL

## 2016-08-14 MED ORDER — EPHEDRINE 5 MG/ML INJ
INTRAVENOUS | Status: AC
  Filled 2016-08-14: qty 10

## 2016-08-14 MED ORDER — CELECOXIB 200 MG PO CAPS
200.0000 mg | ORAL_CAPSULE | Freq: Two times a day (BID) | ORAL | Status: DC
  Administered 2016-08-14 – 2016-08-15 (×3): 200 mg via ORAL
  Filled 2016-08-14 (×3): qty 1

## 2016-08-14 MED ORDER — DEXAMETHASONE SODIUM PHOSPHATE 10 MG/ML IJ SOLN
10.0000 mg | Freq: Once | INTRAMUSCULAR | Status: AC
  Administered 2016-08-15: 10 mg via INTRAVENOUS
  Filled 2016-08-14: qty 1

## 2016-08-14 MED ORDER — ONDANSETRON HCL 4 MG PO TABS: 4.0000 mg | ORAL_TABLET | Freq: Three times a day (TID) | ORAL | 0 refills | Status: DC | PRN

## 2016-08-14 MED ORDER — BUPIVACAINE IN DEXTROSE 0.75-8.25 % IT SOLN
INTRATHECAL | Status: DC | PRN
  Administered 2016-08-14: 1.6 mL via INTRATHECAL

## 2016-08-14 MED ORDER — PROMETHAZINE HCL 25 MG/ML IJ SOLN: 6.2500 mg | INTRAMUSCULAR | Status: DC | PRN

## 2016-08-14 MED ORDER — HYDROMORPHONE HCL 2 MG PO TABS
2.0000 mg | ORAL_TABLET | ORAL | Status: DC | PRN
  Administered 2016-08-14 – 2016-08-15 (×6): 4 mg via ORAL
  Filled 2016-08-14 (×6): qty 2

## 2016-08-14 MED ORDER — PROPOFOL 10 MG/ML IV BOLUS
INTRAVENOUS | Status: DC | PRN
  Administered 2016-08-14: 20 mg via INTRAVENOUS

## 2016-08-14 MED ORDER — PHENYLEPHRINE 40 MCG/ML (10ML) SYRINGE FOR IV PUSH (FOR BLOOD PRESSURE SUPPORT)
PREFILLED_SYRINGE | INTRAVENOUS | Status: AC
  Filled 2016-08-14: qty 10

## 2016-08-14 MED ORDER — KETOROLAC TROMETHAMINE 30 MG/ML IJ SOLN
30.0000 mg | Freq: Once | INTRAMUSCULAR | Status: AC
  Administered 2016-08-14: 30 mg via INTRAVENOUS

## 2016-08-14 MED ORDER — HYDROMORPHONE HCL 2 MG PO TABS: ORAL_TABLET | ORAL | 0 refills | Status: DC

## 2016-08-14 MED ORDER — BISACODYL 10 MG RE SUPP
10.0000 mg | Freq: Every day | RECTAL | Status: DC | PRN
Start: 2016-08-14 — End: 2016-08-15

## 2016-08-14 MED ORDER — BUPIVACAINE HCL (PF) 0.5 % IJ SOLN
INTRAMUSCULAR | Status: AC
  Filled 2016-08-14: qty 10

## 2016-08-14 MED ORDER — DIAZEPAM 2 MG PO TABS: 2.0000 mg | ORAL_TABLET | Freq: Three times a day (TID) | ORAL | Status: DC | PRN

## 2016-08-14 MED ORDER — EZETIMIBE 10 MG PO TABS
10.0000 mg | ORAL_TABLET | Freq: Every day | ORAL | Status: DC
  Administered 2016-08-14: 10 mg via ORAL
  Filled 2016-08-14: qty 1

## 2016-08-14 MED ORDER — METOCLOPRAMIDE HCL 5 MG/ML IJ SOLN: 5.0000 mg | Freq: Three times a day (TID) | INTRAMUSCULAR | Status: DC | PRN

## 2016-08-14 MED ORDER — PROPOFOL 500 MG/50ML IV EMUL
INTRAVENOUS | Status: DC | PRN
  Administered 2016-08-14: 25 ug/kg/min via INTRAVENOUS

## 2016-08-14 MED ORDER — ACETAMINOPHEN 160 MG/5ML PO SOLN: 650.0000 mg | Freq: Four times a day (QID) | ORAL | Status: DC | PRN

## 2016-08-14 MED ORDER — ONDANSETRON HCL 4 MG PO TABS: 4.0000 mg | ORAL_TABLET | Freq: Four times a day (QID) | ORAL | Status: DC | PRN

## 2016-08-14 MED ORDER — ALUM & MAG HYDROXIDE-SIMETH 200-200-20 MG/5ML PO SUSP: 30.0000 mL | ORAL | Status: DC | PRN

## 2016-08-14 MED ORDER — SODIUM CHLORIDE 0.9 % IR SOLN
Status: DC | PRN
  Administered 2016-08-14: 3000 mL

## 2016-08-14 MED ORDER — BENAZEPRIL HCL 20 MG PO TABS
20.0000 mg | ORAL_TABLET | Freq: Every day | ORAL | Status: DC
  Administered 2016-08-14: 20 mg via ORAL
  Filled 2016-08-14: qty 1

## 2016-08-14 MED ORDER — MIDAZOLAM HCL 2 MG/2ML IJ SOLN
INTRAMUSCULAR | Status: AC
  Administered 2016-08-14: 2 mg via INTRAVENOUS
  Filled 2016-08-14: qty 2

## 2016-08-14 MED ORDER — VANCOMYCIN HCL IN DEXTROSE 1-5 GM/200ML-% IV SOLN
1000.0000 mg | Freq: Two times a day (BID) | INTRAVENOUS | Status: AC
  Administered 2016-08-14: 1000 mg via INTRAVENOUS
  Filled 2016-08-14: qty 200

## 2016-08-14 MED ORDER — SODIUM CHLORIDE 0.9 % IJ SOLN
INTRAMUSCULAR | Status: DC | PRN
  Administered 2016-08-14: 40 mL

## 2016-08-14 MED ORDER — ASPIRIN EC 325 MG PO TBEC: 325.0000 mg | DELAYED_RELEASE_TABLET | Freq: Every day | ORAL | 0 refills | Status: DC

## 2016-08-14 MED ORDER — HYDROMORPHONE HCL 2 MG/ML IJ SOLN
0.5000 mg | INTRAMUSCULAR | Status: DC | PRN
  Administered 2016-08-14 – 2016-08-15 (×3): 1 mg via INTRAVENOUS
  Filled 2016-08-14 (×3): qty 1

## 2016-08-14 MED ORDER — BUPIVACAINE HCL (PF) 0.5 % IJ SOLN
INTRAMUSCULAR | Status: AC
  Filled 2016-08-14: qty 30

## 2016-08-14 MED ORDER — ZOLPIDEM TARTRATE 5 MG PO TABS: 5.0000 mg | ORAL_TABLET | Freq: Every evening | ORAL | Status: DC | PRN

## 2016-08-14 MED ORDER — BUPIVACAINE HCL 0.5 % IJ SOLN
INTRAMUSCULAR | Status: DC | PRN
  Administered 2016-08-14: 10 mL

## 2016-08-14 MED ORDER — ONDANSETRON HCL 4 MG/2ML IJ SOLN
INTRAMUSCULAR | Status: AC
  Filled 2016-08-14: qty 2

## 2016-08-14 MED ORDER — DOCUSATE SODIUM 100 MG PO CAPS
100.0000 mg | ORAL_CAPSULE | Freq: Two times a day (BID) | ORAL | Status: DC
  Administered 2016-08-14 – 2016-08-15 (×3): 100 mg via ORAL
  Filled 2016-08-14 (×3): qty 1

## 2016-08-14 MED ORDER — MENTHOL 3 MG MT LOZG: 1.0000 | LOZENGE | OROMUCOSAL | Status: DC | PRN

## 2016-08-14 MED ORDER — POLYETHYLENE GLYCOL 3350 17 G PO PACK: 17.0000 g | PACK | Freq: Every day | ORAL | Status: DC | PRN

## 2016-08-14 MED ORDER — DIPHENHYDRAMINE HCL 12.5 MG/5ML PO ELIX: 12.5000 mg | ORAL_SOLUTION | ORAL | Status: DC | PRN

## 2016-08-14 MED ORDER — HYDROMORPHONE HCL 1 MG/ML IJ SOLN: 0.2500 mg | INTRAMUSCULAR | Status: DC | PRN

## 2016-08-14 MED ORDER — PHENYLEPHRINE HCL 10 MG/ML IJ SOLN
INTRAMUSCULAR | Status: DC | PRN
  Administered 2016-08-14: 80 ug via INTRAVENOUS

## 2016-08-14 MED ORDER — MIDAZOLAM HCL 2 MG/2ML IJ SOLN
INTRAMUSCULAR | Status: AC
  Filled 2016-08-14: qty 2

## 2016-08-14 MED ORDER — MIDAZOLAM HCL 2 MG/2ML IJ SOLN
2.0000 mg | Freq: Once | INTRAMUSCULAR | Status: AC
  Administered 2016-08-14: 2 mg via INTRAVENOUS

## 2016-08-14 MED ORDER — PHENYLEPHRINE HCL 10 MG/ML IJ SOLN
INTRAVENOUS | Status: DC | PRN
  Administered 2016-08-14: 25 ug/min via INTRAVENOUS

## 2016-08-14 MED ORDER — KETOROLAC TROMETHAMINE 30 MG/ML IJ SOLN
INTRAMUSCULAR | Status: AC
  Filled 2016-08-14: qty 1

## 2016-08-14 MED ORDER — FENTANYL CITRATE (PF) 100 MCG/2ML IJ SOLN
INTRAMUSCULAR | Status: AC
  Administered 2016-08-14: 100 ug via INTRAVENOUS
  Filled 2016-08-14: qty 2

## 2016-08-14 MED ORDER — BUPIVACAINE LIPOSOME 1.3 % IJ SUSP
20.0000 mL | INTRAMUSCULAR | Status: AC
  Administered 2016-08-14: 20 mL
  Filled 2016-08-14: qty 20

## 2016-08-14 MED ORDER — FENTANYL CITRATE (PF) 100 MCG/2ML IJ SOLN
100.0000 ug | Freq: Once | INTRAMUSCULAR | Status: AC
  Administered 2016-08-14: 100 ug via INTRAVENOUS

## 2016-08-14 MED ORDER — METOCLOPRAMIDE HCL 5 MG PO TABS: 5.0000 mg | ORAL_TABLET | Freq: Three times a day (TID) | ORAL | Status: DC | PRN

## 2016-08-14 MED ORDER — ONDANSETRON HCL 4 MG/2ML IJ SOLN: 4.0000 mg | Freq: Four times a day (QID) | INTRAMUSCULAR | Status: DC | PRN

## 2016-08-14 MED ORDER — FENTANYL CITRATE (PF) 100 MCG/2ML IJ SOLN
INTRAMUSCULAR | Status: AC
  Filled 2016-08-14: qty 2

## 2016-08-14 MED ORDER — BENAZEPRIL HCL 20 MG PO TABS: 20.0000 mg | ORAL_TABLET | Freq: Every day | ORAL | Status: DC

## 2016-08-14 MED ORDER — LIDOCAINE 2% (20 MG/ML) 5 ML SYRINGE
INTRAMUSCULAR | Status: AC
  Filled 2016-08-14: qty 5

## 2016-08-14 MED ORDER — ACETAMINOPHEN 325 MG PO TABS: 650.0000 mg | ORAL_TABLET | Freq: Four times a day (QID) | ORAL | Status: DC | PRN

## 2016-08-14 SURGICAL SUPPLY — 66 items
APL SKNCLS STERI-STRIP NONHPOA (GAUZE/BANDAGES/DRESSINGS) ×1
BANDAGE ACE 4X5 VEL STRL LF (GAUZE/BANDAGES/DRESSINGS) ×3 IMPLANT
BANDAGE ACE 6X5 VEL STRL LF (GAUZE/BANDAGES/DRESSINGS) ×3 IMPLANT
BANDAGE ESMARK 6X9 LF (GAUZE/BANDAGES/DRESSINGS) ×1 IMPLANT
BENZOIN TINCTURE PRP APPL 2/3 (GAUZE/BANDAGES/DRESSINGS) ×3 IMPLANT
BLADE SAG 18X100X1.27 (BLADE) ×6 IMPLANT
BNDG CMPR 9X6 STRL LF SNTH (GAUZE/BANDAGES/DRESSINGS) ×1
BNDG ESMARK 6X9 LF (GAUZE/BANDAGES/DRESSINGS) ×3
BOWL SMART MIX CTS (DISPOSABLE) ×3 IMPLANT
CAPT KNEE TOTAL 3 ×2 IMPLANT
CEMENT BONE SIMPLEX SPEEDSET (Cement) ×6 IMPLANT
CLOSURE WOUND 1/2 X4 (GAUZE/BANDAGES/DRESSINGS) ×2
COVER SURGICAL LIGHT HANDLE (MISCELLANEOUS) ×3 IMPLANT
CUFF TOURNIQUET SINGLE 34IN LL (TOURNIQUET CUFF) ×3 IMPLANT
DRAPE HALF SHEET 40X57 (DRAPES) ×1 IMPLANT
DRAPE IMP U-DRAPE 54X76 (DRAPES) ×1 IMPLANT
DRAPE PROXIMA HALF (DRAPES) IMPLANT
DRAPE U-SHAPE 47X51 STRL (DRAPES) ×3 IMPLANT
DRSG AQUACEL AG ADV 3.5X10 (GAUZE/BANDAGES/DRESSINGS) ×3 IMPLANT
DURAPREP 26ML APPLICATOR (WOUND CARE) ×3 IMPLANT
ELECT CAUTERY BLADE 6.4 (BLADE) IMPLANT
ELECT REM PT RETURN 9FT ADLT (ELECTROSURGICAL) ×3
ELECTRODE REM PT RTRN 9FT ADLT (ELECTROSURGICAL) ×1 IMPLANT
EVACUATOR 1/8 PVC DRAIN (DRAIN) IMPLANT
FACESHIELD WRAPAROUND (MASK) ×6 IMPLANT
FACESHIELD WRAPAROUND OR TEAM (MASK) ×2 IMPLANT
GLOVE BIOGEL PI IND STRL 7.0 (GLOVE) ×1 IMPLANT
GLOVE BIOGEL PI INDICATOR 7.0 (GLOVE) ×4
GLOVE ORTHO TXT STRL SZ7.5 (GLOVE) ×3 IMPLANT
GLOVE SURG ORTHO 7.0 STRL STRW (GLOVE) ×3 IMPLANT
GOWN STRL REUS W/ TWL LRG LVL3 (GOWN DISPOSABLE) ×3 IMPLANT
GOWN STRL REUS W/ TWL XL LVL3 (GOWN DISPOSABLE) ×1 IMPLANT
GOWN STRL REUS W/TWL LRG LVL3 (GOWN DISPOSABLE) ×9
GOWN STRL REUS W/TWL XL LVL3 (GOWN DISPOSABLE) ×3
HANDPIECE INTERPULSE COAX TIP (DISPOSABLE) ×3
IMMOBILIZER KNEE 22 UNIV (SOFTGOODS) ×3 IMPLANT
IMMOBILIZER KNEE 24 THIGH 36 (MISCELLANEOUS) IMPLANT
IMMOBILIZER KNEE 24 UNIV (MISCELLANEOUS)
KIT BASIN OR (CUSTOM PROCEDURE TRAY) ×3 IMPLANT
KIT ROOM TURNOVER OR (KITS) ×3 IMPLANT
MANIFOLD NEPTUNE II (INSTRUMENTS) ×3 IMPLANT
NDL 18GX1X1/2 (RX/OR ONLY) (NEEDLE) ×1 IMPLANT
NDL HYPO 25GX1X1/2 BEV (NEEDLE) ×1 IMPLANT
NEEDLE 18GX1X1/2 (RX/OR ONLY) (NEEDLE) ×3 IMPLANT
NEEDLE HYPO 25GX1X1/2 BEV (NEEDLE) ×3 IMPLANT
NS IRRIG 1000ML POUR BTL (IV SOLUTION) ×3 IMPLANT
PACK TOTAL JOINT (CUSTOM PROCEDURE TRAY) ×3 IMPLANT
PACK UNIVERSAL I (CUSTOM PROCEDURE TRAY) IMPLANT
PAD ARMBOARD 7.5X6 YLW CONV (MISCELLANEOUS) ×6 IMPLANT
SET HNDPC FAN SPRY TIP SCT (DISPOSABLE) ×1 IMPLANT
STRIP CLOSURE SKIN 1/2X4 (GAUZE/BANDAGES/DRESSINGS) ×4 IMPLANT
SUCTION FRAZIER HANDLE 10FR (MISCELLANEOUS) ×2
SUCTION TUBE FRAZIER 10FR DISP (MISCELLANEOUS) ×1 IMPLANT
SUT MNCRL AB 4-0 PS2 18 (SUTURE) ×3 IMPLANT
SUT VIC AB 0 CT1 27 (SUTURE) ×3
SUT VIC AB 0 CT1 27XBRD ANBCTR (SUTURE) IMPLANT
SUT VIC AB 1 CTX 36 (SUTURE) ×3
SUT VIC AB 1 CTX36XBRD ANBCTR (SUTURE) ×1 IMPLANT
SUT VIC AB 2-0 CT1 27 (SUTURE) ×6
SUT VIC AB 2-0 CT1 TAPERPNT 27 (SUTURE) ×2 IMPLANT
SYR 50ML LL SCALE MARK (SYRINGE) ×3 IMPLANT
SYR CONTROL 10ML LL (SYRINGE) ×3 IMPLANT
TOWEL OR 17X24 6PK STRL BLUE (TOWEL DISPOSABLE) ×3 IMPLANT
TOWEL OR 17X26 10 PK STRL BLUE (TOWEL DISPOSABLE) ×3 IMPLANT
TRAY CATH 16FR W/PLASTIC CATH (SET/KITS/TRAYS/PACK) ×2 IMPLANT
WATER STERILE IRR 1000ML POUR (IV SOLUTION) ×1 IMPLANT

## 2016-08-14 NOTE — Transfer of Care (Signed)
Immediate Anesthesia Transfer of Care Note  Patient: [name]  Procedure(s) Performed: Right Total Knee Arthroplasty  Patient Location: PACU   Anesthesia Type:Spinal with MAC  Level of Consciousness: Awake  Airway & Oxygen Therapy: Natural/ room air  Post-op Assessment: Stable  Post vital signs: VSSAF  Last Vitals:  Vitals:   08/14/16 1145 08/14/16 1200  BP: 107/69   Pulse: 70 70  Resp: 16 14  Temp:      Last Pain:  Vitals:   08/14/16 0805  TempSrc: Oral         Complications: None

## 2016-08-14 NOTE — Anesthesia Procedure Notes (Signed)
Spinal  Patient location during procedure: OR Start time: 08/14/2016 9:50 AM End time: 08/14/2016 9:54 AM Staffing Anesthesiologist: Lyn Hollingshead Performed: anesthesiologist  Preanesthetic Checklist Completed: patient identified, surgical consent, pre-op evaluation, timeout performed, IV checked, risks and benefits discussed and monitors and equipment checked Spinal Block Patient position: sitting Prep: site prepped and draped and DuraPrep Patient monitoring: heart rate, cardiac monitor, continuous pulse ox and blood pressure Approach: midline Location: L3-4 Injection technique: single-shot Needle Needle type: Pencan  Needle gauge: 24 G Needle length: 9 cm Needle insertion depth: 7 cm Assessment Sensory level: T8

## 2016-08-14 NOTE — Anesthesia Preprocedure Evaluation (Addendum)
Anesthesia Evaluation  Patient identified by MRN, date of birth, ID band Patient awake    Reviewed: Allergy & Precautions, H&P , NPO status , Patient's Chart, lab work & pertinent test results  Airway Mallampati: II   Neck ROM: full    Dental no notable dental hx. (+) Teeth Intact, Dental Advidsory Given   Pulmonary shortness of breath, sleep apnea , former smoker,    Pulmonary exam normal        Cardiovascular hypertension, Pt. on medications Normal cardiovascular exam     Neuro/Psych  Headaches, PSYCHIATRIC DISORDERS Depression    GI/Hepatic Neg liver ROS, GERD  ,  Endo/Other  negative endocrine ROSobese  Renal/GU negative Renal ROS  negative genitourinary   Musculoskeletal negative musculoskeletal ROS (+) Arthritis ,   Abdominal (+) + obese,   Peds negative pediatric ROS (+)  Hematology negative hematology ROS (+)   Anesthesia Other Findings   Reproductive/Obstetrics negative OB ROS                             Anesthesia Physical  Anesthesia Plan  ASA: II  Anesthesia Plan: MAC and Spinal   Post-op Pain Management:    Induction:   Airway Management Planned: Simple Face Mask  Additional Equipment:   Intra-op Plan:   Post-operative Plan:   Informed Consent: I have reviewed the patients History and Physical, chart, labs and discussed the procedure including the risks, benefits and alternatives for the proposed anesthesia with the patient or authorized representative who has indicated his/her understanding and acceptance.   Dental Advisory Given  Plan Discussed with: CRNA and Surgeon  Anesthesia Plan Comments:         Anesthesia Quick Evaluation

## 2016-08-14 NOTE — Anesthesia Procedure Notes (Signed)
Procedure Name: MAC Date/Time: 08/14/2016 9:50 AM Performed by: Trixie Deis A Oxygen Delivery Method: Simple face mask Placement Confirmation: positive ETCO2

## 2016-08-14 NOTE — Progress Notes (Signed)
Orthopedic Tech Progress Note Patient Details:  [name] [birth year] [id number]  CPM Right Knee CPM Right Knee: On Right Knee Flexion (Degrees): 90 Right Knee Extension (Degrees): 0   Debra Colon 08/14/2016, 12:24 PM Trapeze bar patient helper not applied because pt exceeds weight limit; RN notified

## 2016-08-14 NOTE — Discharge Instructions (Signed)

## 2016-08-14 NOTE — Anesthesia Procedure Notes (Addendum)
Anesthesia Regional Block: Adductor canal block   Pre-Anesthetic Checklist: ,, timeout performed, Correct Patient, Correct Site, Correct Laterality, Correct Procedure, Correct Position, site marked, Risks and benefits discussed,  Surgical consent,  Pre-op evaluation,  At surgeon's request and post-op pain management  Laterality: Right  Prep: chloraprep       Needles:  Injection technique: Single-shot  Needle Type: Echogenic Stimulator Needle     Needle Length: 9cm  Needle Gauge: 21   Needle insertion depth: 6 cm   Additional Needles:   Procedures: ultrasound guided,,,,,,,,  Narrative:  Start time: 08/14/2016 9:10 AM End time: 08/14/2016 9:22 AM Injection made incrementally with aspirations every 5 mL.  Performed by: Personally  Anesthesiologist: Lyn Hollingshead

## 2016-08-14 NOTE — Anesthesia Postprocedure Evaluation (Addendum)
Anesthesia Post Note  Patient: [name]  Procedure(s) Performed: Procedure(s) (LRB): RIGHT TOTAL KNEE ARTHROPLASTY (Right)  Patient location during evaluation: PACU Anesthesia Type: MAC Pain management: pain level controlled Vital Signs Assessment: post-procedure vital signs reviewed and stable Respiratory status: spontaneous breathing Postop Assessment: no headache, no backache, spinal receding, patient able to bend at knees and no signs of nausea or vomiting Anesthetic complications: no       Last Vitals:  Vitals:   08/14/16 1145 08/14/16 1200  BP: 107/69   Pulse: 70 70  Resp: 16 14  Temp:      Last Pain:  Vitals:   08/14/16 0805  TempSrc: Oral                 Shandon Burlingame JR,JOHN Mateo Flow

## 2016-08-14 NOTE — Discharge Summary (Signed)
Patient ID: SINCLAIR ALLIGOOD MRN: [id number] DOB/AGE: 73-Jan-1947 73 y.o.  Admit date: 08/14/2016 Discharge date: 08/15/2016  Admission Diagnoses:  Principal Problem:   Primary localized osteoarthritis of right knee Active Problems:   Hyperlipidemia   Essential hypertension   Sleep apnea   Discharge Diagnoses:  Same  Past Medical History:  Diagnosis Date  . Asthma    as a child  . Colon polyps   . Depression   . Dyspnea   . Epigastric pain   . Hard of hearing   . Headache    migraines  . History of bronchitis   . History of pneumonia   . Hyperlipidemia   . Hypertension   . Knee pain   . Nausea    Occasional  . Osteoarthritis   . Sigmoid diverticulosis   . Wears glasses     Surgeries: Procedure(s): RIGHT TOTAL KNEE ARTHROPLASTY on 08/14/2016   Consultants:   Discharged Condition: Improved  Hospital Course: BODHI MORADI is an 71 y.o. male who was admitted 08/14/2016 for operative treatment ofPrimary localized osteoarthritis of right knee. Patient has severe unremitting pain that affects sleep, daily activities, and work/hobbies. After pre-op clearance the patient was taken to the operating room on 08/14/2016 and underwent  Procedure(s): RIGHT TOTAL KNEE ARTHROPLASTY.    Patient was given perioperative antibiotics:  Anti-infectives    Start     Dose/Rate Route Frequency Ordered Stop   08/14/16 2000  vancomycin (VANCOCIN) IVPB 1000 mg/200 mL premix     1,000 mg 200 mL/hr over 60 Minutes Intravenous Every 12 hours 08/14/16 1334 08/14/16 2208   08/14/16 0800  vancomycin (VANCOCIN) 1,500 mg in sodium chloride 0.9 % 500 mL IVPB     1,500 mg 250 mL/hr over 120 Minutes Intravenous To [facility] 08/13/16 1327 08/14/16 1045       Patient was given sequential compression devices, early ambulation, and chemoprophylaxis to prevent DVT.  Patient benefited maximally from hospital stay and there were no complications.    Recent vital signs:  Patient Vitals for the past  24 hrs:  BP Temp Temp src Pulse Resp SpO2 Weight  08/15/16 0442 112/61 99 F (37.2 C) Oral 82 18 98 % -  08/14/16 2143 119/73 99.3 F (37.4 C) Oral 84 18 98 % -  08/14/16 1340 (!) 146/82 97.6 F (36.4 C) Oral 64 18 96 % -  08/14/16 1245 138/75 97.9 F (36.6 C) - (!) 59 19 100 % -  08/14/16 1230 (!) 144/102 - - 62 17 97 % -  08/14/16 1215 135/86 - - 64 19 99 % -  08/14/16 1200 (!) 157/137 - - 70 14 99 % -  08/14/16 1145 107/69 - - 70 16 96 % -  08/14/16 1130 115/75 97.5 F (36.4 C) - 76 (!) 23 96 % -  08/14/16 0920 118/62 - - 73 11 95 % -  08/14/16 0910 (!) 147/94 - - 72 14 97 % -  08/14/16 0905 133/81 - - 71 13 97 % -  08/14/16 0900 120/78 - - 74 10 98 % -  08/14/16 0855 132/81 - - 74 13 97 % -  08/14/16 0805 (!) 152/86 98.1 F (36.7 C) Oral 77 20 95 % 111.1 kg (245 lb)     Recent laboratory studies:   Recent Labs  08/15/16 0455  WBC 9.3  HGB 11.7*  HCT 36.0*  PLT 230  NA 133*  K 4.1  CL 101  CO2 26  BUN 12  CREATININE 1.09  GLUCOSE 127*  CALCIUM 8.1*     Discharge Medications:   Allergies as of 08/15/2016      Reactions   Amoxicillin Hives, Rash   Has patient had a PCN reaction causing immediate rash, facial/tongue/throat swelling, [name]: #  #  #  YES  #  #  #  Has patient had a PCN reaction causing SEVERE RASH INVOLVING MUCUS MEMBRANES or SKIN NECROSIS: #  #  #  YES  #  #  #  Has patient had a PCN reaction that required hospitalization No Has patient had a PCN reaction occurring within the last 10 years: #  #  #  YES  #  #  #  If all of the above answers are "NO", then may proceed with Cephalosporin use.   Atorvastatin Other (See Comments)   Weakness in muscles   Statins Other (See Comments)   Weakness in muscles   Codeine Other (See Comments)   *  *  Pt does not have allergy to codeine but father had severe reaction to codeine so he never wants to be given to him  *  *   Toprol Xl [metoprolol Succinate] Other (See  Comments)   Pt states is " like a govenor on a bus"       Medication List    STOP taking these medications   apixaban 2.5 MG Tabs tablet Commonly known as:  ELIQUIS   naproxen 500 MG tablet Commonly known as:  NAPROSYN     TAKE these medications   aspirin EC 325 MG tablet Take 1 tablet (325 mg total) by mouth daily. 1 tab a day for the next 30 days to prevent blood clots   B-2 PO Take 1 tablet by mouth daily.   B-6 PO Take 1 tablet by mouth daily.   benazepril 20 MG tablet Commonly known as:  LOTENSIN Take 1 tablet (20 mg total) by mouth daily. What changed:  when to take this   CINNAMON PO Take 1 capsule by mouth 2 (two) times daily.   ezetimibe 10 MG tablet Commonly known as:  ZETIA Take 1 tablet (10 mg total) by mouth daily. What changed:  when to take this   fenofibrate 160 MG tablet Take 1 tablet (160 mg total) by mouth daily.   Fenofibric Acid 135 MG Cpdr Take 135 mg by mouth daily.   folic acid 269 MCG tablet Commonly known as:  FOLVITE Take 400 mcg by mouth daily.   GARLIC PO Take 1 tablet by mouth 2 (two) times daily.   HYDROmorphone 2 MG tablet Commonly known as:  DILAUDID Take 1-2 tabs po q4-6 hours prn pain What changed:  additional instructions   multivitamin per tablet Take 1 tablet by mouth daily.   ondansetron 4 MG tablet Commonly known as:  ZOFRAN Take 1 tablet (4 mg total) by mouth every 8 (eight) hours as needed for nausea or vomiting. What changed:  Another medication with the same name was added. Make sure you understand how and when to take each.   ondansetron 4 MG tablet Commonly known as:  ZOFRAN Take 1 tablet (4 mg total) by mouth every 8 (eight) hours as needed for nausea or vomiting. What changed:  You were already taking a medication with the same name, and this prescription was added. Make sure you understand how and when to take each.   oxymetazoline 0.05 % nasal spray Commonly known as:  [employer]  1-2 sprays into  both nostrils 2 (two) times daily as needed for congestion.   REFRESH OP Place 1-2 drops into both eyes 3 (three) times daily.       Diagnostic Studies: Dg Knee Right Port  Result Date: 08/14/2016 CLINICAL DATA:  Total right knee of placement EXAM: PORTABLE RIGHT KNEE - 1-2 VIEW COMPARISON:  None. FINDINGS: Right knee arthroplasty in satisfactory position. No fracture or dislocation is seen. Mild degenerative changes the patellofemoral joint. Associated subcutaneous gas. IMPRESSION: Right knee arthroplasty in satisfactory position. Electronically Signed   By: Julian Hy M.D.   On: 08/14/2016 12:03    Disposition: 06-Home-Health Care Svc    Follow-up Information    Ninetta Lights, MD. Schedule an appointment as soon as possible for a visit in 2 week(s).   Specialty:  Orthopedic Surgery Contact information: [address] [place] [address] [phone]            Signed: Fannie Knee 08/15/2016, 7:09 AM

## 2016-08-14 NOTE — Interval H&P Note (Signed)
History and Physical Interval Note:  08/14/2016 8:28 AM  [name]  has presented today for surgery, with the diagnosis of djd right knee  The various methods of treatment have been discussed with the patient and family. After consideration of risks, benefits and other options for treatment, the patient has consented to  Procedure(s): RIGHT TOTAL KNEE ARTHROPLASTY (Right) as a surgical intervention .  The patient's history has been reviewed, patient examined, no change in status, stable for surgery.  I have reviewed the patient's chart and labs.  Questions were answered to the patient's satisfaction.     Ninetta Lights

## 2016-08-15 ENCOUNTER — Encounter (HOSPITAL_COMMUNITY): Payer: Self-pay | Admitting: Orthopedic Surgery

## 2016-08-15 DIAGNOSIS — M1711 Unilateral primary osteoarthritis, right knee: Secondary | ICD-10-CM | POA: Diagnosis not present

## 2016-08-15 LAB — CBC
HCT: 36 % — ABNORMAL LOW (ref 39.0–52.0)
HEMOGLOBIN: 11.7 g/dL — AB (ref 13.0–17.0)
MCH: 28.3 pg (ref 26.0–34.0)
MCHC: 32.5 g/dL (ref 30.0–36.0)
MCV: 87 fL (ref 78.0–100.0)
PLATELETS: 230 10*3/uL (ref 150–400)
RBC: 4.14 MIL/uL — ABNORMAL LOW (ref 4.22–5.81)
RDW: 14 % (ref 11.5–15.5)
WBC: 9.3 10*3/uL (ref 4.0–10.5)

## 2016-08-15 LAB — BASIC METABOLIC PANEL
Anion gap: 6 (ref 5–15)
BUN: 12 mg/dL (ref 6–20)
CALCIUM: 8.1 mg/dL — AB (ref 8.9–10.3)
CO2: 26 mmol/L (ref 22–32)
CREATININE: 1.09 mg/dL (ref 0.61–1.24)
Chloride: 101 mmol/L (ref 101–111)
Glucose, Bld: 127 mg/dL — ABNORMAL HIGH (ref 65–99)
Potassium: 4.1 mmol/L (ref 3.5–5.1)
SODIUM: 133 mmol/L — AB (ref 135–145)

## 2016-08-15 NOTE — Op Note (Signed)
NAMEPRESLEY, SUMMERLIN NO.:  [id number]  MEDICAL RECORD NO.:  [id number]  LOCATION:                                 FACILITY:  PHYSICIAN:  Ninetta Lights, M.D. DATE OF BIRTH:  [birth year]  DATE OF PROCEDURE:  08/14/2016 DATE OF DISCHARGE:                              OPERATIVE REPORT   PREOPERATIVE DIAGNOSIS:  Right knee end-stage arthritis, primary, generalized.  POSTOPERATIVE DIAGNOSIS:  Right knee end-stage arthritis, primary, generalized.  PROCEDURE:  Modified minimally invasive right total knee replacement, Stryker triathlon prosthesis.  A posterior stabilized #5 femoral component.  A #6 tibial component, 9-mm PS insert.  Resurfacing 38-mm patellar component.  SURGEON:  Ninetta Lights, M.D.  ASSISTANT:  Elmyra Ricks, PA, present throughout the entire case and necessary for timely completion of procedure.  ANESTHESIA:  Spinal.  BLOOD LOSS:  Minimal.  SPECIMENS:  None.  CULTURES:  None.  COMPLICATION:  None.  DRESSINGS:  Soft compressive knee immobilizer.  TOURNIQUET TIME:  1 hour.  DESCRIPTION OF PROCEDURE:  The patient was brought to operating room and after adequate anesthesia had been obtained, tourniquet applied, prepped and draped in usual sterile fashion.  Exsanguinated with elevation of Esmarch.  Tourniquet inflated to 350 mmHg.  Straight incision above the patella down to tibial tubercle.  Medial arthrotomy, vastus splitting. Flexible intramedullary guide distal femur, 8 mm resection, 5 degrees of valgus.  Using epicondylar axis, the femur was sized, cut, and fitted for posterior-stabilized pegged #5 component.  Proximal tibial resection with extramedullary guide.  Size #6 component, rotation set with trials. Patella exposed.  Posterior 10 mm removed, drilled, sized, and fitted for a 38-mm component.  After appropriate trials, the knee was thoroughly irrigated.  Cement prepared and all components firmly seated. Polyethylene  attached to tibia, knee reduced.  Patella held with a clamp.  Once the cement hardened, the knee was examined again.  Full motion.  Great stability.  Good balance.  Good patellofemoral tracking.  Soft tissue was injected with Exparel.  Arthrotomy closed with #1 Vicryl with the subcutaneous subcuticular closure.  Margins were injected with Marcaine.  Sterile compressive dressing applied.  Anesthesia reversed.  Brought to the recovery room.  Tolerated the surgery well.  No complications.     Ninetta Lights, M.D.   ______________________________ Ninetta Lights, M.D.    DFM/MEDQ  D:  08/15/2016  T:  08/15/2016  Job:  [id number]

## 2016-08-15 NOTE — Progress Notes (Signed)
OT Cancellation Note  Patient Details Name: [name] MRN: [id number] DOB: 1946/01/09   Cancelled Treatment:    Reason Eval/Treat Not Completed: OT screened, no needs identified, will sign off. Per PT following PT eval, pt does not need OT eval.  Clover Mealy OTR/L Pager: [phone]  08/15/2016, 10:21 AM

## 2016-08-15 NOTE — Evaluation (Signed)
Physical Therapy Evaluation Patient Details Name: [name] MRN: [id number] DOB: 12-16-1945 Today's Date: 08/15/2016   History of Present Illness  Pt is a 71 yo male admitted on 08/14/16 for right TKA. PMH significant for l TKA 11/17, dyspnea, depression, asthma, GERD.   Clinical Impression  Pt is POD 1 and moving well with therapy. Prior to admission, pt was completely independent and mobilizing occasionally with RW for stability. Pt is able to perform all mobility this session with Min guard to supervision and is bordering on Mod I. Pt was able to perform gait x 500', stair negotiation and reviewed exercises in HEP with all questions answered and no difficulty noted. Pt would like to defer his second therapy session and is ready to discharge home as cleared by Md.     Follow Up Recommendations Outpatient PT    Equipment Recommendations  None recommended by PT    Recommendations for Other Services       Precautions / Restrictions Precautions Precautions: Knee Precaution Booklet Issued: Yes (comment) Precaution Comments: Handout given and reviewed. Pt aware of all precautions from previous surgery Required Braces or Orthoses: Knee Immobilizer - Right Knee Immobilizer - Right: Other (comment) (No order) Restrictions Weight Bearing Restrictions: Yes RLE Weight Bearing: Weight bearing as tolerated      Mobility  Bed Mobility               General bed mobility comments: sitting EOB when PT arrives  Transfers Overall transfer level: Needs assistance Equipment used: Rolling walker (2 wheeled) Transfers: Sit to/from Stand Sit to Stand: Min guard         General transfer comment: Min guard for safety from EOB to RW  Ambulation/Gait Ambulation/Gait assistance: Supervision Ambulation Distance (Feet): 500 Feet Assistive device: Rolling walker (2 wheeled) Gait Pattern/deviations: Step-through pattern;Decreased weight shift to right Gait velocity: decreased, bordering  on normal as gait distance increases Gait velocity interpretation: Below normal speed for age/gender General Gait Details: Min antalgia noted and improved with increased mobility. Good cadence and sequencing   Stairs Stairs: Yes Stairs assistance: Supervision Stair Management: Two rails;Forwards;Step to pattern Number of Stairs: 2 General stair comments: ascend and descemd 2 stairs without any diffiuclty and supervision  Wheelchair Mobility    Modified Rankin (Stroke Patients Only)       Balance Overall balance assessment: No apparent balance deficits (not formally assessed)                                           Pertinent Vitals/Pain Pain Assessment: 0-10 Pain Score: 4  Pain Location: right knee  Pain Descriptors / Indicators: Aching;Guarding;Sore Pain Intervention(s): Monitored during session;Premedicated before session;Repositioned;Ice applied    Home Living Family/patient expects to be discharged to:: Private residence Living Arrangements: Spouse/significant other Available Help at Discharge: Family;Available 24 hours/day Type of Home: House Home Access: Level entry     Home Layout: Multi-level Home Equipment: Walker - 4 wheels;Walker - 2 wheels;Bedside commode;Grab bars - tub/shower      Prior Function Level of Independence: Independent         Comments: was completely independent and driving before surgery     Hand Dominance   Dominant Hand: Right    Extremity/Trunk Assessment   Upper Extremity Assessment Upper Extremity Assessment: Overall WFL for tasks assessed    Lower Extremity Assessment Lower Extremity Assessment: RLE deficits/detail RLE Deficits /  Details: pt with post op pain and weakness. Is at least 3/5 grossly RLE with mobility.     Cervical / Trunk Assessment Cervical / Trunk Assessment: Normal  Communication   Communication: No difficulties  Cognition Arousal/Alertness: Awake/alert Behavior During Therapy:  WFL for tasks assessed/performed Overall Cognitive Status: Within Functional Limits for tasks assessed                      General Comments      Exercises Total Joint Exercises Ankle Circles/Pumps: AROM;Right;10 reps;Supine Heel Slides: AROM;Right;10 reps;Seated Long Arc Quad: AROM;Right;10 reps;Seated Goniometric ROM: 5-85   Assessment/Plan    PT Assessment All further PT needs can be met in the next venue of care  PT Problem List Decreased strength;Decreased range of motion;Decreased balance;Decreased mobility;Pain       PT Treatment Interventions      PT Goals (Current goals can be found in the Care Plan section)  Acute Rehab PT Goals Patient Stated Goal: to get home PT Goal Formulation: With patient Time For Goal Achievement: 08/22/16 Potential to Achieve Goals: Good    Frequency     Barriers to discharge        Co-evaluation               End of Session Equipment Utilized During Treatment: Gait belt Activity Tolerance: Patient tolerated treatment well Patient left: in chair;with call bell/phone within reach Nurse Communication: Mobility status;Other (comment) (pt ready for DC ) PT Visit Diagnosis: Difficulty in walking, not elsewhere classified (R26.2);Other (comment) (decreased ROM right knee)    Functional Assessment Tool Used: AM-PAC 6 Clicks Basic Mobility;Clinical judgement Functional Limitation: Mobility: Walking and moving around Mobility: Walking and Moving Around Current Status [phone]): 0 percent impaired, limited or restricted Mobility: Walking and Moving Around Goal Status [phone]): 0 percent impaired, limited or restricted Mobility: Walking and Moving Around Discharge Status [phone]): 0 percent impaired, limited or restricted    Time: 0913-0951 PT Time Calculation (min) (ACUTE ONLY): 38 min   Charges:   PT Evaluation $PT Eval Low Complexity: 1 Procedure PT Treatments $Gait Training: 23-37 mins   PT G Codes:   PT G-Codes **NOT  FOR INPATIENT CLASS** Functional Assessment Tool Used: AM-PAC 6 Clicks Basic Mobility;Clinical judgement Functional Limitation: Mobility: Walking and moving around Mobility: Walking and Moving Around Current Status (J1791): 0 percent impaired, limited or restricted Mobility: Walking and Moving Around Goal Status (T0569): 0 percent impaired, limited or restricted Mobility: Walking and Moving Around Discharge Status (V9480): 0 percent impaired, limited or restricted     Scheryl Marten PT, DPT  [phone]  08/15/2016, 10:37 AM

## 2016-08-15 NOTE — Progress Notes (Signed)
Subjective: 1 Day Post-Op Procedure(s) (LRB): RIGHT TOTAL [name] ARTHROPLASTY (Right) Patient reports pain as moderate.    Objective: Vital signs in last 24 hours: Temp:  [97.5 F (36.4 C)-99.3 F (37.4 C)] 99 F (37.2 C) (03/08 0442) Pulse Rate:  [59-84] 82 (03/08 0442) Resp:  [10-23] 18 (03/08 0442) BP: (107-157)/(61-137) 112/61 (03/08 0442) SpO2:  [95 %-100 %] 98 % (03/08 0442) Weight:  [111.1 kg (245 lb)] 111.1 kg (245 lb) (03/07 0805)  Intake/Output from previous day: 03/07 0701 - 03/08 0700 In: 1100 [I.V.:1100] Out: 1220 [Urine:1200; Blood:20] Intake/Output this shift: No intake/output data recorded.   Recent Labs  08/15/16 0455  HGB 11.7*    Recent Labs  08/15/16 0455  WBC 9.3  RBC 4.14*  HCT 36.0*  PLT 230    Recent Labs  08/15/16 0455  NA 133*  K 4.1  CL 101  CO2 26  BUN 12  CREATININE 1.09  GLUCOSE 127*  CALCIUM 8.1*   No results for input(s): LABPT, INR in the last 72 hours.  Neurologically intact Neurovascular intact Sensation intact distally Intact pulses distally Dorsiflexion/Plantar flexion intact Incision: dressing C/D/I No cellulitis present Compartment soft  Assessment/Plan: 1 Day Post-Op Procedure(s) (LRB): RIGHT TOTAL [name] ARTHROPLASTY (Right) Advance diet Up with therapy D/C IV fluids Discharge home with home health after second session of PT WBAT RLE ABLA-mild and stable  [name] 08/15/2016, 7:27 AM

## 2016-08-15 NOTE — Care Management Note (Signed)
Case Management Note  Patient Details  Name: [name] MRN: [id number] Date of Birth: 10-01-1945  Subjective/Objective:   71 yr old gentleman s/p right total knee arthroplasty.             Action/Plan: Patient was preoperatively setup with [facility], no changes. DME has been delivered to his home. Patient will have family support at discharge.    Expected Discharge Date:  08/15/16               Expected Discharge Plan:  [place]  In-House Referral:  NA  Discharge planning Services  CM Consult  Post Acute Care Choice:  Durable Medical Equipment, Home Health Choice offered to:  Patient  DME Arranged:  3-N-1, CPM, Walker rolling DME Agency:  [employer]  HH Arranged:  PT [place]:  [facility] [employer]  Status of Service:  Completed, signed off  If discussed at [employer], dates discussed:    Additional Comments:  Ninfa Meeker, RN 08/15/2016, 3:16 PM

## 2016-08-16 DIAGNOSIS — Z96651 Presence of right artificial knee joint: Secondary | ICD-10-CM | POA: Diagnosis not present

## 2016-08-16 DIAGNOSIS — M1711 Unilateral primary osteoarthritis, right knee: Secondary | ICD-10-CM | POA: Diagnosis not present

## 2016-08-17 DIAGNOSIS — F329 Major depressive disorder, single episode, unspecified: Secondary | ICD-10-CM | POA: Diagnosis not present

## 2016-08-17 DIAGNOSIS — Z96653 Presence of artificial knee joint, bilateral: Secondary | ICD-10-CM | POA: Diagnosis not present

## 2016-08-17 DIAGNOSIS — Z79891 Long term (current) use of opiate analgesic: Secondary | ICD-10-CM | POA: Diagnosis not present

## 2016-08-17 DIAGNOSIS — Z7982 Long term (current) use of aspirin: Secondary | ICD-10-CM | POA: Diagnosis not present

## 2016-08-17 DIAGNOSIS — Z471 Aftercare following joint replacement surgery: Secondary | ICD-10-CM | POA: Diagnosis not present

## 2016-08-17 DIAGNOSIS — Z87891 Personal history of nicotine dependence: Secondary | ICD-10-CM | POA: Diagnosis not present

## 2016-08-17 DIAGNOSIS — H9190 Unspecified hearing loss, unspecified ear: Secondary | ICD-10-CM | POA: Diagnosis not present

## 2016-08-17 DIAGNOSIS — G4733 Obstructive sleep apnea (adult) (pediatric): Secondary | ICD-10-CM | POA: Diagnosis not present

## 2016-08-17 DIAGNOSIS — E785 Hyperlipidemia, unspecified: Secondary | ICD-10-CM | POA: Diagnosis not present

## 2016-08-17 DIAGNOSIS — I10 Essential (primary) hypertension: Secondary | ICD-10-CM | POA: Diagnosis not present

## 2016-08-27 DIAGNOSIS — M1711 Unilateral primary osteoarthritis, right knee: Secondary | ICD-10-CM | POA: Diagnosis not present

## 2016-09-02 DIAGNOSIS — M25661 Stiffness of right knee, not elsewhere classified: Secondary | ICD-10-CM | POA: Diagnosis not present

## 2016-09-02 DIAGNOSIS — M25561 Pain in right knee: Secondary | ICD-10-CM | POA: Diagnosis not present

## 2016-09-02 DIAGNOSIS — M1711 Unilateral primary osteoarthritis, right knee: Secondary | ICD-10-CM | POA: Diagnosis not present

## 2016-09-02 DIAGNOSIS — M6281 Muscle weakness (generalized): Secondary | ICD-10-CM | POA: Diagnosis not present

## 2016-09-17 DIAGNOSIS — M1711 Unilateral primary osteoarthritis, right knee: Secondary | ICD-10-CM | POA: Diagnosis not present

## 2016-09-18 DIAGNOSIS — M1711 Unilateral primary osteoarthritis, right knee: Secondary | ICD-10-CM | POA: Diagnosis not present

## 2016-09-18 DIAGNOSIS — M25561 Pain in right knee: Secondary | ICD-10-CM | POA: Diagnosis not present

## 2016-09-18 DIAGNOSIS — M25661 Stiffness of right knee, not elsewhere classified: Secondary | ICD-10-CM | POA: Diagnosis not present

## 2016-09-18 DIAGNOSIS — M6281 Muscle weakness (generalized): Secondary | ICD-10-CM | POA: Diagnosis not present

## 2016-09-24 DIAGNOSIS — M1711 Unilateral primary osteoarthritis, right knee: Secondary | ICD-10-CM | POA: Diagnosis not present

## 2016-09-24 DIAGNOSIS — M25561 Pain in right knee: Secondary | ICD-10-CM | POA: Diagnosis not present

## 2016-09-24 DIAGNOSIS — M25661 Stiffness of right knee, not elsewhere classified: Secondary | ICD-10-CM | POA: Diagnosis not present

## 2016-09-24 DIAGNOSIS — M6281 Muscle weakness (generalized): Secondary | ICD-10-CM | POA: Diagnosis not present

## 2016-10-09 DIAGNOSIS — Z9889 Other specified postprocedural states: Secondary | ICD-10-CM | POA: Diagnosis not present

## 2016-10-09 DIAGNOSIS — H2511 Age-related nuclear cataract, right eye: Secondary | ICD-10-CM | POA: Diagnosis not present

## 2016-10-09 DIAGNOSIS — H53002 Unspecified amblyopia, left eye: Secondary | ICD-10-CM | POA: Diagnosis not present

## 2016-10-09 DIAGNOSIS — Z961 Presence of intraocular lens: Secondary | ICD-10-CM | POA: Diagnosis not present

## 2016-10-09 DIAGNOSIS — H35371 Puckering of macula, right eye: Secondary | ICD-10-CM | POA: Diagnosis not present

## 2016-11-05 DIAGNOSIS — M1711 Unilateral primary osteoarthritis, right knee: Secondary | ICD-10-CM | POA: Diagnosis not present

## 2016-11-15 ENCOUNTER — Encounter (HOSPITAL_COMMUNITY): Payer: Self-pay | Admitting: Orthopedic Surgery

## 2016-11-15 NOTE — Addendum Note (Signed)
Addendum  created 11/15/16 1040 by Lyn Hollingshead, MD   Sign clinical note

## 2016-12-03 DIAGNOSIS — Z7982 Long term (current) use of aspirin: Secondary | ICD-10-CM | POA: Diagnosis not present

## 2016-12-03 DIAGNOSIS — Z79899 Other long term (current) drug therapy: Secondary | ICD-10-CM | POA: Diagnosis not present

## 2016-12-03 DIAGNOSIS — E785 Hyperlipidemia, unspecified: Secondary | ICD-10-CM | POA: Diagnosis not present

## 2016-12-03 DIAGNOSIS — Z01818 Encounter for other preprocedural examination: Secondary | ICD-10-CM | POA: Diagnosis not present

## 2016-12-03 DIAGNOSIS — Z888 Allergy status to other drugs, medicaments and biological substances status: Secondary | ICD-10-CM | POA: Diagnosis not present

## 2016-12-03 DIAGNOSIS — I10 Essential (primary) hypertension: Secondary | ICD-10-CM | POA: Diagnosis not present

## 2016-12-03 DIAGNOSIS — Z9889 Other specified postprocedural states: Secondary | ICD-10-CM | POA: Diagnosis not present

## 2016-12-03 DIAGNOSIS — R0683 Snoring: Secondary | ICD-10-CM | POA: Insufficient documentation

## 2016-12-03 DIAGNOSIS — Z880 Allergy status to penicillin: Secondary | ICD-10-CM | POA: Diagnosis not present

## 2016-12-03 DIAGNOSIS — Z886 Allergy status to analgesic agent status: Secondary | ICD-10-CM | POA: Diagnosis not present

## 2016-12-03 DIAGNOSIS — H2511 Age-related nuclear cataract, right eye: Secondary | ICD-10-CM | POA: Diagnosis not present

## 2016-12-03 DIAGNOSIS — Z961 Presence of intraocular lens: Secondary | ICD-10-CM | POA: Diagnosis not present

## 2016-12-03 DIAGNOSIS — Z87891 Personal history of nicotine dependence: Secondary | ICD-10-CM | POA: Diagnosis not present

## 2016-12-03 DIAGNOSIS — Z9842 Cataract extraction status, left eye: Secondary | ICD-10-CM | POA: Diagnosis not present

## 2016-12-06 ENCOUNTER — Other Ambulatory Visit (HOSPITAL_COMMUNITY): Payer: PPO

## 2016-12-12 DIAGNOSIS — Z96653 Presence of artificial knee joint, bilateral: Secondary | ICD-10-CM | POA: Diagnosis not present

## 2016-12-12 DIAGNOSIS — Z886 Allergy status to analgesic agent status: Secondary | ICD-10-CM | POA: Diagnosis not present

## 2016-12-12 DIAGNOSIS — Z7982 Long term (current) use of aspirin: Secondary | ICD-10-CM | POA: Diagnosis not present

## 2016-12-12 DIAGNOSIS — I10 Essential (primary) hypertension: Secondary | ICD-10-CM | POA: Diagnosis not present

## 2016-12-12 DIAGNOSIS — E785 Hyperlipidemia, unspecified: Secondary | ICD-10-CM | POA: Diagnosis not present

## 2016-12-12 DIAGNOSIS — Z9842 Cataract extraction status, left eye: Secondary | ICD-10-CM | POA: Diagnosis not present

## 2016-12-12 DIAGNOSIS — H25811 Combined forms of age-related cataract, right eye: Secondary | ICD-10-CM | POA: Diagnosis not present

## 2016-12-12 DIAGNOSIS — Z79891 Long term (current) use of opiate analgesic: Secondary | ICD-10-CM | POA: Diagnosis not present

## 2016-12-12 DIAGNOSIS — M1990 Unspecified osteoarthritis, unspecified site: Secondary | ICD-10-CM | POA: Diagnosis not present

## 2016-12-12 DIAGNOSIS — Z880 Allergy status to penicillin: Secondary | ICD-10-CM | POA: Diagnosis not present

## 2016-12-12 DIAGNOSIS — Z961 Presence of intraocular lens: Secondary | ICD-10-CM | POA: Diagnosis not present

## 2016-12-12 DIAGNOSIS — H547 Unspecified visual loss: Secondary | ICD-10-CM | POA: Diagnosis not present

## 2016-12-12 DIAGNOSIS — Z87891 Personal history of nicotine dependence: Secondary | ICD-10-CM | POA: Diagnosis not present

## 2016-12-12 DIAGNOSIS — K219 Gastro-esophageal reflux disease without esophagitis: Secondary | ICD-10-CM | POA: Diagnosis not present

## 2016-12-12 DIAGNOSIS — Z9189 Other specified personal risk factors, not elsewhere classified: Secondary | ICD-10-CM | POA: Diagnosis not present

## 2016-12-12 DIAGNOSIS — Z79899 Other long term (current) drug therapy: Secondary | ICD-10-CM | POA: Diagnosis not present

## 2016-12-12 DIAGNOSIS — Z981 Arthrodesis status: Secondary | ICD-10-CM | POA: Diagnosis not present

## 2016-12-12 DIAGNOSIS — Z888 Allergy status to other drugs, medicaments and biological substances status: Secondary | ICD-10-CM | POA: Diagnosis not present

## 2016-12-18 ENCOUNTER — Inpatient Hospital Stay: Admit: 2016-12-18 | Payer: PPO | Admitting: Orthopedic Surgery

## 2016-12-18 ENCOUNTER — Other Ambulatory Visit: Payer: Self-pay | Admitting: Cardiology

## 2016-12-18 SURGERY — ARTHROPLASTY, KNEE, TOTAL
Anesthesia: Spinal | Laterality: Right

## 2016-12-19 NOTE — Telephone Encounter (Signed)
Rx(s) sent to pharmacy electronically.  

## 2017-02-03 NOTE — Progress Notes (Signed)
[name] Date of Birth: 03-10-1946 Medical Record #[id number]  History of Present Illness: [name] is seen for yearly follow up of HTN and hyperlipidemia. He has multiple drug intolerances. He is doing well on benazepril now reduced to 20 mg daily. Blood pressure has been controlled. He does experience occasional sharp parasternal chest pain.  He had a normal ETT in March 2015. He did undergo L4-5 back surgery in April 2017.  In October 2017 he underwent left TKR and in March 2018 he had a right TKR.  He is now followed at the [facility] for primary care.   On follow up today he notes a significant weight gain since his surgeries. He has gained about 20 lbs. He is sedentary. He notes occasional chest pain localized to the left parasternal area. Not associated with activity. Denies SOB. Sweats easily.   Current Outpatient Prescriptions on File Prior to Visit  Medication Sig Dispense Refill  . Choline Fenofibrate (FENOFIBRIC ACID) 135 MG CPDR Take 135 mg by mouth daily.    Marland Kitchen CINNAMON PO Take 1 capsule by mouth 2 (two) times daily.     Marland Kitchen ezetimibe (ZETIA) 10 MG tablet Take 1 tablet (10 mg total) by mouth daily. (Patient taking differently: Take 10 mg by mouth at bedtime. ) 90 tablet 3  . folic acid (FOLVITE) 299 MCG tablet Take 400 mcg by mouth daily.     Marland Kitchen GARLIC PO Take 1 tablet by mouth 2 (two) times daily.    . multivitamin (THERAGRAN) per tablet Take 1 tablet by mouth daily.      Marland Kitchen oxymetazoline (AFRIN) 0.05 % nasal spray Place 1-2 sprays into both nostrils 2 (two) times daily as needed for congestion.    . Polyvinyl Alcohol-Povidone (REFRESH OP) Place 1-2 drops into both eyes 3 (three) times daily.    . Pyridoxine HCl (B-6 PO) Take 1 tablet by mouth daily.    . Riboflavin (B-2 PO) Take 1 tablet by mouth daily.     No current facility-administered medications on file prior to visit.     Allergies  Allergen Reactions  . Amoxicillin Hives and Rash    Has patient had a PCN  reaction causing immediate rash, facial/tongue/throat swelling, [name]: #  #  #  YES  #  #  #  Has patient had a PCN reaction causing SEVERE RASH INVOLVING MUCUS MEMBRANES or SKIN NECROSIS: #  #  #  YES  #  #  #  Has patient had a PCN reaction that required hospitalization No Has patient had a PCN reaction occurring within the last 10 years: #  #  #  YES  #  #  #  If all of the above answers are "NO", then may proceed with Cephalosporin use.   . Codeine Other (See Comments)    *  *  Pt does not have allergy to codeine but father had severe reaction to codeine so he never wants to be given to him  *  *  . Atorvastatin Other (See Comments)    Weakness in muscles  . Statins Other (See Comments)    Weakness in muscles  . Toprol Xl [Metoprolol Succinate] Other (See Comments)    Pt states is " like a govenor on a bus"     Past Medical History:  Diagnosis Date  . Asthma    as a child  . Colon polyps   . Depression   . Dyspnea   .  Epigastric pain   . Hard of hearing   . Headache    migraines  . History of bronchitis   . History of pneumonia   . Hyperlipidemia   . Hypertension   . Knee pain   . Nausea    Occasional  . Osteoarthritis   . Sigmoid diverticulosis   . Wears glasses     Past Surgical History:  Procedure Laterality Date  . APPENDECTOMY    . BACK SURGERY    . COLONOSCOPY  11/06/2004   "several"  . EYE SURGERY Left   . HERNIA REPAIR    . JOINT REPLACEMENT    . MASS EXCISION Right 01/26/2015   Procedure: REMOVAL OF RIGHT CHEST WALL MASS;  Surgeon: Jackolyn Confer, MD;  Location: [place];  Service: General;  Laterality: Right;  . MULTIPLE TOOTH EXTRACTIONS    . POLYPECTOMY    . SHOULDER SURGERY Right 2000s   "severed muscle years beforee; broke collar bone; dr repaired both at the same time"  . TONSILLECTOMY    . TOTAL KNEE ARTHROPLASTY Left 04/10/2016   Procedure: LEFT TOTAL KNEE ARTHROPLASTY;  Surgeon: Ninetta Lights, MD;  Location: [place];  Service: Orthopedics;  Laterality: Left;  LEFT TOTAL KNEE ARTHROPLASTY  . TOTAL KNEE ARTHROPLASTY Right 08/14/2016  . TOTAL KNEE ARTHROPLASTY Right 08/14/2016   Procedure: RIGHT TOTAL KNEE ARTHROPLASTY;  Surgeon: Ninetta Lights, MD;  Location: [place];  Service: Orthopedics;  Laterality: Right;    History  Smoking Status  . Former Smoker  . Types: Cigarettes  . Quit date: 04/29/1983  Smokeless Tobacco  . Never Used    History  Alcohol Use  . 1.2 oz/week  . 1 Glasses of wine, 1 Cans of beer per week    Comment: occ alcohol    Family History  Problem Relation Age of Onset  . Heart disease Father   . Cancer Maternal Grandfather        Colon Cancer  . Colon cancer Maternal Grandfather     Review of Systems: As noted in HPI.  All other systems were reviewed and are negative.  Physical Exam: BP (!) 142/82   Pulse 68   Ht 5\' 10"  (1.778 m)   Wt 245 lb (111.1 kg)   BMI 35.15 kg/m  He is a WD WM in NAD. The HEENT exam is unremarkable.  The carotids are 2+ without bruits.  There is no thyromegaly.  There is no JVD.  The lungs are clear.  The heart exam reveals a regular rate with a normal S1 and S2.  There are no murmurs, gallops, or rubs.  The PMI is not displaced.   Abdominal exam reveals good bowel sounds.  There is no hepatosplenomegaly or tenderness.  There are no masses.  Exam of the legs reveal no clubbing, cyanosis, or edema.  The distal pulses are intact.  Cranial nerves II - XII are intact.   LABORATORY DATA: Ecg shows normal sinus rhythm with first degree AV block. Otherwise normal.   I have personally reviewed and interpreted this study.  Lab Results  Component Value Date   WBC 9.3 08/15/2016   HGB 11.7 (L) 08/15/2016   HCT 36.0 (L) 08/15/2016   PLT 230 08/15/2016   GLUCOSE 127 (H) 08/15/2016   ALT 32 03/29/2016   AST 29 03/29/2016   NA 133 (L) 08/15/2016   K 4.1 08/15/2016   CL 101 08/15/2016   CREATININE 1.09 08/15/2016   BUN  12  08/15/2016   CO2 26 08/15/2016   INR 1.04 03/29/2016    Labs reviewed from the [place] in 09/26/15: CBC, CMET, TSH all normal. A1c 5.6%. Cholesterol 226, trig 113, HDL 76, LDL 122.   Assessment / Plan: 1. Hypertension. Mildly elevated on benazepril. I think a lot of this related to weight gain and inactivity.  2. Hyperlipidemia.  Intolerant to statins in the past. Lipids previously  improved with weight loss. Continue Zetia and fenofibrate 160 mg daily. continue fish oil. He has a copy of his lab work from the [place] and will get me a copy.  3. Atypical chest pain but with above risk factors. Last ETT over 3 years ago. Not in shape to walk on treadmill now with prior bilateral TKR. Will arrange for a lexiscan myoview.   I will follow up in one year.

## 2017-02-04 ENCOUNTER — Encounter: Payer: Self-pay | Admitting: Cardiology

## 2017-02-04 ENCOUNTER — Ambulatory Visit (INDEPENDENT_AMBULATORY_CARE_PROVIDER_SITE_OTHER): Payer: PPO | Admitting: Cardiology

## 2017-02-04 VITALS — BP 142/82 | HR 68 | Ht 70.0 in | Wt 245.0 lb

## 2017-02-04 DIAGNOSIS — E782 Mixed hyperlipidemia: Secondary | ICD-10-CM | POA: Diagnosis not present

## 2017-02-04 DIAGNOSIS — R079 Chest pain, unspecified: Secondary | ICD-10-CM | POA: Diagnosis not present

## 2017-02-04 DIAGNOSIS — I10 Essential (primary) hypertension: Secondary | ICD-10-CM | POA: Diagnosis not present

## 2017-02-04 MED ORDER — BENAZEPRIL HCL 20 MG PO TABS: 20.0000 mg | ORAL_TABLET | Freq: Every day | ORAL | 3 refills | Status: DC

## 2017-02-04 NOTE — Patient Instructions (Addendum)
We will schedule you for a nuclear stress test   Lexiscan schedule in Oct  Work on walking and losing weight  Continue your current therapy

## 2017-02-21 DIAGNOSIS — M7552 Bursitis of left shoulder: Secondary | ICD-10-CM | POA: Diagnosis not present

## 2017-03-20 ENCOUNTER — Telehealth (HOSPITAL_COMMUNITY): Payer: Self-pay

## 2017-03-20 NOTE — Telephone Encounter (Signed)
Encounter complete. 

## 2017-03-26 ENCOUNTER — Ambulatory Visit (HOSPITAL_COMMUNITY)
Admission: RE | Admit: 2017-03-26 | Discharge: 2017-03-26 | Disposition: A | Discharge status: Home / Self Care | Payer: PPO | Source: Ambulatory Visit | Attending: Cardiovascular Disease | Admitting: Cardiovascular Disease

## 2017-03-26 DIAGNOSIS — R079 Chest pain, unspecified: Secondary | ICD-10-CM | POA: Diagnosis not present

## 2017-03-26 DIAGNOSIS — E669 Obesity, unspecified: Secondary | ICD-10-CM | POA: Diagnosis not present

## 2017-03-26 DIAGNOSIS — G4733 Obstructive sleep apnea (adult) (pediatric): Secondary | ICD-10-CM | POA: Insufficient documentation

## 2017-03-26 DIAGNOSIS — R5383 Other fatigue: Secondary | ICD-10-CM | POA: Insufficient documentation

## 2017-03-26 DIAGNOSIS — E782 Mixed hyperlipidemia: Secondary | ICD-10-CM | POA: Insufficient documentation

## 2017-03-26 DIAGNOSIS — I10 Essential (primary) hypertension: Secondary | ICD-10-CM | POA: Diagnosis not present

## 2017-03-26 DIAGNOSIS — Z6835 Body mass index (BMI) 35.0-35.9, adult: Secondary | ICD-10-CM | POA: Diagnosis not present

## 2017-03-26 DIAGNOSIS — K219 Gastro-esophageal reflux disease without esophagitis: Secondary | ICD-10-CM | POA: Diagnosis not present

## 2017-03-26 DIAGNOSIS — M25531 Pain in right wrist: Secondary | ICD-10-CM | POA: Diagnosis not present

## 2017-03-26 DIAGNOSIS — R0609 Other forms of dyspnea: Secondary | ICD-10-CM | POA: Diagnosis not present

## 2017-03-26 DIAGNOSIS — Z87891 Personal history of nicotine dependence: Secondary | ICD-10-CM | POA: Diagnosis not present

## 2017-03-26 DIAGNOSIS — Z8673 Personal history of transient ischemic attack (TIA), and cerebral infarction without residual deficits: Secondary | ICD-10-CM | POA: Insufficient documentation

## 2017-03-26 LAB — MYOCARDIAL PERFUSION IMAGING
CHL CUP NUCLEAR SRS: 3
CHL CUP NUCLEAR SSS: 4
CSEPPHR: 90 {beats}/min
LVDIAVOL: 99 mL (ref 62–150)
LVSYSVOL: 39 mL
Rest HR: 61 {beats}/min
SDS: 1
TID: 1.28

## 2017-03-26 MED ORDER — TECHNETIUM TC 99M TETROFOSMIN IV KIT
30.1000 | PACK | Freq: Once | INTRAVENOUS | Status: AC | PRN
  Administered 2017-03-26: 30.1 via INTRAVENOUS
  Filled 2017-03-26: qty 31

## 2017-03-26 MED ORDER — REGADENOSON 0.4 MG/5ML IV SOLN
0.4000 mg | Freq: Once | INTRAVENOUS | Status: AC
  Administered 2017-03-26: 0.4 mg via INTRAVENOUS

## 2017-03-26 MED ORDER — TECHNETIUM TC 99M TETROFOSMIN IV KIT
9.7000 | PACK | Freq: Once | INTRAVENOUS | Status: AC | PRN
  Administered 2017-03-26: 9.7 via INTRAVENOUS
  Filled 2017-03-26: qty 10

## 2017-05-06 DIAGNOSIS — M1711 Unilateral primary osteoarthritis, right knee: Secondary | ICD-10-CM | POA: Diagnosis not present

## 2017-06-20 DIAGNOSIS — M4316 Spondylolisthesis, lumbar region: Secondary | ICD-10-CM | POA: Diagnosis not present

## 2017-06-20 DIAGNOSIS — Z981 Arthrodesis status: Secondary | ICD-10-CM | POA: Diagnosis not present

## 2017-06-27 DIAGNOSIS — R3912 Poor urinary stream: Secondary | ICD-10-CM | POA: Diagnosis not present

## 2017-06-27 DIAGNOSIS — Z8042 Family history of malignant neoplasm of prostate: Secondary | ICD-10-CM | POA: Diagnosis not present

## 2017-06-27 DIAGNOSIS — N401 Enlarged prostate with lower urinary tract symptoms: Secondary | ICD-10-CM | POA: Diagnosis not present

## 2017-06-27 DIAGNOSIS — R351 Nocturia: Secondary | ICD-10-CM | POA: Diagnosis not present

## 2017-06-27 DIAGNOSIS — R39198 Other difficulties with micturition: Secondary | ICD-10-CM | POA: Diagnosis not present

## 2017-06-30 ENCOUNTER — Encounter (HOSPITAL_COMMUNITY): Payer: Self-pay | Admitting: Emergency Medicine

## 2017-06-30 ENCOUNTER — Other Ambulatory Visit: Payer: Self-pay

## 2017-06-30 ENCOUNTER — Ambulatory Visit (HOSPITAL_COMMUNITY)
Admission: EM | Admit: 2017-06-30 | Discharge: 2017-06-30 | Disposition: A | Discharge status: Home / Self Care | Payer: PPO | Attending: Family Medicine | Admitting: Family Medicine

## 2017-06-30 DIAGNOSIS — J392 Other diseases of pharynx: Secondary | ICD-10-CM

## 2017-06-30 MED ORDER — PREDNISONE 10 MG (21) PO TBPK: ORAL_TABLET | Freq: Every day | ORAL | 0 refills | Status: DC

## 2017-06-30 NOTE — ED Triage Notes (Addendum)
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  He was wearing long sleeves and gloves but he was not wearing a mask.  He was also around an area where they were cutting trees and breaking them down to chips without a mask as well.  He states his throat keeps closing up and he has a sore throat.  Pt reports a 10/10 for his throat soreness.

## 2017-06-30 NOTE — ED Provider Notes (Addendum)
[place]   [id number] 06/30/17 Arrival Time: 1030  ASSESSMENT & PLAN:  1. Throat irritation    Question allergic trigger.  Meds ordered this encounter  Medications  . predniSONE (STERAPRED UNI-PAK 21 TAB) 10 MG (21) TBPK tablet    Sig: Take by mouth daily. Take as directed.    Dispense:  21 tablet    Refill:  0  May use OTC antihistamine if desired. OTC analgesics and throat care as needed  To f/u with his PCP if not showing improvement on prednisone.  Reviewed expectations re: course of current medical issues. Questions answered. Outlined signs and symptoms indicating need for more acute intervention. Patient verbalized understanding. After Visit Summary given.   SUBJECTIVE:  [name] is a 72 y.o. male who reports a sore throat and "feeling like it's going to close up on me." Fairly abrupt onset yesterday after helping his wife lay straw. Reports being allergic to straw; causes wheezing in the past. Questions mild wheezing on and off overnight. None currently. No SOB or CP. No n/v. Normal PO intake. Able to swallow normally. Ambulatory without difficulty. Throat discomfort is bothering him the most. No specific aggravating or alleviating factors reported. Afebrile. OTC treatment: none.  ROS: As per HPI.   OBJECTIVE:  Vitals:   06/30/17 1148  BP: 124/90  Pulse: 74  Resp: 16  Temp: 98.8 F (37.1 C)  TempSrc: Oral  SpO2: 95%    General appearance: alert; no distress HEENT: throat with mild erythema, otherwise appears normal Neck: supple with FROM; cervical LAD, tender Lungs: clear to auscultation bilaterally; unlabored respirations without wheezing; able to speak full sentences without difficulty; occasional dry cough Skin: reveals no rash; warm and dry Psychological: alert and cooperative; normal mood and affect  Allergies  Allergen Reactions  . Amoxicillin Hives and Rash    Has patient had a PCN reaction causing immediate rash,  facial/tongue/throat swelling, [name]: #  #  #  YES  #  #  #  Has patient had a PCN reaction causing SEVERE RASH INVOLVING MUCUS MEMBRANES or SKIN NECROSIS: #  #  #  YES  #  #  #  Has patient had a PCN reaction that required hospitalization No Has patient had a PCN reaction occurring within the last 10 years: #  #  #  YES  #  #  #  If all of the above answers are "NO", then may proceed with Cephalosporin use.   . Codeine Other (See Comments)    *  *  Pt does not have allergy to codeine but father had severe reaction to codeine so he never wants to be given to him  *  *  . Atorvastatin Other (See Comments)    Weakness in muscles  . Statins Other (See Comments)    Weakness in muscles  . Toprol Xl [Metoprolol Succinate] Other (See Comments)    Pt states is " like a govenor on a bus"     Past Medical History:  Diagnosis Date  . Asthma    as a child  . Colon polyps   . Depression   . Dyspnea   . Epigastric pain   . Hard of hearing   . Headache    migraines  . History of bronchitis   . History of pneumonia   . Hyperlipidemia   . Hypertension   . Knee pain   . Nausea    Occasional  . Osteoarthritis   .  Sigmoid diverticulosis   . Wears glasses    Social History   Socioeconomic History  . Marital status: Married    Spouse name: Not on file  . Number of children: Not on file  . Years of education: Not on file  . Highest education level: Not on file  Social Needs  . Financial resource strain: Not on file  . Food insecurity - worry: Not on file  . Food insecurity - inability: Not on file  . Transportation needs - medical: Not on file  . Transportation needs - non-medical: Not on file  Occupational History  . Not on file  Tobacco Use  . Smoking status: Former Smoker    Types: Cigarettes    Last attempt to quit: 04/29/1983    Years since quitting: 34.1  . Smokeless tobacco: Never Used  Substance and Sexual Activity  . Alcohol use: Yes     Alcohol/week: 1.2 oz    Types: 1 Glasses of wine, 1 Cans of beer per week    Comment: occ alcohol  . Drug use: No  . Sexual activity: Not on file  Other Topics Concern  . Not on file  Social History Narrative  . Not on file   Family History  Problem Relation Age of Onset  . Heart disease Father   . Cancer Maternal Grandfather        Colon Cancer  . Colon cancer Maternal Reymundo Poll, MD 06/30/17 Sharl Ma, MD 06/30/17 [phone]

## 2017-07-04 DIAGNOSIS — H26491 Other secondary cataract, right eye: Secondary | ICD-10-CM | POA: Diagnosis not present

## 2017-08-04 IMAGING — DX DG KNEE 1-2V PORT*R*
2 series · 2 of 2 positions shown · non-contrast
Comparison: None.

CLINICAL DATA: Total right knee of placement

EXAM:
PORTABLE RIGHT KNEE - 1-2 VIEW

[knee ap]
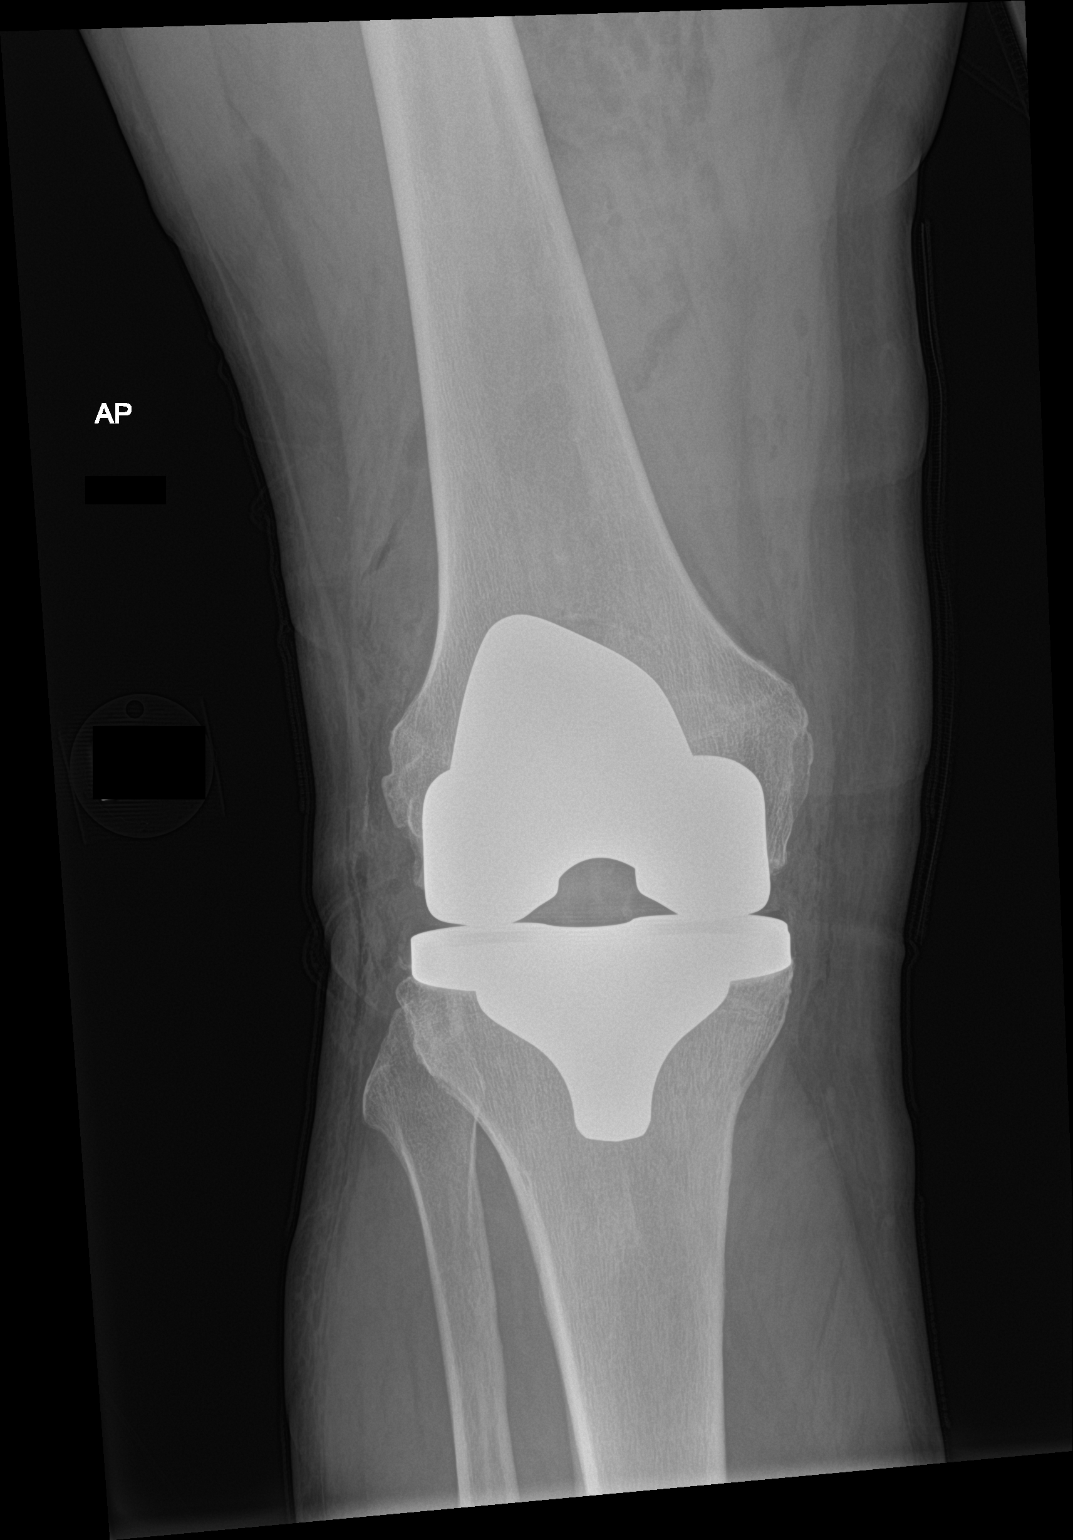

[knee lat]
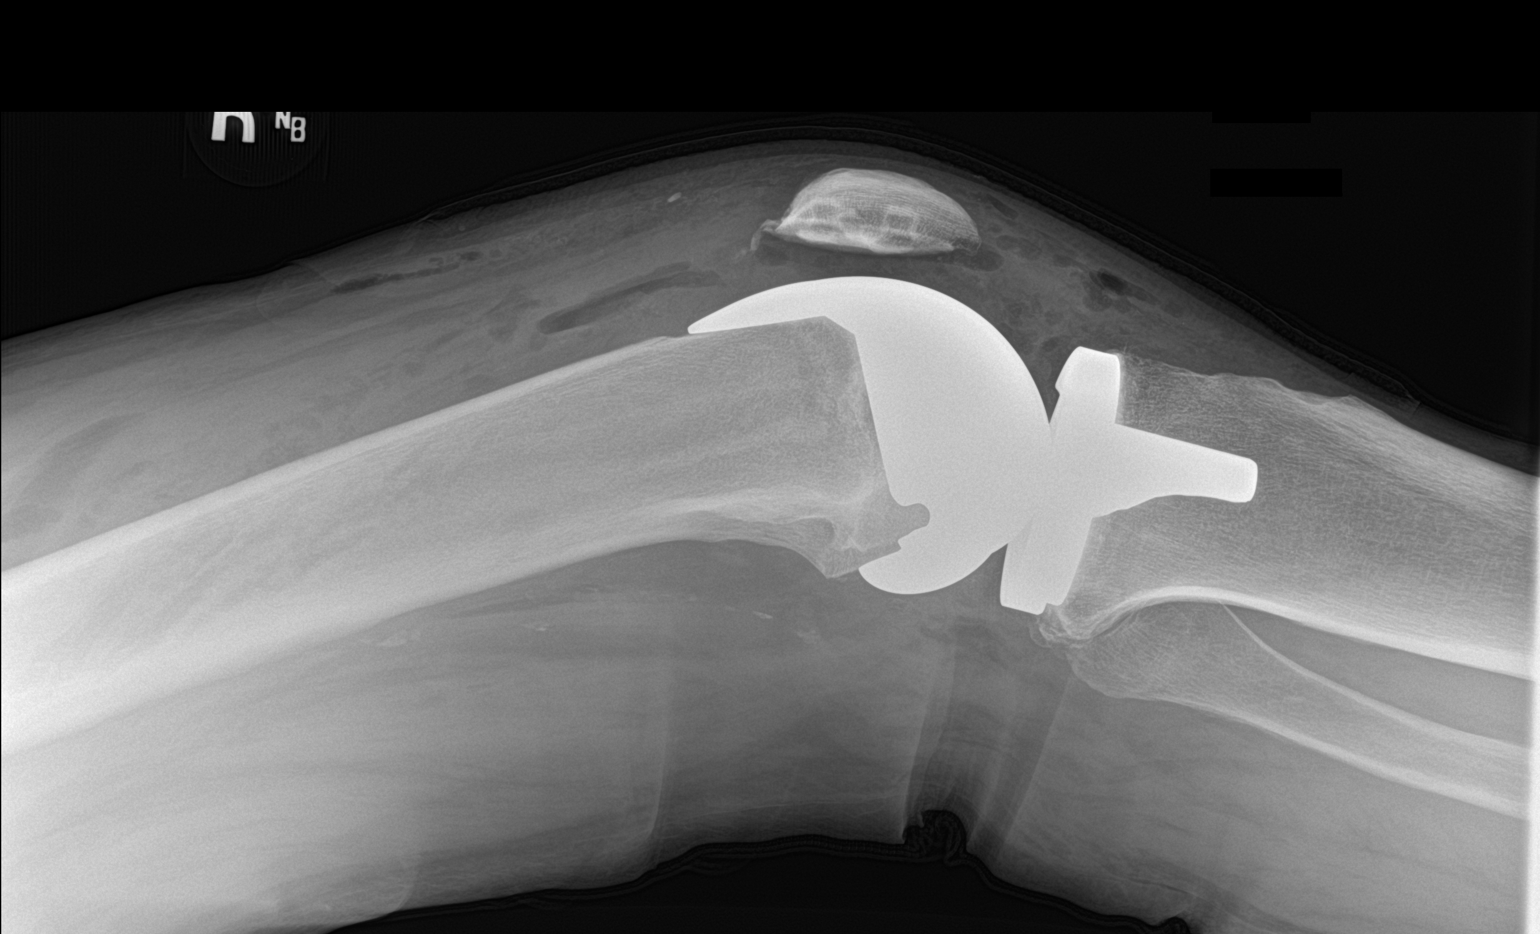

[2 of 2 positions shown; findings below may reference images not displayed]

FINDINGS: Right knee arthroplasty in satisfactory position.

No fracture or dislocation is seen.

Mild degenerative changes the patellofemoral joint.

Associated subcutaneous gas.
IMPRESSION: Right knee arthroplasty in satisfactory position.

## 2017-09-30 DIAGNOSIS — H35363 Drusen (degenerative) of macula, bilateral: Secondary | ICD-10-CM | POA: Diagnosis not present

## 2017-09-30 DIAGNOSIS — H35371 Puckering of macula, right eye: Secondary | ICD-10-CM | POA: Diagnosis not present

## 2017-09-30 DIAGNOSIS — Z961 Presence of intraocular lens: Secondary | ICD-10-CM | POA: Diagnosis not present

## 2017-09-30 DIAGNOSIS — Z9841 Cataract extraction status, right eye: Secondary | ICD-10-CM | POA: Diagnosis not present

## 2017-10-28 ENCOUNTER — Ambulatory Visit (INDEPENDENT_AMBULATORY_CARE_PROVIDER_SITE_OTHER): Payer: PPO | Admitting: Family Medicine

## 2017-10-28 ENCOUNTER — Encounter: Payer: Self-pay | Admitting: Family Medicine

## 2017-10-28 VITALS — BP 125/81 | HR 61 | Ht 70.0 in | Wt 245.4 lb

## 2017-10-28 DIAGNOSIS — I10 Essential (primary) hypertension: Secondary | ICD-10-CM

## 2017-10-28 DIAGNOSIS — E669 Obesity, unspecified: Secondary | ICD-10-CM | POA: Insufficient documentation

## 2017-10-28 DIAGNOSIS — E782 Mixed hyperlipidemia: Secondary | ICD-10-CM

## 2017-10-28 DIAGNOSIS — Z723 Lack of physical exercise: Secondary | ICD-10-CM | POA: Insufficient documentation

## 2017-10-28 NOTE — Progress Notes (Signed)
New patient office visit note:  Impression and Recommendations:    1. Mixed hyperlipidemia   2. Essential hypertension   3. Morbid obesity ([place])   4. Inactivity     1. Mixed HLD -continue meds. Check labs in near future.   2. Essential HTN -well-controlled in office today. Continue meds per cardiology, who follows him closely.   3. Morbid obesity -recommend losing weight. Pt declines referral to nutritionist at this time.    4. Inactivity -recommend daily exercise. AHA exercise and dietary guidelines discussed.  -Pt is defiant about needing routine care for his chronic condition and health maintenance. Discussion held in great detail about the importance of coming regularly for health maintenance.     -Check labs in near future.  Asked Melissa to place orders for full set of future labs today.     Education and routine counseling performed. Handouts provided.   Gross side effects, risk and benefits, and alternatives of medications discussed with patient.  Patient is aware that all medications have potential side effects and we are unable to predict every side effect or drug-drug interaction that may occur.  Expresses verbal understanding and consents to current therapy plan and treatment regimen.  Return for Chronic OV w me 69mo with FBW 2-3d prior.  Please see AVS handed out to patient at the end of our visit for further patient instructions/ counseling done pertaining to today's office visit.    Note: This document was prepared using Dragon voice recognition software and may include unintentional dictation errors.  This document serves as a record of services personally performed by Mellody Dance, DO. It was created on her behalf by Mayer Masker, a trained medical scribe. The creation of this record is based on the scribe's personal observations and the provider's statements to them.   I have reviewed the above medical documentation for accuracy and completeness  and I concur.  Mellody Dance 10/28/17 12:31 PM  ----------------------------------------------------------------------------------------------------------------------    Subjective:    Chief complaint:   Chief Complaint  Patient presents with  . Establish Care     HPI: [name] is a pleasant 72 y.o. male who presents to [place] [facility] today to review their medical history with me and establish care.   I asked the patient to review their chronic problem list with me to ensure everything was updated and accurate.    All recent office visits with other providers, any medical records that patient brought in etc  - I reviewed today.     We asked pt to get [place] their medical records from [facility] providers/ specialists that they had seen within the past 3-5 years- if they are in private practice and/or do not work for [employer], [facility], [place], [place] or [employer] owned practice.  Told them to call their specialists to clarify this if they are not sure.   -Patient spent a large amount of time today explaining to me the course of his various illnesses and how he sees various specialists for various conditions.   Personal information Pt is a English as a second language teacher. He is married.  He states he has all the information regarding a diet after seeing a nutritionist in 2016, but he currently does not measure his food or follow a diet closely. He stays busy throughout the day with physical labor but he does not exercise. His wife is diabetic and he follows the same diet as her.   Other providers He goes  to the [facility] hospital as well. He does not get all his meds from the [place]. He states he does not prefer to go to the [place] but wants to stay qualified with them. He does not want to depend on them for his routine care.   Cardiologist, Dr. Martinique, he gets BP meds from them.  Never had a heart attack or stroke/and has been asymptomatic but since his paternal grandfather died age 39 of massive  heart attack, he has gone to cardiology ever since.  [facility]- for knees- he gets celebrex from them.   Neurosurgery- [facility] at [facility] for back pain.  Ophthalmologist- Dr. Marilynne Halsted.  Urologist- Dr. Rosana Hoes.   HLD- from [place]  He was with [facility] with Dr. Osborne Casco, an internist and is switching because of personal disagreement with them.   PMHx He complains of recent weight gain in the last 2-3 years after his "health declined" and s/p back and knee surgeries.     He has seen a nutritionist before for weight gain and his last visit with them was 3 years prior. He lost 30 lbs when he went with them.   Patient has  H/o HTN- gets meds from [facility]  H/o HLD- gets meds from [place]  No h/o DM2  FMHx Family History  Problem Relation Age of Onset  . Heart disease Father   . Cancer Maternal Grandfather        Colon Cancer  . Colon cancer Maternal Grandfather   . Heart disease Paternal Grandmother   FMHx: MI paternal grandfather age 10s  PSHx Lumbar fusion 2017 with Dr. Harl Bowie, neurology, [facility].   Past Surgical History:  Procedure Laterality Date  . APPENDECTOMY    . BACK SURGERY    . COLONOSCOPY  11/06/2004   "several"  . EYE SURGERY Left   . HERNIA REPAIR    . JOINT REPLACEMENT    . MASS EXCISION Right 01/26/2015   Procedure: REMOVAL OF RIGHT CHEST WALL MASS;  Surgeon: Jackolyn Confer, MD;  Location: [place];  Service: General;  Laterality: Right;  . MULTIPLE TOOTH EXTRACTIONS    . POLYPECTOMY    . SHOULDER SURGERY Right 2000s   "severed muscle years beforee; broke collar bone; dr repaired both at the same time"  . TONSILLECTOMY    . TOTAL KNEE ARTHROPLASTY Left 04/10/2016   Procedure: LEFT TOTAL KNEE ARTHROPLASTY;  Surgeon: Ninetta Lights, MD;  Location: [place];  Service: Orthopedics;  Laterality: Left;  LEFT TOTAL KNEE ARTHROPLASTY  . TOTAL KNEE ARTHROPLASTY Right 08/14/2016  . TOTAL KNEE ARTHROPLASTY Right  08/14/2016   Procedure: RIGHT TOTAL KNEE ARTHROPLASTY;  Surgeon: Ninetta Lights, MD;  Location: [place];  Service: Orthopedics;  Laterality: Right;   SHx Social History   Socioeconomic History  . Marital status: Married    Spouse name: Not on file  . Number of children: Not on file  . Years of education: Not on file  . Highest education level: Not on file  Occupational History  . Not on file  Social Needs  . Financial resource strain: Not on file  . Food insecurity:    Worry: Not on file    Inability: Not on file  . Transportation needs:    Medical: Not on file    Non-medical: Not on file  Tobacco Use  . Smoking status: Former Smoker    Packs/day: 20.00    Years: 0.50    Pack years: 10.00  Types: Cigarettes    Last attempt to quit: 04/29/1983    Years since quitting: 34.5  . Smokeless tobacco: Never Used  Substance and Sexual Activity  . Alcohol use: Yes    Alcohol/week: 2.4 oz    Types: 4 Standard drinks or equivalent per week  . Drug use: No  . Sexual activity: Not Currently  Lifestyle  . Physical activity:    Days per week: Not on file    Minutes per session: Not on file  . Stress: Not on file  Relationships  . Social connections:    Talks on phone: Not on file    Gets together: Not on file    Attends religious service: Not on file    Active member of club or organization: Not on file    Attends meetings of clubs or organizations: Not on file    Relationship status: Not on file  . Intimate partner violence:    Fear of current or ex partner: Not on file    Emotionally abused: Not on file    Physically abused: Not on file    Forced sexual activity: Not on file  Other Topics Concern  . Not on file  Social History Narrative  . Not on file   Wt Readings from Last 3 Encounters:  10/28/17 245 lb 6.4 oz (111.3 kg)  02/04/17 245 lb (111.1 kg)  08/14/16 245 lb (111.1 kg)   BP Readings from Last 3 Encounters:  10/28/17 125/81  06/30/17 124/90  02/04/17 (!)  142/82   Pulse Readings from Last 3 Encounters:  10/28/17 61  06/30/17 74  02/04/17 68   BMI Readings from Last 3 Encounters:  10/28/17 35.21 kg/m  02/04/17 35.15 kg/m  08/14/16 35.15 kg/m    Patient Care Team    Relationship Specialty Notifications Start End  Mellody Dance, DO PCP - General Family Medicine  10/28/17   Martinique, Peter M, [place] Physician Cardiology  10/28/17   Renette Butters, MD Attending Physician Orthopedic Surgery  10/28/17   Irene Shipper, MD Consulting Physician Gastroenterology  10/28/17   Myrlene Broker, MD Attending Physician Urology  10/28/17   Marilynne Halsted, MD Referring Physician Ophthalmology  10/28/17     Patient Active Problem List   Diagnosis Date Noted  . Hyperlipidemia 08/25/2007    Priority: High  . Essential hypertension 08/25/2007    Priority: High  . Mood disorder ([place]) 08/25/2007    Priority: Medium  . GASTROESOPHAGEAL REFLUX DISEASE 08/25/2007    Priority: Medium  . Benign non-nodular prostatic hyperplasia with lower urinary tract symptoms 04/11/2014    Priority: Low  . 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    Priority: Low  . Morbid obesity ([place]) 10/28/2017  . Inactivity 10/28/2017  . Family history of malignant neoplasm of prostate 06/27/2017  . Snores 12/03/2016  . Primary localized osteoarthritis of right knee 08/14/2016  . Primary localized osteoarthritis of left knee 04/10/2016  . History of radial keratotomy 02/05/2016  . Macular puckering, right eye 01/02/2016  . Nuclear sclerotic cataract of right eye 01/02/2016  . Pseudophakia of left eye 01/02/2016  . S/P lumbar fusion 10/24/2014  . Degenerative spondylolisthesis 09/28/2014  . Spinal stenosis of lumbar region 09/14/2014  . Midline low back pain without sciatica 07/18/2014  . DDD (degenerative disc disease), lumbosacral 07/18/2014  . Dyspnea 07/19/2013  . COLONIC POLYPS, ADENOMATOUS 08/25/2007  . EXTERNAL HEMORRHOIDS 08/25/2007    . ASTHMA 08/25/2007  . ESOPHAGITIS 08/25/2007  .  GASTRITIS 08/25/2007  . DIVERTICULOSIS OF COLON 08/25/2007  . OSTEOARTHRITIS 08/25/2007  . Sleep apnea 08/25/2007  . PYROSIS 08/25/2007  . EPIGASTRIC PAIN 08/25/2007     Past Medical History:  Diagnosis Date  . Asthma    as a child  . Colon polyps   . Depression   . Dyspnea   . Epigastric pain   . Hard of hearing   . Headache    migraines  . History of bronchitis   . History of pneumonia   . Hyperlipidemia   . Hypertension   . Knee pain   . Nausea    Occasional  . Osteoarthritis   . Sigmoid diverticulosis   . Wears glasses      Past Medical History:  Diagnosis Date  . Asthma    as a child  . Colon polyps   . Depression   . Dyspnea   . Epigastric pain   . Hard of hearing   . Headache    migraines  . History of bronchitis   . History of pneumonia   . Hyperlipidemia   . Hypertension   . Knee pain   . Nausea    Occasional  . Osteoarthritis   . Sigmoid diverticulosis   . Wears glasses      Past Surgical History:  Procedure Laterality Date  . APPENDECTOMY    . BACK SURGERY    . COLONOSCOPY  11/06/2004   "several"  . EYE SURGERY Left   . HERNIA REPAIR    . JOINT REPLACEMENT    . MASS EXCISION Right 01/26/2015   Procedure: REMOVAL OF RIGHT CHEST WALL MASS;  Surgeon: Jackolyn Confer, MD;  Location: [place];  Service: General;  Laterality: Right;  . MULTIPLE TOOTH EXTRACTIONS    . POLYPECTOMY    . SHOULDER SURGERY Right 2000s   "severed muscle years beforee; broke collar bone; dr repaired both at the same time"  . TONSILLECTOMY    . TOTAL KNEE ARTHROPLASTY Left 04/10/2016   Procedure: LEFT TOTAL KNEE ARTHROPLASTY;  Surgeon: Ninetta Lights, MD;  Location: [place];  Service: Orthopedics;  Laterality: Left;  LEFT TOTAL KNEE ARTHROPLASTY  . TOTAL KNEE ARTHROPLASTY Right 08/14/2016  . TOTAL KNEE ARTHROPLASTY Right 08/14/2016   Procedure: RIGHT TOTAL KNEE ARTHROPLASTY;  Surgeon: Ninetta Lights, MD;  Location: [place];  Service: Orthopedics;  Laterality: Right;     Family History  Problem Relation Age of Onset  . Heart disease Father   . Cancer Maternal Grandfather        Colon Cancer  . Colon cancer Maternal Grandfather   . Heart disease Paternal Grandmother      Social History   Substance and Sexual Activity  Drug Use No     Social History   Substance and Sexual Activity  Alcohol Use Yes  . Alcohol/week: 2.4 oz  . Types: 4 Standard drinks or equivalent per week     Social History   Tobacco Use  Smoking Status Former Smoker  . Packs/day: 20.00  . Years: 0.50  . Pack years: 10.00  . Types: Cigarettes  . Last attempt to quit: 04/29/1983  . Years since quitting: 34.5  Smokeless Tobacco Never Used     Current Meds  Medication Sig  . aspirin EC 81 MG tablet Take 81 mg by mouth daily.  . benazepril (LOTENSIN) 20 MG tablet Take 1 tablet (20 mg total) by mouth daily.  . Calcium Carbonate-Vitamin D (CALCIUM 600/VITAMIN D PO)  Take 1 tablet by mouth daily.  . celecoxib (CELEBREX) 200 MG capsule Take 1 capsule by mouth daily.  . Choline Fenofibrate (FENOFIBRIC ACID) 135 MG CPDR Take 135 mg by mouth daily.  Marland Kitchen CINNAMON PO Take 1 capsule by mouth 2 (two) times daily.   Marland Kitchen ezetimibe (ZETIA) 10 MG tablet Take 1 tablet (10 mg total) by mouth daily. (Patient taking differently: Take 10 mg by mouth at bedtime. )  . Garlic 4627 MG CAPS Take 1,000 mg by mouth 2 (two) times a week.    Allergies: Amoxicillin; Codeine; Atorvastatin; Statins; and Toprol xl [metoprolol succinate]   Review of Systems  Constitutional: Negative for chills, diaphoresis, fever, malaise/fatigue and weight loss.       Complains of weight gain.  HENT: Negative for congestion, sore throat and tinnitus.   Eyes: Negative for blurred vision, double vision and photophobia.  Respiratory: Negative for cough and wheezing.        Does have wheezing from time to time-chronic  Cardiovascular:  Negative for chest pain and palpitations.  Gastrointestinal: Negative for blood in stool, diarrhea, nausea and vomiting.  Genitourinary: Negative for dysuria, frequency and urgency.  Musculoskeletal: Negative for joint pain and myalgias.  Skin: Negative for itching and rash.  Neurological: Negative for dizziness, focal weakness, weakness and headaches.  Endo/Heme/Allergies: Positive for environmental allergies. Negative for polydipsia. Bruises/bleeds easily.  Psychiatric/Behavioral: Negative for depression and memory loss. The patient is not nervous/anxious and does not have insomnia.      Objective:   Blood pressure 125/81, pulse 61, height 5\' 10"  (1.778 m), weight 245 lb 6.4 oz (111.3 kg), SpO2 97 %. Body mass index is 35.21 kg/m. General: Well Developed, well nourished, and in no acute distress.  Neuro: Alert and oriented x3, extra-ocular muscles intact, sensation grossly intact.  HEENT:[place]/AT, PERRLA, neck supple, No carotid bruits Skin: no gross rashes  Cardiac: Regular rate and rhythm Respiratory: Essentially clear to auscultation bilaterally. Not using accessory muscles, speaking in full sentences.  Abdominal: not grossly distended Musculoskeletal: Ambulates w/o diff, FROM * 4 ext.  Vasc: less 2 sec cap RF, warm and pink  Psych:  No HI/SI, judgement and insight good, Euthymic mood. Full Affect.    No results found for this or any previous visit (from the past 2160 hour(s)).

## 2017-10-28 NOTE — Patient Instructions (Signed)
Please let me know if you change your mind regarding the nutrition consultation to see a dietitian for weight loss.       Behavior Modification Ideas for Weight Management  Weight management involves adopting a healthy lifestyle that includes a knowledge of nutrition and exercise, a positive attitude and the right kind of motivation. Internal motives such as better health, increased energy, self-esteem and personal control increase your chances of lifelong weight management success.  Remember to have realistic goals and think long-term success. Believe in yourself and you can do it. The following information will give you ideas to help you meet your goals.  Control Your Home Environment  Eat only while sitting down at the kitchen or dining room table. Do not eat while watching television, reading, cooking, talking on the phone, standing at the refrigerator or working on the computer. Keep tempting foods out of the house - don't buy them. Keep tempting foods out of sight. Have low-calorie foods ready to eat. Unless you are preparing a meal, stay out of the kitchen. Have healthy snacks at your disposal, such as small pieces of fruit, vegetables, canned fruit, pretzels, low-fat string cheese and nonfat cottage cheese.  Control Your Work Environment  Do not eat at [employer] or keep tempting snacks at your desk. If you get hungry between meals, plan healthy snacks and bring them with you to work. During your breaks, go for a walk instead of eating. If you work around food, plan in advance the one item you will eat at mealtime. Make it inconvenient to nibble on food by chewing gum, sugarless candy or drinking water or another low-calorie beverage. Do not work through meals. Skipping meals slows down metabolism and may result in overeating at the next meal. If food is available for special occasions, either pick the healthiest item, nibble on low-fat snacks brought from home, don't have anything  offered, choose one option and have a small amount, or have only a beverage.  Control Your Mealtime Environment  Serve your plate of food at the stove or kitchen counter. Do not put the serving dishes on the table. If you do put dishes on the table, remove them immediately when finished eating. Fill half of your plate with vegetables, a quarter with lean protein and a quarter with starch. Use smaller plates, bowls and glasses. A smaller portion will look large when it is in a little dish. Politely refuse second helpings. When fixing your plate, limit portions of food to one scoop/serving or less.   Daily Food Management  Replace eating with another activity that you will not associate with food. Wait 20 minutes before eating something you are craving. Drink a large glass of water or diet soda before eating. Always have a big glass or bottle of water to drink throughout the day. Avoid high-calorie add-ons such as cream with your coffee, butter, mayonnaise and salad dressings.  Shopping: Do not shop when hungry or tired. Shop from a list and avoid buying anything that is not on your list. If you must have tempting foods, buy individual-sized packages and try to find a lower-calorie alternative. Don't taste test in the store. Read food labels. Compare products to help you make the healthiest choices.  Preparation: Chew a piece of gum while cooking meals. Use a quarter teaspoon if you taste test your food. Try to only fix what you are going to eat, leaving yourself no chance for seconds. If you have prepared more food than you  need, portion it into individual containers and freeze or refrigerate immediately. Don't snack while cooking meals.  Eating: Eat slowly. Remember it takes about 20 minutes for your stomach to send a message to your brain that it is full. Don't let fake hunger make you think you need more. The ideal way to eat is to take a bite, put your utensil down, take a sip  of water, cut your next bite, take a bit, put your utensil down and so on. Do not cut your food all at one time. Cut only as needed. Take small bites and chew your food well. Stop eating for a minute or two at least once during a meal or snack. Take breaks to reflect and have conversation.  Cleanup and Leftovers: Label leftovers for a specific meal or snack. Freeze or refrigerate individual portions of leftovers. Do not clean up if you are still hungry.  Eating Out and Social Eating  Do not arrive hungry. Eat something light before the meal. Try to fill up on low-calorie foods, such as vegetables and fruit, and eat smaller portions of the high-calorie foods. Eat foods that you like, but choose small portions. If you want seconds, wait at least 20 minutes after you have eaten to see if you are actually hungry or if your eyes are bigger than your stomach. Limit alcoholic beverages. Try a soda water with a twist of lime. Do not skip other meals in the day to save room for the special event.  At Restaurants: Order  la carte rather than buffet style. Order some vegetables or a salad for an appetizer instead of eating bread. If you order a high-calorie dish, share it with someone. Try an after-dinner mint with your coffee. If you do have dessert, share it with two or more people. Don't overeat because you do not want to waste food. Ask for a doggie bag to take extra food home. Tell the server to put half of your entree in a to go bag before the meal is served to you. Ask for salad dressing, gravy or high-fat sauces on the side. Dip the tip of your fork in the dressing before each bite. If bread is served, ask for only one piece. Try it plain without butter or oil. At [employer] where oil and vinegar is served with bread, use only a small amount of oil and a lot of vinegar for dipping.  At a Friend[facility] House: Offer to bring a dish, appetizer or dessert that is low in calories. Serve  yourself small portions or tell the host that you only want a small amount. Stand or sit away from the snack table. Stay away from the kitchen or stay busy if you are near the food. Limit your alcohol intake.  At [employer] and Cafeterias: Cover most of your plate with lettuce and/or vegetables. Use a salad plate instead of a dinner plate. After eating, clear away your dishes before having coffee or tea.  Entertaining at Home: Explore low-fat, low-cholesterol cookbooks. Use single-serving foods like chicken breasts or hamburger patties. Prepare low-calorie appetizers and desserts.   Holidays: Keep tempting foods out of sight. Decorate the house without using food. Have low-calorie beverages and foods on hand for guests. Allow yourself one planned treat a day. Don't skip meals to save up for the holiday feast. Eat regular, planned meals.   Exercise Well  Make exercise a priority and a planned activity in the day. If possible, walk the entire or part of  the distance to work. Get an exercise buddy. Go for a walk with a colleague during one of your breaks, go to the gym, run or take a walk with a friend, walk in the mall with a shopping companion. Park at the end of the parking lot and walk to the store or office entrance. Always take the stairs all of the way or at least part of the way to your floor. If you have a desk job, walk around the office frequently. Do leg lifts while sitting at your desk. Do something outside on the weekends like going for a hike or a bike ride.   Have a Healthy Attitude  Make health your weight management priority. Be realistic. Have a goal to achieve a healthier you, not necessarily the lowest weight or ideal weight based on calculations or tables. Focus on a healthy eating style, not on dieting. Dieting usually lasts for a short amount of time and rarely produces long-term success. Think long term. You are developing new healthy behaviors to follow  next month, in a year and in a decade.    This information is for educational purposes only and is not intended to replace the advice of your doctor or health care provider. We encourage you to discuss with your doctor any questions or concerns you may have.        Guidelines for Losing Weight   We want weight loss that will last so you should lose 1-2 pounds a week.  THAT IS IT! Please pick THREE things a month to change. Once it is a habit check off the item. Then pick another three items off the list to become habits.  If you are already doing a habit on the list GREAT!  Cross that item off!  Don't drink your calories. Ie, alcohol, soda, fruit juice, and sweet tea.   Drink more water. Drink a glass when you feel hungry or before each meal.   Eat breakfast - Complex carb and protein (likeDannon light and fit yogurt, oatmeal, fruit, eggs, [place] bacon).  Measure your cereal.  Eat no more than one cup a day. (ie Kashi)  Eat an apple a day.  Add a vegetable a day.  Try a new vegetable a month.  Use Pam! Stop using oil or butter to cook.  Don't finish your plate or use smaller plates.  Share your dessert.  Eat sugar free Jello for dessert or frozen grapes.  Don't eat 2-3 hours before bed.  Switch to whole wheat bread, pasta, and brown rice.  Make healthier choices when you eat out. No fries!  Pick baked chicken, NOT fried.  Don't forget to SLOW DOWN when you eat. It is not going anywhere.   Take the stairs.  Park far away in the parking lot  Lift soup cans (or weights) for 10 minutes while watching TV.  Walk at work for 10 minutes during break.  Walk outside 1 time a week with your friend, kids, dog, or significant other.  Start a walking group at church.  Walk the mall as much as you can tolerate.   Keep a food diary.  Weigh yourself daily.  Walk for 15 minutes 3 days per week.  Cook at home more often and eat out less. If life happens and you go  back to old habits, it is okay.  Just start over. You can do it!  If you experience chest pain, get short of breath, or tired during the exercise, please  stop immediately and inform your doctor.    Before you even begin to attack a weight-loss plan, it pays to remember this: You are not fat. You have fat. Losing weight isn't about blame or shame; it's simply another achievement to accomplish. Dieting is like any other skill-you have to buckle down and work at it. As long as you act in a smart, reasonable way, you'll ultimately get where you want to be. Here are some weight loss pearls for you.   1. It's Not a Diet. It's a Lifestyle Thinking of a diet as something you're on and suffering through only for the short term doesn't work. To shed weight and keep it off, you need to make permanent changes to the way you eat. It's OK to indulge occasionally, of course, but if you cut calories temporarily and then revert to your old way of eating, you'll gain back the weight quicker than you can say yo-yo. Use it to lose it. Research shows that one of the best predictors of long-term weight loss is how many pounds you drop in the first month. For that reason, nutritionists often suggest being stricter for the first two weeks of your new eating strategy to build momentum. Cut out added sugar and alcohol and avoid unrefined carbs. After that, figure out how you can reincorporate them in a way that's healthy and maintainable.  2. There's a Right Way to Exercise Working out burns calories and fat and boosts your metabolism by building muscle. But those trying to lose weight are notorious for overestimating the number of calories they burn and underestimating the amount they take in. Unfortunately, your system is biologically programmed to hold on to extra pounds and that means when you start exercising, your body senses the deficit and ramps up its hunger signals. If you're not diligent, you'll eat everything you burn  and then some. Use it, to lose it. Cardio gets all the exercise glory, but strength and interval training are the real heroes. They help you build lean muscle, which in turn increases your metabolism and calorie-burning ability 3. Don't Overreact to Mild Hunger Some people have a hard time losing weight because of hunger anxiety. To them, being hungry is bad-something to be avoided at all costs-so they carry snacks with them and eat when they don't need to. Others eat because they're stressed out or bored. While you never want to get to the point of being ravenous (that's when bingeing is likely to happen), a hunger pang, a craving, or the fact that it's 3:00 p.m. should not send you racing for the vending machine or obsessing about the energy bar in your purse. Ideally, you should put off eating until your stomach is growling and it's difficult to concentrate.  Use it to lose it. When you feel the urge to eat, use the HALT method. Ask yourself, Am I really hungry? Or am I angry or anxious, lonely or bored, or tired? If you're still not certain, try the apple test. If you're truly hungry, an apple should seem delicious; if it doesn't, something else is going on. Or you can try drinking water and making yourself busy, if you are still hungry try a healthy snack.  4. Not All Calories Are Created Equal The mechanics of weight loss are pretty simple: Take in fewer calories than you use for energy. But the kind of food you eat makes all the difference. Processed food that's high in saturated fat and refined starch or sugar  can cause inflammation that disrupts the hormone signals that tell your brain you're full. The result: You eat a lot more.  Use it to lose it. Clean up your diet. Swap in whole, unprocessed foods, including vegetables, lean protein, and healthy fats that will fill you up and give you the biggest nutritional bang for your calorie buck. In a few weeks, as your brain starts receiving regular hunger  and fullness signals once again, you'll notice that you feel less hungry overall and naturally start cutting back on the amount you eat.  5. Protein, Produce, and Plant-Based Fats Are Your Weight-Loss Trinity Here's why eating the three Ps regularly will help you drop pounds. Protein fills you up. You need it to build lean muscle, which keeps your metabolism humming so that you can torch more fat. People in a weight-loss program who ate double the recommended daily allowance for protein (about 110 grams for a 150-pound woman) lost 70 percent of their weight from fat, while people who ate the RDA lost only about 40 percent, one study found. Produce is packed with filling fiber. "It's very difficult to consume too many calories if you're eating a lot of vegetables. Example: Three cups of broccoli is a lot of food, yet only 93 calories. (Fruit is another story. It can be easy to overeat and can contain a lot of calories from sugar, so be sure to monitor your intake.) Plant-based fats like olive oil and those in avocados and nuts are healthy and extra satiating.  Use it to lose it. Aim to incorporate each of the three Ps into every meal and snack. People who eat protein throughout the day are able to keep weight off, according to a study in the [employer]. In addition to meat, poultry and seafood, good sources are beans, lentils, eggs, tofu, and yogurt. As for fat, keep portion sizes in check by measuring out salad dressing, oil, and nut butters (shoot for one to two tablespoons). Finally, eat veggies or a little fruit at every meal. People who did that consumed 308 fewer calories but didn't feel any hungrier than when they didn't eat more produce.  7. How You Eat Is As Important As What You Eat In order for your brain to register that you're full, you need to focus on what you're eating. Sit down whenever you eat, preferably at a table. Turn off the TV or computer, put down your  phone, and look at your food. Smell it. Chew slowly, and don't put another bite on your fork until you swallow. When women ate lunch this attentively, they consumed 30 percent less when snacking later than those who listened to an audiobook at lunchtime, according to a study in the [place] of Nutrition. 8. Weighing Yourself Really Works The scale provides the best evidence about whether your efforts are paying off. Seeing the numbers tick up or down or stagnate is motivation to keep going-or to rethink your approach. A 2015 study at [facility] found that daily weigh-ins helped people lose more weight, keep it off, and maintain that loss, even after two years. Use it to lose it. Step on the scale at the same time every day for the best results. If your weight shoots up several pounds from one weigh-in to the next, don't freak out. Eating a lot of salt the night before or having your period is the likely culprit. The number should return to normal in a day or two. It's  a steady climb that you need to do something about. 9. Too Much Stress and Too Little Sleep Are Your Enemies When you're tired and frazzled, your body cranks up the production of cortisol, the stress hormone that can cause carb cravings. Not getting enough sleep also boosts your levels of ghrelin, a hormone associated with hunger, while suppressing leptin, a hormone that signals fullness and satiety. People on a diet who slept only five and a half hours a night for two weeks lost 55 percent less fat and were hungrier than those who slept eight and a half hours, according to a study in the [place]. Use it to lose it. Prioritize sleep, aiming for seven hours or more a night, which research shows helps lower stress. And make sure you're getting quality zzz's. If a snoring spouse or a fidgety cat wakes you up frequently throughout the night, you may end up getting the equivalent of just four hours of sleep,  according to a study from [facility]. Keep pets out of the bedroom, and use a white-noise app to drown out snoring. 10. You Will Hit a plateau-And You Can Bust Through It As you slim down, your body releases much less leptin, the fullness hormone.  If you're not strength training, start right now. Building muscle can raise your metabolism to help you overcome a plateau. To keep your body challenged and burning calories, incorporate new moves and more intense intervals into your workouts or add another sweat session to your weekly routine. Alternatively, cut an extra 100 calories or so a day from your diet. Now that you've lost weight, your body simply doesn't need as much fuel.    Since food equals calories, in order to lose weight you must either eat fewer calories, exercise more to burn off calories with activity, or both. Food that is not used to fuel the body is stored as fat. A major component of losing weight is to make smarter food choices. Here's how:  1)   Limit non-nutritious foods, such as: Sugar, honey, syrups and candy Pastries, donuts, pies, cakes and cookies Soft drinks, sweetened juices and alcoholic beverages  2)  Cut down on high-fat foods by: - Choosing poultry, fish or lean red meat - Choosing low-fat cooking methods, such as baking, broiling, steaming, grilling and boiling - Using low-fat or non-fat dairy products - Using vinaigrette, herbs, lemon or fat-free salad dressings - Avoiding fatty meats, such as bacon, sausage, franks, ribs and luncheon meats - Avoiding high-fat snacks like nuts, chips and chocolate - Avoiding fried foods - Using less butter, margarine, oil and mayonnaise - Avoiding high-fat gravies, cream sauces and cream-based soups  3) Eat a variety of foods, including: - Fruit and vegetables that are raw, steamed or baked - Whole grains, breads, cereal, rice and pasta - Dairy products, such as low-fat or non-fat milk or yogurt, low-fat cottage  cheese and low-fat cheese - Protein-rich foods like chicken, [place], fish, lean meat and legumes, or beans  4) Change your eating habits by: - Eat three balanced meals a day to help control your hunger - Watch portion sizes and eat small servings of a variety of foods - Choose low-calorie snacks - Eat only when you are hungry and stop when you are satisfied - Eat slowly and try not to perform other tasks while eating - Find other activities to distract you from food, such as walking, taking up a hobby or being involved in the community -  Include regular exercise in your daily routine ( minimum of 20 min of moderate-intensity exercise at least 5 days/week)  - Find a support group, if necessary, for emotional support in your weight loss journey           Easy ways to cut 100 calories   1. Eat your eggs with hot sauce OR salsa instead of cheese.  Eggs are great for breakfast, but many people consider eggs and cheese to be BFFs. Instead of cheese-1 oz. of cheddar has 114 calories-top your eggs with hot sauce, which contains no calories and helps with satiety and metabolism. Salsa is also a great option!!  2. Top your toast, waffles or pancakes with fresh berries instead of jelly or syrup. Half a cup of berries-fresh, frozen or thawed-has about 40 calories, compared with 2 tbsp. of maple syrup or jelly, which both have about 100 calories. The berries will also give you a good punch of fiber, which helps keep you full and satisfied and won't spike blood sugar quickly like the jelly or syrup. 3. Swap the non-fat latte for black coffee with a splash of half-and-half. Contrary to its name, that non-fat latte has 130 calories and a startling 19g of carbohydrates per 16 oz. serving. Replacing that 'light' drinkable dessert with a black coffee with a splash of half-and-half saves you more than 100 calories per 16 oz. serving. 4. Sprinkle salads with freeze-dried raspberries instead of dried  cranberries. If you want a sweet addition to your nutritious salad, stay away from dried cranberries. They have a whopping 130 calories per  cup and 30g carbohydrates. Instead, sprinkle freeze-dried raspberries guilt-free and save more than 100 calories per  cup serving, adding 3g of belly-filling fiber. 5. Go for mustard in place of mayo on your sandwich. Mustard can add really nice flavor to any sandwich, and there are tons of varieties, from spicy to honey. A serving of mayo is 95 calories, versus 10 calories in a serving of mustard.  Or try an avocado mayo spread: You can find the recipe few click this link: https://www.californiaavocado.com/recipes/recipe-container/[place]-avocado-mayo 6. Choose a DIY salad dressing instead of the store-bought kind. Mix Dijon or whole grain mustard with low-fat Kefir or red wine vinegar and garlic. 7. Use hummus as a spread instead of a dip. Use hummus as a spread on a high-fiber cracker or tortilla with a sandwich and save on calories without sacrificing taste. 8. Pick just one salad "accessory." Salad isn't automatically a calorie winner. It's easy to over-accessorize with toppings. Instead of topping your salad with nuts, avocado and cranberries (all three will clock in at 313 calories), just pick one. The next day, choose a different accessory, which will also keep your salad interesting. You don't wear all your jewelry every day, right? 9. Ditch the white pasta in favor of spaghetti squash. One cup of cooked spaghetti squash has about 40 calories, compared with traditional spaghetti, which comes with more than 200. Spaghetti squash is also nutrient-dense. It's a good source of fiber and Vitamins A and C, and it can be eaten just like you would eat pasta-with a great tomato sauce and [place] meatballs or with pesto, tofu and spinach, for example. 10. Dress up your chili, soups and stews with non-fat [place] yogurt instead of sour cream. Just a 'dollop' of  sour cream can set you back 115 calories and a whopping 12g of fat-seven of which are of the artery-clogging variety. Added bonus: [place] yogurt is packed with muscle-building  protein, calcium and B Vitamins. 11. Mash cauliflower instead of mashed potatoes. One cup of traditional mashed potatoes-in all their creamy goodness-has more than 200 calories, compared to mashed cauliflower, which you can typically eat for less than 100 calories per 1 cup serving. Cauliflower is a great source of the antioxidant indole-3-carbinol (I3C), which may help reduce the risk of some cancers, like breast cancer. 12. Ditch the ice cream sundae in favor of a [place] yogurt parfait. Instead of a cup of ice cream or fro-yo for dessert, try 1 cup of nonfat Greek yogurt topped with fresh berries and a sprinkle of cacao nibs. Both toppings are packed with antioxidants, which can help reduce cellular inflammation and oxidative damage. And the comparison is a no-brainer: One cup of ice cream has about 275 calories; one cup of frozen yogurt has about 230; and a cup of Greek yogurt has just 130, plus twice the protein, so you're less likely to return to the freezer for a second helping. 13. Put olive oil in a spray container instead of using it directly from the bottle. Each tablespoon of olive oil is 120 calories and 15g of fat. Use a mister instead of pouring it straight into the pan or onto a salad. This allows for portion control and will save you more than 100 calories. 14. When baking, substitute canned pumpkin for butter or oil. Canned pumpkin-not pumpkin pie mix-is loaded with Vitamin A, which is important for skin and eye health, as well as immunity. And the comparisons are pretty crazy:  cup of canned pumpkin has about 40 calories, compared to butter or oil, which has more than 800 calories. Yes, 800 calories. Applesauce and mashed banana can also serve as good substitutions for butter or oil, usually in a 1:1 ratio. 15. Top  casseroles with high-fiber cereal instead of breadcrumbs. Breadcrumbs are typically made with white bread, while breakfast cereals contain 5-9g of fiber per serving. Not only will you save more than 150 calories per  cup serving, the swap will also keep you more full and you'll get a metabolism boost from the added fiber. 16. Snack on pistachios instead of macadamia nuts. Believe it or not, you get the same amount of calories from 35 pistachios (100 calories) as you would from only five macadamia nuts. 17. Chow down on kale chips rather than potato chips. This is my favorite 'don't knock it 'till you try it' swap. Kale chips are so easy to make at home, and you can spice them up with a little grated parmesan or chili powder. Plus, they're a mere fraction of the calories of potato chips, but with the same crunch factor we crave so often. 18. Add seltzer and some fruit slices to your cocktail instead of soda or fruit juice. One cup of soda or fruit juice can pack on as much as 140 calories. Instead, use seltzer and fruit slices. The fruit provides valuable phytochemicals, such as flavonoids and anthocyanins, which help to combat cancer and stave off the aging process.

## 2017-10-28 NOTE — Addendum Note (Signed)
Addended by: Lanier Prude D on: 10/28/2017 01:16 PM   Modules accepted: Orders

## 2018-01-16 ENCOUNTER — Other Ambulatory Visit: Payer: Self-pay | Admitting: Cardiology

## 2018-01-16 DIAGNOSIS — I10 Essential (primary) hypertension: Secondary | ICD-10-CM

## 2018-01-16 DIAGNOSIS — E782 Mixed hyperlipidemia: Secondary | ICD-10-CM

## 2018-01-16 DIAGNOSIS — R079 Chest pain, unspecified: Secondary | ICD-10-CM

## 2018-01-26 ENCOUNTER — Other Ambulatory Visit: Payer: PPO

## 2018-01-26 DIAGNOSIS — I10 Essential (primary) hypertension: Secondary | ICD-10-CM

## 2018-01-26 DIAGNOSIS — Z723 Lack of physical exercise: Secondary | ICD-10-CM

## 2018-01-26 DIAGNOSIS — E782 Mixed hyperlipidemia: Secondary | ICD-10-CM | POA: Diagnosis not present

## 2018-01-26 NOTE — Addendum Note (Signed)
Addended by: Fonnie Mu on: 01/26/2018 08:56 AM   Modules accepted: Orders

## 2018-01-27 LAB — COMPREHENSIVE METABOLIC PANEL
A/G RATIO: 2 (ref 1.2–2.2)
ALBUMIN: 4.3 g/dL (ref 3.5–4.8)
ALT: 16 IU/L (ref 0–44)
AST: 16 IU/L (ref 0–40)
Alkaline Phosphatase: 41 IU/L (ref 39–117)
BUN/Creatinine Ratio: 18 (ref 10–24)
BUN: 22 mg/dL (ref 8–27)
Bilirubin Total: 0.4 mg/dL (ref 0.0–1.2)
CALCIUM: 9.5 mg/dL (ref 8.6–10.2)
CO2: 24 mmol/L (ref 20–29)
Chloride: 101 mmol/L (ref 96–106)
Creatinine, Ser: 1.24 mg/dL (ref 0.76–1.27)
GFR, EST AFRICAN AMERICAN: 66 mL/min/{1.73_m2} (ref >59)
GFR, EST NON AFRICAN AMERICAN: 57 mL/min/{1.73_m2} — AB (ref >59)
GLOBULIN, TOTAL: 2.2 g/dL (ref 1.5–4.5)
Glucose: 102 mg/dL — ABNORMAL HIGH (ref 65–99)
Potassium: 4.1 mmol/L (ref 3.5–5.2)
Sodium: 139 mmol/L (ref 134–144)
TOTAL PROTEIN: 6.5 g/dL (ref 6.0–8.5)

## 2018-01-27 LAB — CBC WITH DIFFERENTIAL/PLATELET
BASOS: 1 %
Basophils Absolute: 0.1 10*3/uL (ref 0.0–0.2)
EOS (ABSOLUTE): 0.2 10*3/uL (ref 0.0–0.4)
Eos: 3 %
HEMOGLOBIN: 15.1 g/dL (ref 13.0–17.7)
Hematocrit: 44.9 % (ref 37.5–51.0)
IMMATURE GRANS (ABS): 0 10*3/uL (ref 0.0–0.1)
Immature Granulocytes: 0 %
LYMPHS: 33 %
Lymphocytes Absolute: 2.4 10*3/uL (ref 0.7–3.1)
MCH: 29.2 pg (ref 26.6–33.0)
MCHC: 33.6 g/dL (ref 31.5–35.7)
MCV: 87 fL (ref 79–97)
MONOCYTES: 11 %
Monocytes Absolute: 0.8 10*3/uL (ref 0.1–0.9)
NEUTROS ABS: 3.8 10*3/uL (ref 1.4–7.0)
Neutrophils: 52 %
Platelets: 342 10*3/uL (ref 150–450)
RBC: 5.17 x10E6/uL (ref 4.14–5.80)
RDW: 12.3 % (ref 12.3–15.4)
WBC: 7.2 10*3/uL (ref 3.4–10.8)

## 2018-01-27 LAB — VITAMIN D 25 HYDROXY (VIT D DEFICIENCY, FRACTURES): Vit D, 25-Hydroxy: 25.1 ng/mL — ABNORMAL LOW (ref 30.0–100.0)

## 2018-01-27 LAB — LIPID PANEL
CHOLESTEROL TOTAL: 198 mg/dL (ref 100–199)
Chol/HDL Ratio: 3.5 ratio (ref 0.0–5.0)
HDL: 57 mg/dL (ref >39)
LDL Calculated: 112 mg/dL — ABNORMAL HIGH (ref 0–99)
TRIGLYCERIDES: 145 mg/dL (ref 0–149)
VLDL CHOLESTEROL CAL: 29 mg/dL (ref 5–40)

## 2018-01-27 LAB — VITAMIN B12: Vitamin B-12: 333 pg/mL (ref 232–1245)

## 2018-01-27 LAB — T4, FREE: Free T4: 0.94 ng/dL (ref 0.82–1.77)

## 2018-01-27 LAB — HEMOGLOBIN A1C
Est. average glucose Bld gHb Est-mCnc: 117 mg/dL
Hgb A1c MFr Bld: 5.7 % — ABNORMAL HIGH (ref 4.8–5.6)

## 2018-01-27 LAB — TSH: TSH: 4.09 u[IU]/mL (ref 0.450–4.500)

## 2018-01-28 ENCOUNTER — Encounter: Payer: Self-pay | Admitting: *Deleted

## 2018-01-28 DIAGNOSIS — Z9842 Cataract extraction status, left eye: Secondary | ICD-10-CM | POA: Diagnosis not present

## 2018-01-28 DIAGNOSIS — H04123 Dry eye syndrome of bilateral lacrimal glands: Secondary | ICD-10-CM | POA: Diagnosis not present

## 2018-01-28 DIAGNOSIS — H02834 Dermatochalasis of left upper eyelid: Secondary | ICD-10-CM | POA: Diagnosis not present

## 2018-01-28 DIAGNOSIS — Z9841 Cataract extraction status, right eye: Secondary | ICD-10-CM | POA: Diagnosis not present

## 2018-01-28 DIAGNOSIS — Z961 Presence of intraocular lens: Secondary | ICD-10-CM | POA: Diagnosis not present

## 2018-01-28 DIAGNOSIS — H02831 Dermatochalasis of right upper eyelid: Secondary | ICD-10-CM | POA: Diagnosis not present

## 2018-02-02 ENCOUNTER — Encounter: Payer: Self-pay | Admitting: Family Medicine

## 2018-02-02 ENCOUNTER — Ambulatory Visit (INDEPENDENT_AMBULATORY_CARE_PROVIDER_SITE_OTHER): Payer: PPO | Admitting: Family Medicine

## 2018-02-02 VITALS — BP 130/84 | HR 65 | Ht 70.0 in | Wt 248.4 lb

## 2018-02-02 DIAGNOSIS — E538 Deficiency of other specified B group vitamins: Secondary | ICD-10-CM | POA: Diagnosis not present

## 2018-02-02 DIAGNOSIS — I10 Essential (primary) hypertension: Secondary | ICD-10-CM

## 2018-02-02 DIAGNOSIS — R7302 Impaired glucose tolerance (oral): Secondary | ICD-10-CM | POA: Diagnosis not present

## 2018-02-02 DIAGNOSIS — E782 Mixed hyperlipidemia: Secondary | ICD-10-CM

## 2018-02-02 DIAGNOSIS — N183 Chronic kidney disease, stage 3 (moderate): Secondary | ICD-10-CM

## 2018-02-02 DIAGNOSIS — N1832 Chronic kidney disease, stage 3b: Secondary | ICD-10-CM

## 2018-02-02 DIAGNOSIS — E559 Vitamin D deficiency, unspecified: Secondary | ICD-10-CM

## 2018-02-02 DIAGNOSIS — Z723 Lack of physical exercise: Secondary | ICD-10-CM | POA: Diagnosis not present

## 2018-02-02 DIAGNOSIS — E781 Pure hyperglyceridemia: Secondary | ICD-10-CM

## 2018-02-02 MED ORDER — VITAMIN D (ERGOCALCIFEROL) 1.25 MG (50000 UNIT) PO CAPS: 50000.0000 [IU] | ORAL_CAPSULE | ORAL | 10 refills | Status: DC

## 2018-02-02 MED ORDER — CALCIUM CARBONATE-VITAMIN D 600-400 MG-UNIT PO TABS: 1.0000 | ORAL_TABLET | Freq: Two times a day (BID) | ORAL | 11 refills | Status: DC

## 2018-02-02 NOTE — Progress Notes (Signed)
Assessment and plan:  1. Essential hypertension   2. Chronic kidney disease (CKD) stage G3b/A1, moderately decreased glomerular filtration rate (GFR) between 30-44 mL/min/1.73 square meter and albuminuria creatinine ratio less than 30 mg/g (HCC)   3. Mixed hyperlipidemia   4. Hypertriglyceridemia   5. Glucose intolerance (impaired glucose tolerance)   6. obesity (BMI 35.0-39.9) with comorbidity ([place])   7. Inactivity   8. Vitamin D insufficiency   9. h/o Low serum vitamin B12      HLD/Hypertriglyceridemia -Continue treatment as directed -Educated pt on ASCVD risk level and meaning for overall health -Discussed negative impact of elevated LDL on cholesterol levels and cardiovascular health -Discussed dietary and exercise changes that can improve cholesterol levels  -Educated pt on saturated, unsaturated and trans fats and decreasing intake of red meats  Vitamin D/Vitamin B12 -Prescribed Vitamin D once per week; see med list below -Encouraged pt to take OTC supplements it increase Vitamin B12 levels -Encouraged pt to also take multivitamin daily -Will recheck levels in 4-6 months -Discussed remedies to avoid nausea and upset stomach with multivitamins -Suggested chewable vitamins or eating with multivitamins  Prediabetes -Discussed meaning of A1C and impact of diabetes on health -Educated pt on positive dietary choices such as vegetables, fish, chicken and [place] as well as decreasing carbs and sugar intake -Discussed artificial sweeteners that will decrease impact on blood sugar   Kidneys -Recheck kidney function in 4-6 months -Recommended pt to increase their water intake to half of their body weight in ounces. -Discussed negative impact of NSAIDs and HTN on kidney function -Educated pt on function of kidneys for overall health and quality of life -Encouraged pt to consider Tylenol for joint pain to spare  kidney  HTN -BP well controlled with medication; continue medication as prescribed -Discussed impact of HTN on organs and cardiovascular health -  Lifestyle -Encouraged pt to increase speed walking to 45 to 60 min at least 4-5 days per week -Discussed positive impact of increasing daily exercise on HLD, HTN, mood, and energy levels   Education and routine counseling performed. Handouts provided.  Orders Placed This Encounter  Procedures  . Microalbumin / creatinine urine ratio    Meds ordered this encounter  Medications  . Vitamin D, Ergocalciferol, (DRISDOL) 50000 units CAPS capsule    Sig: Take 1 capsule (50,000 Units total) by mouth every 7 (seven) days.    Dispense:  12 capsule    Refill:  10  . Calcium Carbonate-Vitamin D 600-400 MG-UNIT tablet    Sig: Take 1 tablet by mouth 2 (two) times daily.    Dispense:  60 tablet    Refill:  11     Return for 4 months follow-up, 1wk prior BMP with GFR, vitamin D level.   Anticipatory guidance and routine counseling done re: condition, txmnt options and need for follow up. All questions of patient's were answered.   Gross side effects, risk and benefits, and alternatives of medications discussed with patient.  Patient is aware that all medications have potential side effects and we are unable to predict every sideeffect or drug-drug interaction that may occur.  Expresses verbal understanding and consents to current therapy plan and treatment regiment.  Please see AVS handed out to patient at the end of our visit for additional patient instructions/ counseling done pertaining to today's office visit.  Note: This document was prepared using Dragon voice recognition software and may include unintentional dictation errors.    This document  serves as a record of services personally performed by Mellody Dance, MD. It was created on her behalf by Georga Bora, a trained medical scribe. The creation of this record is based on the  scribe's personal observations and the provider's statements to them.   I have reviewed the above medical documentation for accuracy and completeness and I concur.  Mellody Dance 02/04/18 9:32 PM   ----------------------------------------------------------------------------------------------------------------------  Subjective:   CC:   [name] is a 72 y.o. male who presents to [place] at [facility] today for review and discussion of recent bloodwork that was done.  All recent blood work that we ordered was reviewed with patient today.  Patient was counseled on all abnormalities and we discussed dietary and lifestyle changes that could help those values (also medications when appropriate).  Extensive health counseling performed and all patient's concerns/ questions were addressed.   Lifestyle -Pt states he has been walking daily and has "lost two notches on his belt" -Says he has noticed a decrease in right knee swelling since he began walking more -States he hasn't been as active outside as usual because of the temperatures -Says he sometimes struggles with multivitamins due to nausea and upset stomach after taking them  Social History -Pt says he "occasionally drinks" but not regularly -Tobacco abuse history; quit in roughly 1984  Surgical History -Pt had right knee replaced in March 2018  Joint Pain -Pt takes celebrex for joint pain; states he had been taking NSAIDs through the [place] for years  Blood Panel 01/26/2018 Vitamin D -low at 25.1 -States he takes one vitamin D/calcium pill every day  Vitamin B12 -low normal at 333  Cholesterol -LDL 112 -HLD 57 -Triglycerides 145  The 10-year ASCVD risk score Mikey Bussing DC Jr., et al., 2013) is: 24.3%   Values used to calculate the score:     Age: 72 years     Sex: Male     Is Non-Hispanic African American: No     Diabetic: No     Tobacco smoker: No     Systolic Blood Pressure: 626 mmHg     Is BP  treated: Yes     HDL Cholesterol: 57 mg/dL     Total Cholesterol: 198 mg/dL   Prediabetes A1C at 5.7  Kidneys -low GFR at 57 -Ser, Creatinine 1.24, high normal -Bun 22, high normal      Wt Readings from Last 3 Encounters:  02/19/18 250 lb 6.4 oz (113.6 kg)  02/02/18 248 lb 6.4 oz (112.7 kg)  10/28/17 245 lb 6.4 oz (111.3 kg)   BP Readings from Last 3 Encounters:  02/19/18 124/74  02/02/18 130/84  10/28/17 125/81   Pulse Readings from Last 3 Encounters:  02/19/18 75  02/02/18 65  10/28/17 61   BMI Readings from Last 3 Encounters:  02/19/18 35.93 kg/m  02/02/18 35.64 kg/m  10/28/17 35.21 kg/m     Patient Care Team    Relationship Specialty Notifications Start End  Mellody Dance, DO PCP - General Family Medicine  10/28/17   Martin[place], Peter M, MD Consulting Physician Cardiology  10/28/17   Renette Butters, MD Attending Physician Orthopedic Surgery  10/28/17   Irene Shipper, MD Consulting Physician Gastroenterology  10/28/17   Myrlene Broker, MD Attending Physician Urology  10/28/17   Marilynne Halsted, MD Referring Physician Ophthalmology  10/28/17   Administration, [facility]    02/18/18     Full medical history updated and reviewed in the office  today  Patient Active Problem List   Diagnosis Date Noted  . Hyperlipidemia 08/25/2007    Priority: High  . Essential hypertension 08/25/2007    Priority: High  . Mood disorder ([place]) 08/25/2007    Priority: Medium  . GASTROESOPHAGEAL REFLUX DISEASE 08/25/2007    Priority: Medium  . Benign non-nodular prostatic hyperplasia with lower urinary tract symptoms 04/11/2014    Priority: Low  . 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    Priority: Low  . Hypertriglyceridemia 02/02/2018  . obesity (BMI 35.0-39.9) with comorbidity ([place]) 02/02/2018  . Glucose intolerance (impaired glucose tolerance) 02/02/2018  . Vitamin D insufficiency 02/02/2018  . h/o Low serum vitamin B12 02/02/2018  . Chronic  kidney disease (CKD) stage G3b/A1, moderately decreased glomerular filtration rate (GFR) between 30-44 mL/min/1.73 square meter and albuminuria creatinine ratio less than 30 mg/g (HCC) 02/02/2018  . Morbid obesity ([place]) 10/28/2017  . Inactivity 10/28/2017  . Family history of malignant neoplasm of prostate 06/27/2017  . Snores 12/03/2016  . Primary localized osteoarthritis of right knee 08/14/2016  . Primary localized osteoarthritis of left knee 04/10/2016  . History of radial keratotomy 02/05/2016  . Macular puckering, right eye 01/02/2016  . Nuclear sclerotic cataract of right eye 01/02/2016  . Pseudophakia of left eye 01/02/2016  . S/P lumbar fusion 10/24/2014  . Degenerative spondylolisthesis 09/28/2014  . Spinal stenosis of lumbar region 09/14/2014  . Midline low back pain without sciatica 07/18/2014  . DDD (degenerative disc disease), lumbosacral 07/18/2014  . Dyspnea 07/19/2013  . COLONIC POLYPS, ADENOMATOUS 08/25/2007  . EXTERNAL HEMORRHOIDS 08/25/2007  . ASTHMA 08/25/2007  . ESOPHAGITIS 08/25/2007  . GASTRITIS 08/25/2007  . DIVERTICULOSIS OF COLON 08/25/2007  . OSTEOARTHRITIS 08/25/2007  . Sleep apnea 08/25/2007  . PYROSIS 08/25/2007  . EPIGASTRIC PAIN 08/25/2007    Past Medical History:  Diagnosis Date  . Asthma    as a child  . Colon polyps   . Depression   . Dyspnea   . Epigastric pain   . Hard of hearing   . Headache    migraines  . History of bronchitis   . History of pneumonia   . Hyperlipidemia   . Hypertension   . Knee pain   . Nausea    Occasional  . Osteoarthritis   . Sigmoid diverticulosis   . Wears glasses     Past Surgical History:  Procedure Laterality Date  . APPENDECTOMY    . BACK SURGERY    . COLONOSCOPY  11/06/2004   "several"  . EYE SURGERY Left   . HERNIA REPAIR    . JOINT REPLACEMENT    . MASS EXCISION Right 01/26/2015   Procedure: REMOVAL OF RIGHT CHEST WALL MASS;  Surgeon: Jackolyn Confer, MD;  Location: [facility];  Service: General;  Laterality: Right;  . MULTIPLE TOOTH EXTRACTIONS    . POLYPECTOMY    . SHOULDER SURGERY Right 2000s   "severed muscle years beforee; broke collar bone; dr repaired both at the same time"  . TONSILLECTOMY    . TOTAL KNEE ARTHROPLASTY Left 04/10/2016   Procedure: LEFT TOTAL KNEE ARTHROPLASTY;  Surgeon: Ninetta Lights, MD;  Location: [facility];  Service: Orthopedics;  Laterality: Left;  LEFT TOTAL KNEE ARTHROPLASTY  . TOTAL KNEE ARTHROPLASTY Right 08/14/2016  . TOTAL KNEE ARTHROPLASTY Right 08/14/2016   Procedure: RIGHT TOTAL KNEE ARTHROPLASTY;  Surgeon: Ninetta Lights, MD;  Location: [place];  Service: Orthopedics;  Laterality: Right;    Social History  Tobacco Use  . Smoking status: Former Smoker    Packs/day: 20.00    Years: 0.50    Pack years: 10.00    Types: Cigarettes    Last attempt to quit: 04/29/1983    Years since quitting: 34.8  . Smokeless tobacco: Never Used  Substance Use Topics  . Alcohol use: Yes    Alcohol/week: 4.0 standard drinks    Types: 4 Standard drinks or equivalent per week    Family Hx: Family History  Problem Relation Age of Onset  . Heart disease Father   . Colon cancer Maternal Grandfather   . Heart disease Paternal Grandmother      Medications: Current Outpatient Medications  Medication Sig Dispense Refill  . aspirin EC 81 MG tablet Take 81 mg by mouth daily.    . celecoxib (CELEBREX) 200 MG capsule Take 1 capsule by mouth daily.    . Choline Fenofibrate (FENOFIBRIC ACID) 135 MG CPDR Take 135 mg by mouth daily.    Marland Kitchen CINNAMON PO Take 1 capsule by mouth 2 (two) times daily.     Marland Kitchen ezetimibe (ZETIA) 10 MG tablet Take 1 tablet (10 mg total) by mouth daily. (Patient taking differently: Take 10 mg by mouth at bedtime. ) 90 tablet 3  . Garlic 9678 MG CAPS Take 1,000 mg by mouth 2 (two) times a week.    . benazepril (LOTENSIN) 20 MG tablet Take 1.5 tablets (30 mg total) by mouth daily. 135 tablet 3  . Calcium Carbonate-Vitamin  D 600-400 MG-UNIT tablet Take 1 tablet by mouth 2 (two) times daily. 60 tablet 11  . Multiple Vitamin (MULTIVITAMIN) capsule Take by mouth.    . vitamin B-12 (CYANOCOBALAMIN) 100 MCG tablet Take by mouth.    . Vitamin D, Ergocalciferol, (DRISDOL) 50000 units CAPS capsule Take 1 capsule (50,000 Units total) by mouth every 7 (seven) days. 12 capsule 10   No current facility-administered medications for this visit.     Allergies:  Allergies  Allergen Reactions  . Amoxicillin Hives and Rash    Has patient had a PCN reaction causing immediate rash, facial/tongue/throat swelling, [name]: #  #  #  YES  #  #  #  Has patient had a PCN reaction causing SEVERE RASH INVOLVING MUCUS MEMBRANES or SKIN NECROSIS: #  #  #  YES  #  #  #  Has patient had a PCN reaction that required hospitalization No Has patient had a PCN reaction occurring within the last 10 years: #  #  #  YES  #  #  #  If all of the above answers are "NO", then may proceed with Cephalosporin use.   . Codeine Other (See Comments)    *  *  Pt does not have allergy to codeine but father had severe reaction to codeine so he never wants to be given to him  *  *  . Atorvastatin Other (See Comments)    Weakness in muscles  . Statins Other (See Comments)    Weakness in muscles  . Toprol Xl [Metoprolol Succinate] Other (See Comments)    Pt states is " like a govenor on a bus"      Review of Systems: General:   No F/C, wt loss Pulm:   No DIB, SOB, pleuritic chest pain Card:  No CP, palpitations Abd:  No n/v/d or pain Ext:  No inc edema from baseline  Objective:  Blood pressure 130/84, pulse 65, height 5\' 10"  (  1.778 m), weight 248 lb 6.4 oz (112.7 kg), SpO2 97 %. Body mass index is 35.64 kg/m. Gen:   Well NAD, A and O *3 HEENT:    [place]/AT, EOMI,  MMM Lungs:   Normal work of breathing. CTA B/L, no Wh, rhonchi Heart:   RRR, S1, S2 WNL's, no MRG Abd:   No gross distention Exts:    warm, pink,  Brisk  capillary refill, warm and well perfused.  Psych:    No HI/SI, judgement and insight good, Euthymic mood. Full Affect.   Recent Results (from the past 2160 hour(s))  Vitamin B12     Status: None   Collection Time: 01/26/18  8:58 AM  Result Value Ref Range   Vitamin B-12 333 232 - 1,245 pg/mL  T4, free     Status: None   Collection Time: 01/26/18  8:58 AM  Result Value Ref Range   Free T4 0.94 0.82 - 1.77 ng/dL  VITAMIN D 25 Hydroxy (Vit-D Deficiency, Fractures)     Status: Abnormal   Collection Time: 01/26/18  8:58 AM  Result Value Ref Range   Vit D, 25-Hydroxy 25.1 (L) 30.0 - 100.0 ng/mL    Comment: Vitamin D deficiency has been defined by the [place] and an Endocrine Society practice guideline as a level of serum 25-OH vitamin D less than 20 ng/mL (1,2). The Endocrine Society went on to further define vitamin D insufficiency as a level between 21 and 29 ng/mL (2). 1. IOM (Institute of Medicine). 2010. Dietary reference    intakes for calcium and D. [place]: The    [employer]. 2. Holick MF, Binkley [place], Bischoff-Ferrari HA, et al.    Evaluation, treatment, and prevention of vitamin D    deficiency: an Endocrine Society clinical practice    guideline. JCEM. 2011 Jul; 96(7):1911-30.   TSH     Status: None   Collection Time: 01/26/18  8:58 AM  Result Value Ref Range   TSH 4.090 0.450 - 4.500 uIU/mL  Lipid panel     Status: Abnormal   Collection Time: 01/26/18  8:58 AM  Result Value Ref Range   Cholesterol, Total 198 100 - 199 mg/dL   Triglycerides 145 0 - 149 mg/dL   HDL 57 >39 mg/dL   VLDL Cholesterol Cal 29 5 - 40 mg/dL   LDL Calculated 112 (H) 0 - 99 mg/dL   Chol/HDL Ratio 3.5 0.0 - 5.0 ratio    Comment:                                   T. Chol/HDL Ratio                                             Men  Women                               1/2 Avg.Risk  3.4    3.3                                   Avg.Risk  5.0    4.4  2X Avg.Risk  9.6    7.1                                3X Avg.Risk 23.4   11.0   Hemoglobin A1c     Status: Abnormal   Collection Time: 01/26/18  8:58 AM  Result Value Ref Range   Hgb A1c MFr Bld 5.7 (H) 4.8 - 5.6 %    Comment:          Prediabetes: 5.7 - 6.4          Diabetes: >6.4          Glycemic control for adults with diabetes: <7.0    Est. average glucose Bld gHb Est-mCnc 117 mg/dL  Comprehensive metabolic panel     Status: Abnormal   Collection Time: 01/26/18  8:58 AM  Result Value Ref Range   Glucose 102 (H) 65 - 99 mg/dL   BUN 22 8 - 27 mg/dL   Creatinine, Ser 1.24 0.76 - 1.27 mg/dL   GFR calc non Af Amer 57 (L) >59 mL/min/1.73   GFR calc Af Amer 66 >59 mL/min/1.73   BUN/Creatinine Ratio 18 10 - 24   Sodium 139 134 - 144 mmol/L   Potassium 4.1 3.5 - 5.2 mmol/L   Chloride 101 96 - 106 mmol/L   CO2 24 20 - 29 mmol/L   Calcium 9.5 8.6 - 10.2 mg/dL   Total Protein 6.5 6.0 - 8.5 g/dL   Albumin 4.3 3.5 - 4.8 g/dL   Globulin, Total 2.2 1.5 - 4.5 g/dL   Albumin/Globulin Ratio 2.0 1.2 - 2.2   Bilirubin Total 0.4 0.0 - 1.2 mg/dL   Alkaline Phosphatase 41 39 - 117 IU/L   AST 16 0 - 40 IU/L   ALT 16 0 - 44 IU/L  CBC with Differential/Platelet     Status: None   Collection Time: 01/26/18  8:58 AM  Result Value Ref Range   WBC 7.2 3.4 - 10.8 x10E3/uL   RBC 5.17 4.14 - 5.80 x10E6/uL   Hemoglobin 15.1 13.0 - 17.7 g/dL   Hematocrit 44.9 37.5 - 51.0 %   MCV 87 79 - 97 fL   MCH 29.2 26.6 - 33.0 pg   MCHC 33.6 31.5 - 35.7 g/dL   RDW 12.3 12.3 - 15.4 %   Platelets 342 150 - 450 x10E3/uL   Neutrophils 52 Not Estab. %   Lymphs 33 Not Estab. %   Monocytes 11 Not Estab. %   Eos 3 Not Estab. %   Basos 1 Not Estab. %   Neutrophils Absolute 3.8 1.4 - 7.0 x10E3/uL   Lymphocytes Absolute 2.4 0.7 - 3.1 x10E3/uL   Monocytes Absolute 0.8 0.1 - 0.9 x10E3/uL   EOS (ABSOLUTE) 0.2 0.0 - 0.4 x10E3/uL   Basophils Absolute 0.1 0.0 - 0.2 x10E3/uL   Immature Granulocytes 0 Not  Estab. %   Immature Grans (Abs) 0.0 0.0 - 0.1 x10E3/uL  Microalbumin / creatinine urine ratio     Status: None   Collection Time: 02/02/18 10:52 AM  Result Value Ref Range   Creatinine, Urine 38.1 Not Estab. mg/dL   Microalbumin, Urine <3.0 Not Estab. ug/mL    Comment: **Verified by repeat analysis**   Microalb/Creat Ratio <7.9 0.0 - 30.0 mg/g creat    Comment:                      Normal:  0.0 -  30.0                      Albuminuria:          31.0 - 300.0                      Clinical albuminuria:       >300.0

## 2018-02-02 NOTE — Patient Instructions (Addendum)
Please ensure you're taking Vitamin D and 1200mg  of calcium per day.  Please make sure you are taking a Centrum Silver once a day vitamin gummy bear which will make sure you are B12 levels stay in the normal range.  Please realize, EXERCISE IS MEDICINE!  -  American Heart Association [facility]) guidelines for exercise : If you are in good health, without any medical conditions, you should engage in 150 minutes of moderate intensity aerobic activity per week.  This means you should be huffing and puffing throughout your workout.   Engaging in regular exercise will improve brain function and memory, as well as improve mood, boost immune system and help with weight management.  As well as the other, more well-known effects of exercise such as decreasing blood sugar levels, decreasing blood pressure,  and decreasing bad cholesterol levels/ increasing good cholesterol levels.     -  The AHA strongly endorses consumption of a diet that contains a variety of foods from all the food categories with an emphasis on fruits and vegetables; fat-free and low-fat dairy products; cereal and grain products; legumes and nuts; and fish, poultry, and/or extra lean meats.    Excessive food intake, especially of foods high in saturated and trans fats, sugar, and salt, should be avoided.    Adequate water intake of roughly 1/2 of your weight in pounds, should equal the ounces of water per day you should drink.  So for instance, if you're 200 pounds, that would be 100 ounces of water per day.        Mediterranean Diet  Why follow it? Research shows  Those who follow the Mediterranean diet have a reduced risk of heart disease   The diet is associated with a reduced incidence of Parkinson's and Alzheimer's diseases  People following the diet may have longer life expectancies and lower rates of chronic diseases   The Dietary Guidelines for Americans recommends the Mediterranean diet as an eating plan to promote health and  prevent disease  What Is the Mediterranean Diet?   Healthy eating plan based on typical foods and recipes of Mediterranean-style cooking  The diet is primarily a plant based diet; these foods should make up a majority of meals   Starches - Plant based foods should make up a majority of meals - They are an important sources of vitamins, minerals, energy, antioxidants, and fiber - Choose whole grains, foods high in fiber and minimally processed items  - Typical grain sources include wheat, oats, barley, corn, brown rice, bulgar, farro, millet, polenta, couscous  - Various types of beans include chickpeas, lentils, fava beans, black beans, white beans   Fruits  Veggies - Large quantities of antioxidant rich fruits & veggies; 6 or more servings  - Vegetables can be eaten raw or lightly drizzled with oil and cooked  - Vegetables common to the traditional Mediterranean Diet include: artichokes, arugula, beets, broccoli, brussel sprouts, cabbage, carrots, celery, collard greens, cucumbers, eggplant, kale, leeks, lemons, lettuce, mushrooms, okra, onions, peas, peppers, potatoes, pumpkin, radishes, rutabaga, shallots, spinach, sweet potatoes, turnips, zucchini - Fruits common to the Mediterranean Diet include: apples, apricots, avocados, cherries, clementines, dates, figs, grapefruits, grapes, melons, nectarines, oranges, peaches, pears, pomegranates, strawberries, tangerines  Fats - Replace butter and margarine with healthy oils, such as olive oil, canola oil, and tahini  - Limit nuts to no more than a handful a day  - Nuts include walnuts, almonds, pecans, pistachios, pine nuts  - Limit or avoid candied, honey  roasted or heavily salted nuts - Olives are central to the Mediterranean diet - can be eaten whole or used in a variety of dishes   Meats Protein - Limiting red meat: no more than a few times a month - When eating red meat: choose lean cuts and keep the portion to the size of deck of cards -  Eggs: approx. 0 to 4 times a week  - Fish and lean poultry: at least 2 a week  - Healthy protein sources include, chicken, [place], lean beef, lamb - Increase intake of seafood such as tuna, salmon, trout, mackerel, shrimp, scallops - Avoid or limit high fat processed meats such as sausage and bacon  Dairy - Include moderate amounts of low fat dairy products  - Focus on healthy dairy such as fat free yogurt, skim milk, low or reduced fat cheese - Limit dairy products higher in fat such as whole or 2% milk, cheese, ice cream  Alcohol - Moderate amounts of red wine is ok  - No more than 5 oz daily for women (all ages) and men older than age 31  - No more than 10 oz of wine daily for men younger than 74  Other - Limit sweets and other desserts  - Use herbs and spices instead of salt to flavor foods  - Herbs and spices common to the traditional Mediterranean Diet include: basil, bay leaves, chives, cloves, cumin, fennel, garlic, lavender, marjoram, mint, oregano, parsley, pepper, rosemary, sage, savory, sumac, tarragon, thyme   Its not just a diet, its a lifestyle:   The Mediterranean diet includes lifestyle factors typical of those in the region   Foods, drinks and meals are best eaten with others and savored  Daily physical activity is important for overall good health  This could be strenuous exercise like running and aerobics  This could also be more leisurely activities such as walking, housework, yard-work, or taking the stairs  Moderation is the key; a balanced and healthy diet accommodates most foods and drinks  Consider portion sizes and frequency of consumption of certain foods   Meal Ideas & Options:   Breakfast:  o Whole wheat toast or whole wheat English muffins with peanut butter & hard boiled egg o Steel cut oats topped with apples & cinnamon and skim milk  o Fresh fruit: banana, strawberries, melon, berries, peaches  o Smoothies: strawberries, bananas, greek yogurt,  peanut butter o Low fat greek yogurt with blueberries and granola  o Egg white omelet with spinach and mushrooms o Breakfast couscous: whole wheat couscous, apricots, skim milk, cranberries   Sandwiches:  o Hummus and grilled vegetables (peppers, zucchini, squash) on whole wheat bread   o Grilled chicken on whole wheat pita with lettuce, tomatoes, cucumbers or tzatziki  o Tuna salad on whole wheat bread: tuna salad made with greek yogurt, olives, red peppers, capers, green onions o Garlic rosemary lamb pita: lamb sauted with garlic, rosemary, salt & pepper; add lettuce, cucumber, greek yogurt to pita - flavor with lemon juice and black pepper   Seafood:  o Mediterranean grilled salmon, seasoned with garlic, basil, parsley, lemon juice and black pepper o Shrimp, lemon, and spinach whole-grain pasta salad made with low fat greek yogurt  o Seared scallops with lemon orzo  o Seared tuna steaks seasoned salt, pepper, coriander topped with tomato mixture of olives, tomatoes, olive oil, minced garlic, parsley, green onions and cappers   Meats:  o Herbed greek chicken salad with kalamata olives, cucumber, feta  o Red bell peppers stuffed with spinach, bulgur, lean ground beef (or lentils) & topped with feta   o Kebabs: skewers of chicken, tomatoes, onions, zucchini, squash  o [place] burgers: made with red onions, mint, dill, lemon juice, feta cheese topped with roasted red peppers  Vegetarian o Cucumber salad: cucumbers, artichoke hearts, celery, red onion, feta cheese, tossed in olive oil & lemon juice  o Hummus and whole grain pita points with a greek salad (lettuce, tomato, feta, olives, cucumbers, red onion) o Lentil soup with celery, carrots made with vegetable broth, garlic, salt and pepper  o Tabouli salad: parsley, bulgur, mint, scallions, cucumbers, tomato, radishes, lemon juice, olive oil, salt and pepper.   Guidelines for a Low Cholesterol, Low Saturated Fat Diet   Fats - Limit  total intake of fats and oils. - Avoid butter, stick margarine, shortening, lard, palm and coconut oils. - Limit mayonnaise, salad dressings, gravies and sauces, unless they are homemade with low-fat ingredients. - Limit chocolate. - Choose low-fat and nonfat products, such as low-fat mayonnaise, low-fat or non-hydrogenated peanut butter, low-fat or fat-free salad dressings and nonfat gravy. - Use vegetable oil, such as canola or olive oil. - Look for margarine that does not contain trans fatty acids. - Use nuts in moderate amounts. - Read ingredient labels carefully to determine both amount and type of fat present in foods. Limit saturated and trans fats! - Avoid high-fat processed and convenience foods.  Meats and Meat Alternatives - Choose fish, chicken, [place] and lean meats. - Use dried beans, peas, lentils and tofu. - Limit egg yolks to three to four per week. - If you eat red meat, limit to no more than three servings per week and choose loin or round cuts. - Avoid fatty meats, such as bacon, sausage, franks, luncheon meats and ribs. - Avoid all organ meats, including liver.  Dairy - Choose nonfat or low-fat milk, yogurt and cottage cheese. - Most cheeses are high in fat. Choose cheeses made from non-fat milk, such as mozzarella and ricotta cheese. - Choose light or fat-free cream cheese and sour cream. - Avoid cream and sauces made with cream.  Fruits and Vegetables - Eat a wide variety of fruits and vegetables. - Use lemon juice, vinegar or "mist" olive oil on vegetables. - Avoid adding sauces, fat or oil to vegetables.  Breads, Cereals and Grains - Choose whole-grain breads, cereals, pastas and rice. - Avoid high-fat snack foods, such as granola, cookies, pies, pastries, doughnuts and croissants.  Cooking Tips - Avoid deep fried foods. - Trim visible fat off meats and remove skin from poultry before cooking. - Bake, broil, boil, poach or roast poultry, fish and lean  meats. - Drain and discard fat that drains out of meat as you cook it. - Add little or no fat to foods. - Use vegetable oil sprays to grease pans for cooking or baking. - Steam vegetables. - Use herbs or no-oil marinades to flavor foods.

## 2018-02-03 ENCOUNTER — Encounter: Payer: Self-pay | Admitting: Family Medicine

## 2018-02-03 LAB — MICROALBUMIN / CREATININE URINE RATIO
CREATININE, UR: 38.1 mg/dL
Microalb/Creat Ratio: <7.9 mg/g creat (ref 0.0–30.0)

## 2018-02-12 DIAGNOSIS — Z9842 Cataract extraction status, left eye: Secondary | ICD-10-CM | POA: Diagnosis not present

## 2018-02-12 DIAGNOSIS — H04123 Dry eye syndrome of bilateral lacrimal glands: Secondary | ICD-10-CM | POA: Diagnosis not present

## 2018-02-12 DIAGNOSIS — H02834 Dermatochalasis of left upper eyelid: Secondary | ICD-10-CM | POA: Diagnosis not present

## 2018-02-12 DIAGNOSIS — H02831 Dermatochalasis of right upper eyelid: Secondary | ICD-10-CM | POA: Diagnosis not present

## 2018-02-12 DIAGNOSIS — Z961 Presence of intraocular lens: Secondary | ICD-10-CM | POA: Diagnosis not present

## 2018-02-15 NOTE — Progress Notes (Signed)
[name] Date of Birth: 04/23/1946 Medical Record #[id number]  History of Present Illness: [name] is seen for yearly follow up of HTN and hyperlipidemia. He has multiple drug intolerances. He is doing well on benazepril now. Blood pressure was elevated. He was taking some type of diet pill and his blood pressure shot up. He increased his benazepril to 30 mg daily and after a month BP has improved. He notes occ. Irregular pulse on his BP monitor.  He had a normal ETT in March 2015. He did undergo L4-5 back surgery in April 2017.  In October 2017 he underwent left TKR and in March 2018 he had a right TKR.  He is now followed at the [facility] for primary care. When last seen he had atypical chest pain and Myoview was done. It was low risk.  He is still concerned about his weight. Has less tolerance when working in the hot sun. Not exercising regularly.   Current Outpatient Medications on File Prior to Visit  Medication Sig Dispense Refill  . aspirin EC 81 MG tablet Take 81 mg by mouth daily.    . benazepril (LOTENSIN) 20 MG tablet TAKE 1 TABLET BY MOUTH ONCE DAILY 90 tablet 0  . Calcium Carbonate-Vitamin D 600-400 MG-UNIT tablet Take 1 tablet by mouth 2 (two) times daily. 60 tablet 11  . celecoxib (CELEBREX) 200 MG capsule Take 1 capsule by mouth daily.    . Choline Fenofibrate (FENOFIBRIC ACID) 135 MG CPDR Take 135 mg by mouth daily.    Marland Kitchen CINNAMON PO Take 1 capsule by mouth 2 (two) times daily.     Marland Kitchen ezetimibe (ZETIA) 10 MG tablet Take 1 tablet (10 mg total) by mouth daily. (Patient taking differently: Take 10 mg by mouth at bedtime. ) 90 tablet 3  . Garlic 2119 MG CAPS Take 1,000 mg by mouth 2 (two) times a week.    . Vitamin D, Ergocalciferol, (DRISDOL) 50000 units CAPS capsule Take 1 capsule (50,000 Units total) by mouth every 7 (seven) days. 12 capsule 10  . Multiple Vitamin (MULTIVITAMIN) capsule Take by mouth.    . vitamin B-12 (CYANOCOBALAMIN) 100 MCG tablet Take by mouth.      No current facility-administered medications on file prior to visit.     Allergies  Allergen Reactions  . Amoxicillin Hives and Rash    Has patient had a PCN reaction causing immediate rash, facial/tongue/throat swelling, [name]: #  #  #  YES  #  #  #  Has patient had a PCN reaction causing SEVERE RASH INVOLVING MUCUS MEMBRANES or SKIN NECROSIS: #  #  #  YES  #  #  #  Has patient had a PCN reaction that required hospitalization No Has patient had a PCN reaction occurring within the last 10 years: #  #  #  YES  #  #  #  If all of the above answers are "NO", then may proceed with Cephalosporin use.   . Codeine Other (See Comments)    *  *  Pt does not have allergy to codeine but father had severe reaction to codeine so he never wants to be given to him  *  *  . Atorvastatin Other (See Comments)    Weakness in muscles  . Statins Other (See Comments)    Weakness in muscles  . Toprol Xl [Metoprolol Succinate] Other (See Comments)    Pt states is " like a govenor on a  bus"     Past Medical History:  Diagnosis Date  . Asthma    as a child  . Colon polyps   . Depression   . Dyspnea   . Epigastric pain   . Hard of hearing   . Headache    migraines  . History of bronchitis   . History of pneumonia   . Hyperlipidemia   . Hypertension   . Knee pain   . Nausea    Occasional  . Osteoarthritis   . Sigmoid diverticulosis   . Wears glasses     Past Surgical History:  Procedure Laterality Date  . APPENDECTOMY    . BACK SURGERY    . COLONOSCOPY  11/06/2004   "several"  . EYE SURGERY Left   . HERNIA REPAIR    . JOINT REPLACEMENT    . MASS EXCISION Right 01/26/2015   Procedure: REMOVAL OF RIGHT CHEST WALL MASS;  Surgeon: Jackolyn Confer, MD;  Location: [place];  Service: General;  Laterality: Right;  . MULTIPLE TOOTH EXTRACTIONS    . POLYPECTOMY    . SHOULDER SURGERY Right 2000s   "severed muscle years beforee; broke collar  bone; dr repaired both at the same time"  . TONSILLECTOMY    . TOTAL KNEE ARTHROPLASTY Left 04/10/2016   Procedure: LEFT TOTAL KNEE ARTHROPLASTY;  Surgeon: Ninetta Lights, MD;  Location: [place];  Service: Orthopedics;  Laterality: Left;  LEFT TOTAL KNEE ARTHROPLASTY  . TOTAL KNEE ARTHROPLASTY Right 08/14/2016  . TOTAL KNEE ARTHROPLASTY Right 08/14/2016   Procedure: RIGHT TOTAL KNEE ARTHROPLASTY;  Surgeon: Ninetta Lights, MD;  Location: [place];  Service: Orthopedics;  Laterality: Right;    Social History   Tobacco Use  Smoking Status Former Smoker  . Packs/day: 20.00  . Years: 0.50  . Pack years: 10.00  . Types: Cigarettes  . Last attempt to quit: 04/29/1983  . Years since quitting: 34.8  Smokeless Tobacco Never Used    Social History   Substance and Sexual Activity  Alcohol Use Yes  . Alcohol/week: 4.0 standard drinks  . Types: 4 Standard drinks or equivalent per week    Family History  Problem Relation Age of Onset  . Heart disease Father   . Colon cancer Maternal Grandfather   . Heart disease Paternal Grandmother     Review of Systems: As noted in HPI.  All other systems were reviewed and are negative.  Physical Exam: BP 124/74 (BP Location: Left Arm, Patient Position: Sitting)   Pulse 75   Ht 5\' 10"  (1.778 m)   Wt 250 lb 6.4 oz (113.6 kg)   BMI 35.93 kg/m  GENERAL:  Well appearing WM in NAD. obese HEENT:  PERRL, EOMI, sclera are clear. Oropharynx is clear. NECK:  No jugular venous distention, carotid upstroke brisk and symmetric, no bruits, no thyromegaly or adenopathy LUNGS:  Clear to auscultation bilaterally CHEST:  Unremarkable HEART:  RRR,  PMI not displaced or sustained,S1 and S2 within normal limits, no S3, no S4: no clicks, no rubs, no murmurs ABD:  Soft, nontender. BS +, no masses or bruits. No hepatomegaly, no splenomegaly EXT:  2 + pulses throughout, no edema, no cyanosis no clubbing SKIN:  Warm and dry.  No rashes NEURO:  Alert and oriented x 3.  Cranial nerves II through XII intact. PSYCH:  Cognitively intact    LABORATORY DATA: Ecg shows normal sinus rhythm with occ. PACs.  Otherwise normal.   I have personally reviewed and interpreted  this study.  Lab Results  Component Value Date   WBC 7.2 01/26/2018   HGB 15.1 01/26/2018   HCT 44.9 01/26/2018   PLT 342 01/26/2018   GLUCOSE 102 (H) 01/26/2018   CHOL 198 01/26/2018   TRIG 145 01/26/2018   HDL 57 01/26/2018   LDLCALC 112 (H) 01/26/2018   ALT 16 01/26/2018   AST 16 01/26/2018   NA 139 01/26/2018   K 4.1 01/26/2018   CL 101 01/26/2018   CREATININE 1.24 01/26/2018   BUN 22 01/26/2018   CO2 24 01/26/2018   TSH 4.090 01/26/2018   INR 1.04 03/29/2016   HGBA1C 5.7 (H) 01/26/2018    Labs reviewed from the [place] in 09/26/15: CBC, CMET, TSH all normal. A1c 5.6%. Cholesterol 226, trig 113, HDL 76, LDL 122.   Myoview 03/26/17: Study Highlights     The left ventricular ejection fraction is normal (55-65%).  Nuclear stress EF: 61%.  There was no ST segment deviation noted during stress.  Defect 1: There is a small defect of moderate severity present in the apex location.  This is a low risk study.   Low risk stress nuclear study with apical thinning but no ischemia; EF 61 with normal wall motion.      Assessment / Plan: 1. Hypertension. improved on increased dose of  benazepril. Avoid diet pills.   2. Hyperlipidemia.  Intolerant to statins in the past. Continue Zetia and fenofibrate 160 mg daily. continue fish oil.   3. Atypical chest pain but with  risk factors. Myoview in October was low risk. Normal EF.   4. Obesity. Encouraged increased aerobic activity and Carbohydrate restriction.  5. PACs. Benign.  I will follow up in 6 months.

## 2018-02-18 ENCOUNTER — Encounter: Payer: Self-pay | Admitting: Family Medicine

## 2018-02-19 ENCOUNTER — Encounter

## 2018-02-19 ENCOUNTER — Other Ambulatory Visit: Payer: Self-pay

## 2018-02-19 ENCOUNTER — Encounter: Payer: Self-pay | Admitting: Cardiology

## 2018-02-19 ENCOUNTER — Ambulatory Visit: Payer: PPO | Admitting: Cardiology

## 2018-02-19 DIAGNOSIS — I10 Essential (primary) hypertension: Secondary | ICD-10-CM | POA: Diagnosis not present

## 2018-02-19 DIAGNOSIS — E782 Mixed hyperlipidemia: Secondary | ICD-10-CM | POA: Diagnosis not present

## 2018-02-19 DIAGNOSIS — R079 Chest pain, unspecified: Secondary | ICD-10-CM | POA: Diagnosis not present

## 2018-02-19 MED ORDER — BENAZEPRIL HCL 20 MG PO TABS: 30.0000 mg | ORAL_TABLET | Freq: Every day | ORAL | 0 refills | Status: DC

## 2018-02-19 MED ORDER — BENAZEPRIL HCL 20 MG PO TABS: 30.0000 mg | ORAL_TABLET | Freq: Every day | ORAL | 3 refills | Status: DC

## 2018-02-19 NOTE — Patient Instructions (Signed)
Continue your current therapy  I will see you in 6 months.   

## 2018-02-27 ENCOUNTER — Other Ambulatory Visit: Payer: Self-pay

## 2018-02-27 DIAGNOSIS — R079 Chest pain, unspecified: Secondary | ICD-10-CM

## 2018-02-27 DIAGNOSIS — I10 Essential (primary) hypertension: Secondary | ICD-10-CM

## 2018-02-27 DIAGNOSIS — E782 Mixed hyperlipidemia: Secondary | ICD-10-CM

## 2018-02-27 MED ORDER — BENAZEPRIL HCL 20 MG PO TABS: 30.0000 mg | ORAL_TABLET | Freq: Every day | ORAL | 3 refills | Status: DC

## 2018-03-16 IMAGING — NM NM MISC PROCEDURE
9 series · 54 of 54 positions shown · non-contrast
Comparison: none

[Series 1: wbr rest · 6.40mm/px · 6 of 64 frames shown]
[frame 6/64]
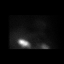
[frame 16/64]
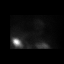
[frame 27/64]
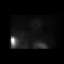
[frame 38/64]
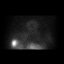
[frame 48/64]
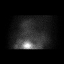
[frame 59/64]
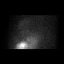

[Series 1: wbr_r-proj_st wbr rest · 6.40mm/px · 6 of 64 frames shown]
[frame 6/64]
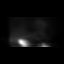
[frame 16/64]
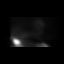
[frame 27/64]
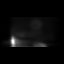
[frame 38/64]
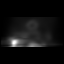
[frame 48/64]
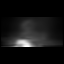
[frame 59/64]
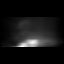

[Series 1: rest sax · 6.4mm · 6.40mm/px · 6 of 23 frames shown]
[frame 2/23]
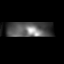
[frame 6/23]
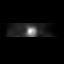
[frame 10/23]
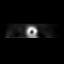
[frame 14/23]
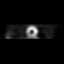
[frame 18/23]
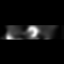
[frame 22/23]
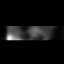

[Series 2: wbr stress-gsp · 6.40mm/px · 6 of 512 frames shown]
[frame 43/512]
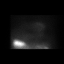
[frame 128/512]
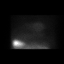
[frame 214/512]
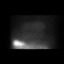
[frame 299/512]
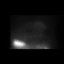
[frame 384/512]
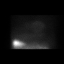
[frame 470/512]
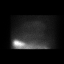

[Series 2: stress sax gs · 6.4mm · 6.40mm/px · 6 of 184 frames shown]
[frame 16/184]
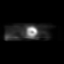
[frame 46/184]
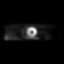
[frame 77/184]
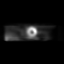
[frame 108/184]
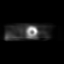
[frame 138/184]
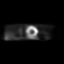
[frame 169/184]
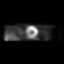

[Series 2: wbr_s-proj_st wbr stress-gsp · 6.40mm/px · 6 of 512 frames shown]
[frame 43/512]
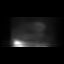
[frame 128/512]
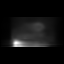
[frame 214/512]
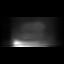
[frame 299/512]
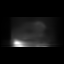
[frame 384/512]
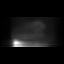
[frame 470/512]
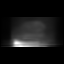

[Series 2: stress sax · 6.4mm · 6.40mm/px · 6 of 23 frames shown]
[frame 2/23]
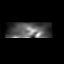
[frame 6/23]
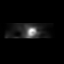
[frame 10/23]
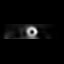
[frame 14/23]
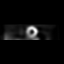
[frame 18/23]
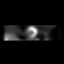
[frame 22/23]
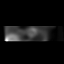

[Series 3: wbr_s-proj_st wbr stress-sum-em · 6.40mm/px · 6 of 64 frames shown]
[frame 6/64]
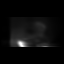
[frame 16/64]
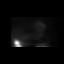
[frame 27/64]
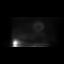
[frame 38/64]
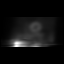
[frame 48/64]
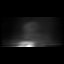
[frame 59/64]
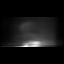

[Series 3: wbr stress-sum-em · 6.40mm/px · 6 of 64 frames shown]
[frame 6/64]
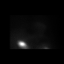
[frame 16/64]
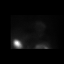
[frame 27/64]
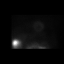
[frame 38/64]
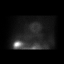
[frame 48/64]
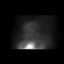
[frame 59/64]
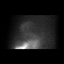

[54 of 54 positions shown; findings below may reference images not displayed]

Canned report from images found in remote index.

Refer to host system for actual result text.

## 2018-04-06 DIAGNOSIS — Z6835 Body mass index (BMI) 35.0-35.9, adult: Secondary | ICD-10-CM | POA: Diagnosis not present

## 2018-04-06 DIAGNOSIS — H02831 Dermatochalasis of right upper eyelid: Secondary | ICD-10-CM | POA: Diagnosis not present

## 2018-04-06 DIAGNOSIS — J309 Allergic rhinitis, unspecified: Secondary | ICD-10-CM | POA: Diagnosis not present

## 2018-04-06 DIAGNOSIS — I10 Essential (primary) hypertension: Secondary | ICD-10-CM | POA: Diagnosis not present

## 2018-04-06 DIAGNOSIS — E785 Hyperlipidemia, unspecified: Secondary | ICD-10-CM | POA: Diagnosis not present

## 2018-04-06 DIAGNOSIS — Z87891 Personal history of nicotine dependence: Secondary | ICD-10-CM | POA: Diagnosis not present

## 2018-04-06 DIAGNOSIS — H02834 Dermatochalasis of left upper eyelid: Secondary | ICD-10-CM | POA: Diagnosis not present

## 2018-04-06 DIAGNOSIS — E669 Obesity, unspecified: Secondary | ICD-10-CM | POA: Diagnosis not present

## 2018-04-14 DIAGNOSIS — H02834 Dermatochalasis of left upper eyelid: Secondary | ICD-10-CM | POA: Diagnosis not present

## 2018-04-14 DIAGNOSIS — H02831 Dermatochalasis of right upper eyelid: Secondary | ICD-10-CM | POA: Diagnosis not present

## 2018-05-04 DIAGNOSIS — M170 Bilateral primary osteoarthritis of knee: Secondary | ICD-10-CM | POA: Diagnosis not present

## 2018-05-22 DIAGNOSIS — Z9889 Other specified postprocedural states: Secondary | ICD-10-CM | POA: Diagnosis not present

## 2018-05-22 DIAGNOSIS — H02831 Dermatochalasis of right upper eyelid: Secondary | ICD-10-CM | POA: Diagnosis not present

## 2018-05-22 DIAGNOSIS — H02834 Dermatochalasis of left upper eyelid: Secondary | ICD-10-CM | POA: Diagnosis not present

## 2018-06-22 ENCOUNTER — Other Ambulatory Visit: Payer: Self-pay

## 2018-06-22 ENCOUNTER — Other Ambulatory Visit: Payer: PPO

## 2018-06-22 DIAGNOSIS — I10 Essential (primary) hypertension: Secondary | ICD-10-CM

## 2018-06-22 DIAGNOSIS — E559 Vitamin D deficiency, unspecified: Secondary | ICD-10-CM

## 2018-06-22 NOTE — Progress Notes (Signed)
Orders only

## 2018-06-24 ENCOUNTER — Encounter: Payer: Self-pay | Admitting: Family Medicine

## 2018-06-24 ENCOUNTER — Ambulatory Visit (INDEPENDENT_AMBULATORY_CARE_PROVIDER_SITE_OTHER): Payer: PPO | Admitting: Family Medicine

## 2018-06-24 VITALS — BP 137/82 | HR 87 | Ht 70.0 in | Wt 251.9 lb

## 2018-06-24 DIAGNOSIS — R251 Tremor, unspecified: Secondary | ICD-10-CM | POA: Diagnosis not present

## 2018-06-24 DIAGNOSIS — N1832 Chronic kidney disease, stage 3b: Secondary | ICD-10-CM

## 2018-06-24 DIAGNOSIS — R413 Other amnesia: Secondary | ICD-10-CM

## 2018-06-24 DIAGNOSIS — N183 Chronic kidney disease, stage 3 (moderate): Secondary | ICD-10-CM | POA: Diagnosis not present

## 2018-06-24 DIAGNOSIS — Z711 Person with feared health complaint in whom no diagnosis is made: Secondary | ICD-10-CM

## 2018-06-24 DIAGNOSIS — R7302 Impaired glucose tolerance (oral): Secondary | ICD-10-CM | POA: Diagnosis not present

## 2018-06-24 DIAGNOSIS — E559 Vitamin D deficiency, unspecified: Secondary | ICD-10-CM

## 2018-06-24 DIAGNOSIS — J3489 Other specified disorders of nose and nasal sinuses: Secondary | ICD-10-CM | POA: Diagnosis not present

## 2018-06-24 DIAGNOSIS — I10 Essential (primary) hypertension: Secondary | ICD-10-CM | POA: Diagnosis not present

## 2018-06-24 NOTE — Progress Notes (Signed)
Impression and Recommendations:    1. Essential hypertension   2. Chronic kidney disease (CKD) stage G3b/A1, moderately decreased glomerular filtration rate (GFR) between 30-44 mL/min/1.73 square meter and albuminuria creatinine ratio less than 30 mg/g (HCC)   3. Glucose intolerance (impaired glucose tolerance)   4. Vitamin D insufficiency   5. obesity (BMI 35.0-39.9) with comorbidity ([place])   6. Memory difficulties   7. Tremors of nervous system   8. Feared complaint without diagnosis- believes he has parkinsons   9. Thick nasal mucus; concerns about mucus/ phlegm stuck in throat     - Patient had recent lab work obtained.  Brenas working today to bring records to clinic.  Per [employer], patient's lab work was marked as lost.  - Discussed need for lab work to be re-drawn today for evaluation of CKD, GFR, A1c, & Vitamin D.  1. Pt Desires Follow-Up - Feeling of Excessive Phlegm or "Flap" in Throat - Patient desires referral to specialist and asks for referral today regarding a feeling "like excessive phlegm," "like a flap that's over my airway," worsening at night.  - Referral placed to ENT today.  - Will continue to monitor.  - Advised the patient to begin using AYR or Neilmed sinus rinses BID followed by flonase BID (one spray to each nostril).  Advised that the patient may also incorporate allegra or claritin PRN.   - Encouraged patient to drink warm liquids to help thin out phlegm.  - Encouraged patient to drink adequate amounts of water.  2. Pt Desires Follow-Up - Pt Concerns of Tremors, Parkinson's, Memory Difficulties - Patient desires referral to Neurology for evaluation of reported concerns about tremors and memory.  - Referral to Neurology placed today.  - Discussed the normal effects & changes of aging with patient today.  All questions were answered. - Will defer further management and education to neurologist.  - Will continue to monitor.  3. Essential  Hypertension - BP remains stable at this time. - Continue treatment plan as established.  See med list below. - Patient tolerating meds well without complication.  Denies S-E  Lifestyle changes such as dash diet and engaging in a regular exercise program discussed with patient.  Educational handouts provided.  Ambulatory BP monitoring encouraged. Keep log and bring in next OV.   Education and routine counseling performed. Handouts provided.  4. BMI Counseling - Body mass index is 36.14 kg/m. Explained to patient what BMI refers to, and what it means medically.    Told patient to think about it as a "medical risk stratification measurement" and how increasing BMI is associated with increasing risk/ or worsening state of various diseases such as hypertension, hyperlipidemia, diabetes, premature OA, depression etc.  American Heart Association guidelines for healthy diet, basically Mediterranean diet, and exercise guidelines of 30 minutes 5 days per week or more discussed in detail.  Health counseling performed.  All questions answered.  5. Lifestyle & Preventative Health Maintenance - Advised patient to continue working toward exercising to improve overall mental, physical, and emotional health.    - Reviewed the "spokes of the wheel" of mood and health management.  Stressed the importance of ongoing prudent habits, including regular exercise, appropriate sleep hygiene, healthful dietary habits, and prayer/meditation to relax.  - Encouraged patient to engage in daily physical activity, especially a formal exercise routine.  Recommended that the patient eventually strive for at least 150 minutes of moderate cardiovascular activity per week according to guidelines established by the  AHA.   - Healthy dietary habits encouraged, including low-carb, and high amounts of lean protein in diet.   - Patient should also consume adequate amounts of water.  Pt was interviewed and evaluated by me in the  clinic today for 32.5+ minutes, with over 50% time spent in face to face counseling of patients various medical conditions, treatment plans of those medical conditions including medicine management and lifestyle modification, strategies to improve health and well being; and in coordination of care. SEE ABOVE TREATMENT PLAN FOR DETAILS    Orders Placed This Encounter  Procedures  . VITAMIN D 25 Hydroxy (Vit-D Deficiency, Fractures)  . Hemoglobin A1c  . Basic Metabolic Panel (BMET)  . Ambulatory referral to ENT  . Ambulatory referral to Neurology     The patient was counseled, risk factors were discussed, anticipatory guidance given.  Gross side effects, risk and benefits, and alternatives of medications discussed with patient.  Patient is aware that all medications have potential side effects and we are unable to predict every side effect or drug-drug interaction that may occur.  Expresses verbal understanding and consents to current therapy plan and treatment regimen.  Return for Follow-up 4 months for blood pressure, cholesterol, chronic kidney prediabetes, vitamin D deficiency.  Please see AVS handed out to patient at the end of our visit for further patient instructions/ counseling done pertaining to today's office visit.    Note:  This document was prepared using Dragon voice recognition software and may include unintentional dictation errors.   This document serves as a record of services personally performed by Mellody Dance, DO. It was created on her behalf by Toni Amend, a trained medical scribe. The creation of this record is based on the scribe's personal observations and the provider's statements to them.   I have reviewed the above medical documentation for accuracy and completeness and I concur.  Mellody Dance, DO 06/24/2018 1:06 PM       Subjective:    HPI: [name] is a 73 y.o. male who presents to [facility] today  for follow up of [place].    Concerns about Throat - "Feeling like heavy phlegm across windpipe." Wishes to be referred to pulmonary "because I have heavy phlegm right across my windpipe."  Unsure if he's allergic to wood dust.  "I've had that for years."  Says "it's heavy now, like I didn't drink enough water, "but if anything, I drink too much."  Says "it gets in the way or something, just like it's heavy phlegm if you were sick."  Denies SOB.  Notes "it's almost like a flap."  Has never seen pulmonology or another specialist for this in the past.  States he has to push in on his throat to help himself breathe.  States that this sensation is worse at night.  Confirms that he does perform sinus rinses.  States that his wife says he snores sometimes.  Patient Concerned about Parkinson's & Tremors Wants to be checked to see if he has Parkinson's.  Has a tremor that gets so bad "I can't even eat sometimes." Is not sure if he has resting tremors.  States "I may, particularly in the morning."  Says "evidently I was born to be a left-handed person."  Was ambidextrous his entire life, but recently started to notice "things feeling awkward" when he switches between hands.  History of Back Surgery Had major back surgery a few years ago.  Was under for  6.5 hours.  Says it was a year and 3 months "before I stepped out of the fog, so to speak."  "But I'm still not over that."  Says "before that, I used to multitask."  Feels he has experienced memory difficulties since his back surgery.  Vitamin D Deficiency - Supplementation Patient is taking his vitamin D 50k once weekly.  He feels no different.  States he has recently gained weight, but lost inches.  Says "I'm doing all the things I used to do," while currently remodeling his house.  Notes that he feels he's up eight pounds.  HTN:  -  His blood pressure has been controlled at home.  Pt has been checking it regularly.  States it's  been good at home, "better than the measurement today," usually 127-128/70's.  - Patient reports good compliance with blood pressure medications  - Denies medication S-E   - Smoking Status noted   - He denies new onset of: chest pain, exercise intolerance, shortness of breath, dizziness, visual changes, headache, lower extremity swelling or claudication.   Last 3 blood pressure readings in our office are as follows: BP Readings from Last 3 Encounters:  06/24/18 137/82  02/19/18 124/74  02/02/18 130/84    Pulse Readings from Last 3 Encounters:  06/24/18 87  02/19/18 75  02/02/18 65    Filed Weights   06/24/18 0929  Weight: 251 lb 14.4 oz (114.3 kg)    The cholesterol last visit was:  Lab Results  Component Value Date   CHOL 198 01/26/2018   HDL 57 01/26/2018   LDLCALC 112 (H) 01/26/2018   TRIG 145 01/26/2018   CHOLHDL 3.5 01/26/2018    Hepatic Function Latest Ref Rng & Units 01/26/2018 03/29/2016 01/24/2015  Total Protein 6.0 - 8.5 g/dL 6.5 6.9 6.4(L)  Albumin 3.5 - 4.8 g/dL 4.3 4.2 3.9  AST 0 - 40 IU/L _0 ALT 0 - 44 IU/L 16 32 25  Alk Phosphatase 39 - 117 IU/L 41 50 51  Total Bilirubin 0.0 - 1.2 mg/dL 0.4 0.5 0.5    Patient Care Team    Relationship Specialty Notifications Start End  Mellody Dance, DO PCP - General Family Medicine  10/28/17   Martin[place], Peter M, MD Consulting Physician Cardiology  10/28/17   Renette Butters, MD Attending Physician Orthopedic Surgery  10/28/17   Irene Shipper, MD Consulting Physician Gastroenterology  10/28/17   Myrlene Broker, MD Attending Physician Urology  10/28/17   Marilynne Halsted, MD Referring Physician Ophthalmology  10/28/17   Administration, Veterans    02/18/18      Lab Results  Component Value Date   CREATININE 1.24 01/26/2018   BUN 22 01/26/2018   NA 139 01/26/2018   K 4.1 01/26/2018   CL 101 01/26/2018   CO2 24 01/26/2018    Lab Results  Component Value Date   CHOL 198 01/26/2018    Lab  Results  Component Value Date   HDL 57 01/26/2018    Lab Results  Component Value Date   LDLCALC 112 (H) 01/26/2018    Lab Results  Component Value Date   TRIG 145 01/26/2018    Lab Results  Component Value Date   CHOLHDL 3.5 01/26/2018    No results found for: LDLDIRECT ===================================================================   Patient Active Problem List   Diagnosis Date Noted  . Hyperlipidemia 08/25/2007    Priority: High  . Essential hypertension 08/25/2007    Priority: High  . Mood  disorder ([place]) 08/25/2007    Priority: Medium  . GASTROESOPHAGEAL REFLUX DISEASE 08/25/2007    Priority: Medium  . Benign non-nodular prostatic hyperplasia with lower urinary tract symptoms 04/11/2014    Priority: Low  . 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    Priority: Low  . Memory difficulties 06/24/2018  . Tremors of nervous system 06/24/2018  . Feared complaint without diagnosis- believes he has parkinsons 06/24/2018  . Hypertriglyceridemia 02/02/2018  . obesity (BMI 35.0-39.9) with comorbidity ([place]) 02/02/2018  . Glucose intolerance (impaired glucose tolerance) 02/02/2018  . Vitamin D insufficiency 02/02/2018  . h/o Low serum vitamin B12 02/02/2018  . Chronic kidney disease (CKD) stage G3b/A1, moderately decreased glomerular filtration rate (GFR) between 30-44 mL/min/1.73 square meter and albuminuria creatinine ratio less than 30 mg/g (HCC) 02/02/2018  . Morbid obesity ([place]) 10/28/2017  . Inactivity 10/28/2017  . Family history of malignant neoplasm of prostate 06/27/2017  . Snores 12/03/2016  . Primary localized osteoarthritis of right knee 08/14/2016  . Primary localized osteoarthritis of left knee 04/10/2016  . History of radial keratotomy 02/05/2016  . Macular puckering, right eye 01/02/2016  . Nuclear sclerotic cataract of right eye 01/02/2016  . Pseudophakia of left eye 01/02/2016  . S/P lumbar fusion 10/24/2014  . Degenerative  spondylolisthesis 09/28/2014  . Spinal stenosis of lumbar region 09/14/2014  . Midline low back pain without sciatica 07/18/2014  . DDD (degenerative disc disease), lumbosacral 07/18/2014  . Dyspnea 07/19/2013  . COLONIC POLYPS, ADENOMATOUS 08/25/2007  . EXTERNAL HEMORRHOIDS 08/25/2007  . ASTHMA 08/25/2007  . ESOPHAGITIS 08/25/2007  . GASTRITIS 08/25/2007  . DIVERTICULOSIS OF COLON 08/25/2007  . OSTEOARTHRITIS 08/25/2007  . Sleep apnea 08/25/2007  . PYROSIS 08/25/2007  . EPIGASTRIC PAIN 08/25/2007     Past Medical History:  Diagnosis Date  . Asthma    as a child  . Colon polyps   . Depression   . Dyspnea   . Epigastric pain   . Hard of hearing   . Headache    migraines  . History of bronchitis   . History of pneumonia   . Hyperlipidemia   . Hypertension   . Knee pain   . Nausea    Occasional  . Osteoarthritis   . Sigmoid diverticulosis   . Wears glasses      Past Surgical History:  Procedure Laterality Date  . APPENDECTOMY    . BACK SURGERY    . COLONOSCOPY  11/06/2004   "several"  . EYE SURGERY Left   . HERNIA REPAIR    . JOINT REPLACEMENT    . MASS EXCISION Right 01/26/2015   Procedure: REMOVAL OF RIGHT CHEST WALL MASS;  Surgeon: Jackolyn Confer, MD;  Location: [facility];  Service: General;  Laterality: Right;  . MULTIPLE TOOTH EXTRACTIONS    . POLYPECTOMY    . SHOULDER SURGERY Right 2000s   "severed muscle years beforee; broke collar bone; dr repaired both at the same time"  . TONSILLECTOMY    . TOTAL KNEE ARTHROPLASTY Left 04/10/2016   Procedure: LEFT TOTAL KNEE ARTHROPLASTY;  Surgeon: Ninetta Lights, MD;  Location: [facility];  Service: Orthopedics;  Laterality: Left;  LEFT TOTAL KNEE ARTHROPLASTY  . TOTAL KNEE ARTHROPLASTY Right 08/14/2016  . TOTAL KNEE ARTHROPLASTY Right 08/14/2016   Procedure: RIGHT TOTAL KNEE ARTHROPLASTY;  Surgeon: Ninetta Lights, MD;  Location: [facility];  Service: Orthopedics;  Laterality: Right;     Family History    Problem Relation Age of Onset  .  Heart disease Father   . Colon cancer Maternal Grandfather   . Heart disease Paternal Grandmother      Social History   Substance and Sexual Activity  Drug Use No  ,  Social History   Substance and Sexual Activity  Alcohol Use Yes  . Alcohol/week: 4.0 standard drinks  . Types: 4 Standard drinks or equivalent per week  ,  Social History   Tobacco Use  Smoking Status Former Smoker  . Packs/day: 20.00  . Years: 0.50  . Pack years: 10.00  . Types: Cigarettes  . Last attempt to quit: 04/29/1983  . Years since quitting: 35.1  Smokeless Tobacco Never Used  ,    Current Outpatient Medications on File Prior to Visit  Medication Sig Dispense Refill  . aspirin EC 81 MG tablet Take 81 mg by mouth daily.    . benazepril (LOTENSIN) 20 MG tablet Take 1.5 tablets (30 mg total) by mouth daily. 135 tablet 3  . Calcium Carbonate-Vitamin D 600-400 MG-UNIT tablet Take 1 tablet by mouth 2 (two) times daily. 60 tablet 11  . celecoxib (CELEBREX) 200 MG capsule Take 1 capsule by mouth daily.    . Choline Fenofibrate (FENOFIBRIC ACID) 135 MG CPDR Take 135 mg by mouth daily.    Marland Kitchen CINNAMON PO Take 1 capsule by mouth 2 (two) times daily.     Marland Kitchen ezetimibe (ZETIA) 10 MG tablet Take 1 tablet (10 mg total) by mouth daily. (Patient taking differently: Take 10 mg by mouth at bedtime. ) 90 tablet 3  . Garlic 9678 MG CAPS Take 1,000 mg by mouth 2 (two) times a week.    . Multiple Vitamin (MULTIVITAMIN) capsule Take by mouth.    . vitamin B-12 (CYANOCOBALAMIN) 100 MCG tablet Take by mouth.    . Vitamin D, Ergocalciferol, (DRISDOL) 50000 units CAPS capsule Take 1 capsule (50,000 Units total) by mouth every 7 (seven) days. 12 capsule 10   No current facility-administered medications on file prior to visit.      Allergies  Allergen Reactions  . Amoxicillin Hives and Rash    Has patient had a PCN reaction causing immediate rash, facial/tongue/throat swelling, SOB or  lightheadedness with hypotension: #  #  #  YES  #  #  #  Has patient had a PCN reaction causing SEVERE RASH INVOLVING MUCUS MEMBRANES or SKIN NECROSIS: #  #  #  YES  #  #  #  Has patient had a PCN reaction that required hospitalization No Has patient had a PCN reaction occurring within the last 10 years: #  #  #  YES  #  #  #  If all of the above answers are "NO", then may proceed with Cephalosporin use.   . Codeine Other (See Comments)    *  *  Pt does not have allergy to codeine but father had severe reaction to codeine so he never wants to be given to him  *  *  . Toprol Xl [Metoprolol Succinate] Shortness Of Breath  . Atorvastatin Other (See Comments)    Weakness in muscles  . Statins Other (See Comments)    Weakness in muscles respiratory  . Other     intolerant     Review of Systems:   General:  Denies fever, chills Optho/Auditory:   Denies visual changes, blurred vision Respiratory:   Denies SOB, cough, wheeze, DIB  Cardiovascular:   Denies chest pain, palpitations, painful respirations Gastrointestinal:   Denies nausea, vomiting, diarrhea.  Endocrine:     Denies new hot or cold intolerance Musculoskeletal:  Denies joint swelling, gait issues, or new unexplained myalgias/ arthralgias Skin:  Denies rash, suspicious lesions  Neurological:    Denies dizziness, unexplained weakness, numbness  Psychiatric/Behavioral:   Denies mood changes  Objective:    Blood pressure 137/82, pulse 87, height _0  (1.778 m), weight 251 lb 14.4 oz (114.3 kg), SpO2 95 %.  Body mass index is 36.14 kg/m.  General: Well Developed, well nourished, and in no acute distress.  HEENT: Normocephalic, atraumatic, pupils equal round reactive to light, neck supple, No carotid bruits, no JVD Skin: Warm and dry, cap RF less 2 sec Cardiac: Regular rate and rhythm, S1, S2 WNL's, no murmurs rubs or gallops Respiratory: ECTA B/L, Not using accessory muscles, speaking in full sentences. NeuroM-Sk: Ambulates  w/o assistance, moves ext * 4 w/o difficulty, sensation grossly intact.  No Parkinsonian symptoms appreciated in physical, no tremors. Ext: scant edema b/l lower ext Psych: No HI/SI, judgement and insight good, Euthymic mood. Full Affect.

## 2018-06-24 NOTE — Patient Instructions (Addendum)
Please realize, EXERCISE IS MEDICINE!  -  American Heart Association ( AHA) guidelines for exercise : If you are in good health, without any medical conditions, you should engage in 150-300 minutes of moderate intensity aerobic activity per week.  This means you should be huffing and puffing throughout your workout.   Engaging in regular exercise will improve brain function and memory, as well as improve mood, boost immune system and help with weight management.  As well as the other, more well-known effects of exercise such as decreasing blood sugar levels, decreasing blood pressure,  and decreasing bad cholesterol levels/ increasing good cholesterol levels.     -  The AHA strongly endorses consumption of a diet that contains a variety of foods from all the food categories with an emphasis on fruits and vegetables; fat-free and low-fat dairy products; cereal and grain products; legumes and nuts; and fish, poultry, and/or extra lean meats.    Excessive food intake, especially of foods high in saturated and trans fats, sugar, and salt, should be avoided.    Adequate water intake of roughly 1/2 of your weight in pounds, should equal the ounces of water per day you should drink.  So for instance, if you're 200 pounds, that would be 100 ounces of water per day.         Mediterranean Diet  Why follow it? Research shows. . Those who follow the Mediterranean diet have a reduced risk of heart disease  . The diet is associated with a reduced incidence of Parkinson's and Alzheimer's diseases . People following the diet may have longer life expectancies and lower rates of chronic diseases  . The Dietary Guidelines for Americans recommends the Mediterranean diet as an eating plan to promote health and prevent disease  What Is the Mediterranean Diet?  . Healthy eating plan based on typical foods and recipes of Mediterranean-style cooking . The diet is primarily a plant based diet; these foods should make up a  majority of meals   Starches - Plant based foods should make up a majority of meals - They are an important sources of vitamins, minerals, energy, antioxidants, and fiber - Choose whole grains, foods high in fiber and minimally processed items  - Typical grain sources include wheat, oats, barley, corn, brown rice, bulgar, farro, millet, polenta, couscous  - Various types of beans include chickpeas, lentils, fava beans, black beans, white beans   Fruits  Veggies - Large quantities of antioxidant rich fruits & veggies; 6 or more servings  - Vegetables can be eaten raw or lightly drizzled with oil and cooked  - Vegetables common to the traditional Mediterranean Diet include: artichokes, arugula, beets, broccoli, brussel sprouts, cabbage, carrots, celery, collard greens, cucumbers, eggplant, kale, leeks, lemons, lettuce, mushrooms, okra, onions, peas, peppers, potatoes, pumpkin, radishes, rutabaga, shallots, spinach, sweet potatoes, turnips, zucchini - Fruits common to the Mediterranean Diet include: apples, apricots, avocados, cherries, clementines, dates, figs, grapefruits, grapes, melons, nectarines, oranges, peaches, pears, pomegranates, strawberries, tangerines  Fats - Replace butter and margarine with healthy oils, such as olive oil, canola oil, and tahini  - Limit nuts to no more than a handful a day  - Nuts include walnuts, almonds, pecans, pistachios, pine nuts  - Limit or avoid candied, honey roasted or heavily salted nuts - Olives are central to the Mediterranean diet - can be eaten whole or used in a variety of dishes   Meats Protein - Limiting red meat: no more than a few times a month -   When eating red meat: choose lean cuts and keep the portion to the size of deck of cards - Eggs: approx. 0 to 4 times a week  - Fish and lean poultry: at least 2 a week  - Healthy protein sources include, chicken, turkey, lean beef, lamb - Increase intake of seafood such as tuna, salmon, trout,  mackerel, shrimp, scallops - Avoid or limit high fat processed meats such as sausage and bacon  Dairy - Include moderate amounts of low fat dairy products  - Focus on healthy dairy such as fat free yogurt, skim milk, low or reduced fat cheese - Limit dairy products higher in fat such as whole or 2% milk, cheese, ice cream  Alcohol - Moderate amounts of red wine is ok  - No more than 5 oz daily for women (all ages) and men older than age 65  - No more than 10 oz of wine daily for men younger than 65  Other - Limit sweets and other desserts  - Use herbs and spices instead of salt to flavor foods  - Herbs and spices common to the traditional Mediterranean Diet include: basil, bay leaves, chives, cloves, cumin, fennel, garlic, lavender, marjoram, mint, oregano, parsley, pepper, rosemary, sage, savory, sumac, tarragon, thyme   It's not just a diet, it's a lifestyle:  . The Mediterranean diet includes lifestyle factors typical of those in the region  . Foods, drinks and meals are best eaten with others and savored . Daily physical activity is important for overall good health . This could be strenuous exercise like running and aerobics . This could also be more leisurely activities such as walking, housework, yard-work, or taking the stairs . Moderation is the key; a balanced and healthy diet accommodates most foods and drinks . Consider portion sizes and frequency of consumption of certain foods   Meal Ideas & Options:  . Breakfast:  o Whole wheat toast or whole wheat English muffins with peanut butter & hard boiled egg o Steel cut oats topped with apples & cinnamon and skim milk  o Fresh fruit: banana, strawberries, melon, berries, peaches  o Smoothies: strawberries, bananas, greek yogurt, peanut butter o Low fat greek yogurt with blueberries and granola  o Egg white omelet with spinach and mushrooms o Breakfast couscous: whole wheat couscous, apricots, skim milk, cranberries  . Sandwiches:   o Hummus and grilled vegetables (peppers, zucchini, squash) on whole wheat bread   o Grilled chicken on whole wheat pita with lettuce, tomatoes, cucumbers or tzatziki  o Tuna salad on whole wheat bread: tuna salad made with greek yogurt, olives, red peppers, capers, green onions o Garlic rosemary lamb pita: lamb sauted with garlic, rosemary, salt & pepper; add lettuce, cucumber, greek yogurt to pita - flavor with lemon juice and black pepper  . Seafood:  o Mediterranean grilled salmon, seasoned with garlic, basil, parsley, lemon juice and black pepper o Shrimp, lemon, and spinach whole-grain pasta salad made with low fat greek yogurt  o Seared scallops with lemon orzo  o Seared tuna steaks seasoned salt, pepper, coriander topped with tomato mixture of olives, tomatoes, olive oil, minced garlic, parsley, green onions and cappers  . Meats:  o Herbed greek chicken salad with kalamata olives, cucumber, feta  o Red bell peppers stuffed with spinach, bulgur, lean ground beef (or lentils) & topped with feta   o Kebabs: skewers of chicken, tomatoes, onions, zucchini, squash  o Turkey burgers: made with red onions, mint, dill, lemon juice, feta   cheese topped with roasted red peppers  Vegetarian o Cucumber salad: cucumbers, artichoke hearts, celery, red onion, feta cheese, tossed in olive oil & lemon juice  o Hummus and whole grain pita points with a greek salad (lettuce, tomato, feta, olives, cucumbers, red onion) o Lentil soup with celery, carrots made with vegetable broth, garlic, salt and pepper  o Tabouli salad: parsley, bulgur, mint, scallions, cucumbers, tomato, radishes, lemon juice, olive oil, salt and pepper.     Chronic Kidney Disease, Adult Chronic kidney disease (CKD) occurs when the kidneys become damaged slowly over a long period of time. The kidneys are a pair of organs that do many important jobs in the body, including:  Removing waste and extra fluid from the blood to make  urine.  Making hormones that maintain the amount of fluid in tissues and blood vessels.  Maintaining the right amount of fluids and chemicals in the body. A small amount of kidney damage may not cause problems, but a large amount of damage may make it hard or impossible for the kidneys to work the way they should. If steps are not taken to slow down kidney damage or to stop it from getting worse, the kidneys may stop working permanently (end-stage renal disease or ESRD). Most of the time, CKD does not go away, but it can often be controlled. People who have CKD are usually able to live normal lives. What are the causes? The most common causes of this condition are diabetes and high blood pressure (hypertension). Other causes include:  Heart and blood vessel (cardiovascular) disease.  Kidney diseases, such as: ? Glomerulonephritis. ? Interstitial nephritis. ? Polycystic kidney disease. ? Renal vascular disease.  Diseases that affect the immune system.  Genetic diseases.  Medicines that damage the kidneys, such as anti-inflammatory medicines.  Being around or being in contact with poisonous (toxic) substances.  A kidney or urinary infection that occurs again and again (recurs).  Vasculitis. This is swelling or inflammation of the blood vessels.  A problem with urine flow that may be caused by: ? Cancer. ? Having kidney stones more than one time. ? An enlarged prostate, in males. What increases the risk? You are more likely to develop this condition if you:  Are older than age 65.  Are male.  Are African-American, Hispanic, Asian, [place], or American [place].  Are a current or former smoker.  Are obese.  Have a family history of kidney disease or failure.  Often take medicines that are damaging to the kidneys. What are the signs or symptoms? Symptoms of this condition include:  Swelling (edema) of the face, legs, ankles, or feet.  Tiredness (lethargy) and  having less energy.  Nausea or vomiting.  Confusion or trouble concentrating.  Problems with urination, such as: ? Painful or burning feeling during urination. ? Decreased urine production. ? Frequent urination, especially at night. ? Bloody urine.  Muscle twitches and cramps, especially in the legs.  Shortness of breath.  Weakness.  Loss of appetite.  Metallic taste in the mouth.  Trouble sleeping.  Dry, itchy skin.  A low blood count (anemia).  Pale lining of the eyelids and surface of the eye (conjunctiva). Symptoms develop slowly and may not be obvious until the kidney damage becomes severe. It is possible to have kidney disease for years without having any symptoms. How is this diagnosed? This condition may be diagnosed based on:  Blood tests.  Urine tests.  Imaging tests, such as an ultrasound or CT scan.  A test in which a sample of tissue is removed from the kidneys to be examined under a microscope (kidney biopsy). These test results will help your health care provider determine how serious the CKD is. How is this treated? There is no cure for most cases of this condition, but treatment usually relieves symptoms and prevents or slows the progression of the disease. Treatment may include:  Making diet changes, which may require you to avoid alcohol, salty foods (sodium), and foods that are high in potassium, calcium, and protein.  Medicines: ? To lower blood pressure. ? To control blood glucose. ? To relieve anemia. ? To relieve swelling. ? To protect your bones. ? To improve the balance of electrolytes in your blood.  Removing toxic waste from the body through types of dialysis, if the kidneys can no longer do their job (kidney failure).  Managing any other conditions that are causing your CKD or making it worse. Follow these instructions at home: Medicines  Take over-the-counter and prescription medicines only as told by your health care provider.  The dose of some medicines that you take may need to be adjusted.  Do not take any new medicines unless approved by your health care provider. Many medicines can worsen your kidney damage.  Do not take any vitamin and mineral supplements unless approved by your health care provider. Many nutritional supplements can worsen your kidney damage. General instructions  Follow your prescribed diet as told by your health care provider.  Do not use any products that contain nicotine or tobacco, such as cigarettes and e-cigarettes. If you need help quitting, ask your health care provider.  Monitor and track your blood pressure at home. Report changes in your blood pressure as told by your health care provider.  If you are being treated for diabetes, monitor and track your blood sugar (blood glucose) levels as told by your health care provider.  Maintain a healthy weight. If you need help with this, ask your health care provider.  Start or continue an exercise plan. Exercise at least 30 minutes a day, 5 days a week.  Keep your immunizations up to date as told by your health care provider.  Keep all follow-up visits as told by your health care provider. This is important. Where to find more information  American Association of Kidney Patients: BombTimer.gl  National Kidney Foundation: www.kidney.[place]: https://mathis.com/  [facility]: www.lifeoptions.org and www.kidneyschool.org Contact a health care provider if:  Your symptoms get worse.  You develop new symptoms. Get help right away if:  You develop symptoms of ESRD, which include: ? Headaches. ? Numbness in the hands or feet. ? Easy bruising. ? Frequent hiccups. ? Chest pain. ? Shortness of breath. ? Lack of menstruation, in women.  You have a fever.  You have decreased urine production.  You have pain or bleeding when you urinate. Summary  Chronic kidney disease (CKD) occurs when  the kidneys become damaged slowly over a long period of time.  The most common causes of this condition are diabetes and high blood pressure (hypertension).  There is no cure for most cases of this condition, but treatment usually relieves symptoms and prevents or slows the progression of the disease. Treatment may include a combination of medicines and lifestyle changes. This information is not intended to replace advice given to you by your health care provider. Make sure you discuss any questions you have with your health care provider. Document Released: 03/05/2008 Document  Revised: 07/04/2016 Document Reviewed: 07/04/2016 Elsevier Interactive Patient Education  [employer].

## 2018-06-25 ENCOUNTER — Encounter: Payer: Self-pay | Admitting: Neurology

## 2018-06-25 ENCOUNTER — Ambulatory Visit (INDEPENDENT_AMBULATORY_CARE_PROVIDER_SITE_OTHER): Payer: PPO | Admitting: Neurology

## 2018-06-25 ENCOUNTER — Telehealth: Payer: Self-pay | Admitting: Neurology

## 2018-06-25 VITALS — BP 170/110 | HR 74 | Ht 70.0 in | Wt 255.0 lb

## 2018-06-25 DIAGNOSIS — R419 Unspecified symptoms and signs involving cognitive functions and awareness: Secondary | ICD-10-CM

## 2018-06-25 DIAGNOSIS — R4181 Age-related cognitive decline: Secondary | ICD-10-CM

## 2018-06-25 DIAGNOSIS — R251 Tremor, unspecified: Secondary | ICD-10-CM | POA: Diagnosis not present

## 2018-06-25 LAB — BASIC METABOLIC PANEL
BUN/Creatinine Ratio: 19 (ref 10–24)
BUN: 23 mg/dL (ref 8–27)
CO2: 26 mmol/L (ref 20–29)
CREATININE: 1.19 mg/dL (ref 0.76–1.27)
Calcium: 10 mg/dL (ref 8.6–10.2)
Chloride: 102 mmol/L (ref 96–106)
GFR calc Af Amer: 69 mL/min/{1.73_m2} (ref >59)
GFR, EST NON AFRICAN AMERICAN: 60 mL/min/{1.73_m2} (ref >59)
Glucose: 86 mg/dL (ref 65–99)
POTASSIUM: 4.2 mmol/L (ref 3.5–5.2)
Sodium: 142 mmol/L (ref 134–144)

## 2018-06-25 LAB — HEMOGLOBIN A1C
Est. average glucose Bld gHb Est-mCnc: 120 mg/dL
HEMOGLOBIN A1C: 5.8 % — AB (ref 4.8–5.6)

## 2018-06-25 LAB — VITAMIN D 25 HYDROXY (VIT D DEFICIENCY, FRACTURES): VIT D 25 HYDROXY: 33.1 ng/mL (ref 30.0–100.0)

## 2018-06-25 NOTE — Telephone Encounter (Signed)
Health team order sent to GI pt is aware
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
## 2018-06-25 NOTE — Patient Instructions (Addendum)
You do have a mild tremor, a little worse on the R.   I do not see any signs or symptoms of parkinson's like disease or what we call parkinsonism.   For your tremor, I would not recommend any new medication for fear of side effects (especially sleepiness) or medication interactions, especially in light of the mild findings.   Please remember, that any kind of tremor may be exacerbated/get worse by anxiety, anger, nervousness, excitement, dehydration, sleep deprivation, by caffeine, and low blood sugar values or blood sugar fluctuations. Some medications can exacerbate tremors.   We will do a brain scan, called MRI and call you with the test results. We will have to schedule you for this on a separate date. This test requires authorization from your insurance, and we will take care of the insurance process.  I will see you routinely in 6 months for a recheck.

## 2018-06-26 DIAGNOSIS — R351 Nocturia: Secondary | ICD-10-CM | POA: Diagnosis not present

## 2018-06-26 DIAGNOSIS — Z8042 Family history of malignant neoplasm of prostate: Secondary | ICD-10-CM | POA: Diagnosis not present

## 2018-06-26 DIAGNOSIS — R39198 Other difficulties with micturition: Secondary | ICD-10-CM | POA: Diagnosis not present

## 2018-06-26 DIAGNOSIS — R3912 Poor urinary stream: Secondary | ICD-10-CM | POA: Diagnosis not present

## 2018-06-26 DIAGNOSIS — N529 Male erectile dysfunction, unspecified: Secondary | ICD-10-CM | POA: Diagnosis not present

## 2018-06-26 DIAGNOSIS — N401 Enlarged prostate with lower urinary tract symptoms: Secondary | ICD-10-CM | POA: Diagnosis not present

## 2018-06-30 DIAGNOSIS — K219 Gastro-esophageal reflux disease without esophagitis: Secondary | ICD-10-CM | POA: Insufficient documentation

## 2018-07-03 DIAGNOSIS — Z8042 Family history of malignant neoplasm of prostate: Secondary | ICD-10-CM | POA: Diagnosis not present

## 2018-07-05 ENCOUNTER — Ambulatory Visit
Admission: RE | Admit: 2018-07-05 | Discharge: 2018-07-05 | Disposition: A | Discharge status: Home / Self Care | Payer: PPO | Source: Ambulatory Visit | Attending: Neurology | Admitting: Neurology

## 2018-07-05 DIAGNOSIS — R419 Unspecified symptoms and signs involving cognitive functions and awareness: Secondary | ICD-10-CM

## 2018-07-05 DIAGNOSIS — R251 Tremor, unspecified: Secondary | ICD-10-CM

## 2018-07-05 DIAGNOSIS — R4181 Age-related cognitive decline: Secondary | ICD-10-CM

## 2018-07-05 MED ORDER — GADOBENATE DIMEGLUMINE 529 MG/ML IV SOLN
20.0000 mL | Freq: Once | INTRAVENOUS | Status: AC | PRN
  Administered 2018-07-05: 20 mL via INTRAVENOUS

## 2018-07-06 ENCOUNTER — Telehealth: Payer: Self-pay

## 2018-07-06 NOTE — Progress Notes (Signed)
Please call patient regarding the recent brain MRI: The brain scan showed a normal structure of the brain and mild volume loss which we call atrophy. There were changes in the deeper structures of the brain, which we call white matter changes or microvascular changes. These were reported as mild in His case. These are tiny white spots, that occur with time and are seen in a variety of conditions, including with normal aging, chronic hypertension, chronic headaches, especially migraine HAs, chronic diabetes, chronic hyperlipidemia. These are not strokes and no mass or lesion or contrast enhancement was seen which is reassuring. Again, there were no acute findings, such as a stroke, or mass or blood products.  Overall, report indicates, age-appropriate findings.  No further action is required on this test at this time, other than re-enforcing the importance of good blood pressure control, good cholesterol control, good blood sugar control, and weight management. FU as scheduled.  Star Age, MD, PhD

## 2018-07-06 NOTE — Telephone Encounter (Signed)
I called pt and discussed his MRI results and recommendations. Pt is agreeable to following up in July. Pt verbalized understanding of results. Pt had no questions at this time but was encouraged to call back if questions arise.

## 2018-07-06 NOTE — Telephone Encounter (Signed)
-----   Message from Star Age, MD sent at 07/06/2018  7:33 AM EST ----- Please call patient regarding the recent brain MRI: The brain scan showed a normal structure of the brain and mild volume loss which we call atrophy. There were changes in the deeper structures of the brain, which we call white matter changes or microvascular changes. These were reported as mild in His case. These are tiny white spots, that occur with time and are seen in a variety of conditions, including with normal aging, chronic hypertension, chronic headaches, especially migraine HAs, chronic diabetes, chronic hyperlipidemia. These are not strokes and no mass or lesion or contrast enhancement was seen which is reassuring. Again, there were no acute findings, such as a stroke, or mass or blood products.  Overall, report indicates, age-appropriate findings.  No further action is required on this test at this time, other than re-enforcing the importance of good blood pressure control, good cholesterol control, good blood sugar control, and weight management. FU as scheduled.  Star Age, MD, PhD

## 2018-07-10 ENCOUNTER — Ambulatory Visit: Payer: PPO | Admitting: Family Medicine

## 2018-08-03 DIAGNOSIS — Z961 Presence of intraocular lens: Secondary | ICD-10-CM | POA: Diagnosis not present

## 2018-08-03 DIAGNOSIS — H04123 Dry eye syndrome of bilateral lacrimal glands: Secondary | ICD-10-CM | POA: Diagnosis not present

## 2018-08-24 NOTE — Progress Notes (Deleted)
[name] Date of Birth: [birth year] Medical Record #[id number]  History of Present Illness: [name] is seen for yearly follow up of HTN and hyperlipidemia. He has multiple drug intolerances. He is doing well on benazepril now. Blood pressure was elevated. He was taking some type of diet pill and his blood pressure shot up. He increased his benazepril to 30 mg daily and after a month BP has improved. He notes occ. Irregular pulse on his BP monitor.  He had a normal ETT in March 2015. He did undergo L4-5 back surgery in April 2017.  In October 2017 he underwent left TKR and in March 2018 he had a right TKR.  He is now followed at the [facility] for primary care. When last seen he had atypical chest pain and Myoview was done. It was low risk. He was seen by Neurology for a hand tremor that was felt to be benign. MRI of the brain showed no acute disease with some atrophy and chronic white matter disease.   He is still concerned about his weight. Has less tolerance when working in the hot sun. Not exercising regularly.   Current Outpatient Medications on File Prior to Visit  Medication Sig Dispense Refill  . aspirin EC 81 MG tablet Take 81 mg by mouth daily.    . benazepril (LOTENSIN) 20 MG tablet Take 1.5 tablets (30 mg total) by mouth daily. 135 tablet 3  . Calcium Carbonate-Vitamin D 600-400 MG-UNIT tablet Take 1 tablet by mouth 2 (two) times daily. 60 tablet 11  . celecoxib (CELEBREX) 200 MG capsule Take 1 capsule by mouth daily.    . Choline Fenofibrate (FENOFIBRIC ACID) 135 MG CPDR Take 135 mg by mouth daily.    Marland Kitchen CINNAMON PO Take 1 capsule by mouth 2 (two) times daily.     Marland Kitchen ezetimibe (ZETIA) 10 MG tablet Take 1 tablet (10 mg total) by mouth daily. (Patient taking differently: Take 10 mg by mouth at bedtime. ) 90 tablet 3  . Garlic 8250 MG CAPS Take 1,000 mg by mouth 2 (two) times a week.    . Multiple Vitamin (MULTIVITAMIN) capsule Take by mouth.    . vitamin B-12 (CYANOCOBALAMIN) 100  MCG tablet Take by mouth.    . Vitamin D, Ergocalciferol, (DRISDOL) 50000 units CAPS capsule Take 1 capsule (50,000 Units total) by mouth every 7 (seven) days. 12 capsule 10   No current facility-administered medications on file prior to visit.     Allergies  Allergen Reactions  . Amoxicillin Hives and Rash    Has patient had a PCN reaction causing immediate rash, facial/tongue/throat swelling, [name]: #  #  #  YES  #  #  #  Has patient had a PCN reaction causing SEVERE RASH INVOLVING MUCUS MEMBRANES or SKIN NECROSIS: #  #  #  YES  #  #  #  Has patient had a PCN reaction that required hospitalization No Has patient had a PCN reaction occurring within the last 10 years: #  #  #  YES  #  #  #  If all of the above answers are "NO", then may proceed with Cephalosporin use.   . Codeine Other (See Comments)    *  *  Pt does not have allergy to codeine but father had severe reaction to codeine so he never wants to be given to him  *  *  . Toprol Xl [Metoprolol Succinate] Shortness Of Breath  .  Atorvastatin Other (See Comments)    Weakness in muscles  . Statins Other (See Comments)    Weakness in muscles respiratory  . Other     intolerant    Past Medical History:  Diagnosis Date  . Asthma    as a child  . Colon polyps   . Depression   . Dyspnea   . Epigastric pain   . Hard of hearing   . Headache    migraines  . History of bronchitis   . History of pneumonia   . Hyperlipidemia   . Hypertension   . Knee pain   . Nausea    Occasional  . Osteoarthritis   . Sigmoid diverticulosis   . Wears glasses     Past Surgical History:  Procedure Laterality Date  . APPENDECTOMY    . BACK SURGERY    . COLONOSCOPY  11/06/2004   "several"  . EYE SURGERY Left   . HERNIA REPAIR    . JOINT REPLACEMENT    . MASS EXCISION Right 01/26/2015   Procedure: REMOVAL OF RIGHT CHEST WALL MASS;  Surgeon: Jackolyn Confer, MD;  Location: [facility];   Service: General;  Laterality: Right;  . MULTIPLE TOOTH EXTRACTIONS    . POLYPECTOMY    . SHOULDER SURGERY Right 2000s   "severed muscle years beforee; broke collar bone; dr repaired both at the same time"  . TONSILLECTOMY    . TOTAL KNEE ARTHROPLASTY Left 04/10/2016   Procedure: LEFT TOTAL KNEE ARTHROPLASTY;  Surgeon: Ninetta Lights, MD;  Location: [facility];  Service: Orthopedics;  Laterality: Left;  LEFT TOTAL KNEE ARTHROPLASTY  . TOTAL KNEE ARTHROPLASTY Right 08/14/2016  . TOTAL KNEE ARTHROPLASTY Right 08/14/2016   Procedure: RIGHT TOTAL KNEE ARTHROPLASTY;  Surgeon: Ninetta Lights, MD;  Location: [facility];  Service: Orthopedics;  Laterality: Right;    Social History   Tobacco Use  Smoking Status Former Smoker  . Packs/day: 20.00  . Years: 0.50  . Pack years: 10.00  . Types: Cigarettes  . Last attempt to quit: 04/29/1983  . Years since quitting: 35.3  Smokeless Tobacco Never Used    Social History   Substance and Sexual Activity  Alcohol Use Yes  . Alcohol/week: 4.0 standard drinks  . Types: 4 Standard drinks or equivalent per week    Family History  Problem Relation Age of Onset  . Heart disease Father   . Colon cancer Maternal Grandfather   . Heart disease Paternal Grandmother     Review of Systems: As noted in HPI.  All other systems were reviewed and are negative.  Physical Exam: There were no vitals taken for this visit. GENERAL:  Well appearing WM in NAD. obese HEENT:  PERRL, EOMI, sclera are clear. Oropharynx is clear. NECK:  No jugular venous distention, carotid upstroke brisk and symmetric, no bruits, no thyromegaly or adenopathy LUNGS:  Clear to auscultation bilaterally CHEST:  Unremarkable HEART:  RRR,  PMI not displaced or sustained,S1 and S2 within normal limits, no S3, no S4: no clicks, no rubs, no murmurs ABD:  Soft, nontender. BS +, no masses or bruits. No hepatomegaly, no splenomegaly EXT:  2 + pulses throughout, no edema, no cyanosis no  clubbing SKIN:  Warm and dry.  No rashes NEURO:  Alert and oriented x 3. Cranial nerves II through XII intact. PSYCH:  Cognitively intact    LABORATORY DATA: Ecg shows normal sinus rhythm with occ. PACs.  Otherwise normal.   I have personally reviewed  and interpreted this study.  Lab Results  Component Value Date   WBC 7.2 01/26/2018   HGB 15.1 01/26/2018   HCT 44.9 01/26/2018   PLT 342 01/26/2018   GLUCOSE 86 06/24/2018   CHOL 198 01/26/2018   TRIG 145 01/26/2018   HDL 57 01/26/2018   LDLCALC 112 (H) 01/26/2018   ALT 16 01/26/2018   AST 16 01/26/2018   NA 142 06/24/2018   K 4.2 06/24/2018   CL 102 06/24/2018   CREATININE 1.19 06/24/2018   BUN 23 06/24/2018   CO2 26 06/24/2018   TSH 4.090 01/26/2018   INR 1.04 03/29/2016   HGBA1C 5.8 (H) 06/24/2018    Labs reviewed from the [place] in 09/26/15: CBC, CMET, TSH all normal. A1c 5.6%. Cholesterol 226, trig 113, HDL 76, LDL 122.   Myoview 03/26/17: Study Highlights     The left ventricular ejection fraction is normal (55-65%).  Nuclear stress EF: 61%.  There was no ST segment deviation noted during stress.  Defect 1: There is a small defect of moderate severity present in the apex location.  This is a low risk study.   Low risk stress nuclear study with apical thinning but no ischemia; EF 61 with normal wall motion.      Assessment / Plan: 1. Hypertension. improved on increased dose of  benazepril. Avoid diet pills.   2. Hyperlipidemia.  Intolerant to statins in the past. Continue Zetia and fenofibrate 160 mg daily. continue fish oil.   3. Atypical chest pain but with  risk factors. Myoview in October was low risk. Normal EF.   4. Obesity. Encouraged increased aerobic activity and Carbohydrate restriction.  5. PACs. Benign.  I will follow up in 6 months.

## 2018-08-28 ENCOUNTER — Ambulatory Visit: Payer: PPO | Admitting: Cardiology

## 2018-10-21 ENCOUNTER — Telehealth: Payer: Self-pay | Admitting: Family Medicine

## 2018-10-21 NOTE — Telephone Encounter (Signed)
Spoke to patient he states that he is having pain behind right ear at the hairline when he touches the area which is no larger then his thumb x 2-3 days.  Patient states no pain other than that area when he touches.  No pain with movement of the neck or head.   Patient states that he has has neck pain into the head off and on for 1 week.  Patient is not as bad as his migraines that he has had in the past.  Patient describes the pain as deep and very tender to touch.  Please review and advise. MPulliam, CMA/RT(R)

## 2018-10-21 NOTE — Telephone Encounter (Signed)
Per Dr. Raliegh Scarlet patient needs an in office visit.  Only appointment time that we had that worked for the patient is Monday at 38.  Patient advised to call back with any questions or concerns before that time if needed.  Patient also advised if new symptoms develop, if the pain increases, or if it becomes constant to go to the [facility] [place] or the ER as soon as possible.  Patient advised to watch for red flag symptoms such as serve pain, dizziness, nausea, vomiting, confusion, etc and if noticed to go to the ER or call 911.  Patient expressed understanding.  MPulliam, CMA/RT(R)

## 2018-10-21 NOTE — Telephone Encounter (Signed)
Pt returned call to confirm Telehealth appt w/ provider but says had a More Serious issue (About 2-3 days ago and ongoing even today a terrible pain is running along his Head behind Rt ear/ he thinks it maybe an issue with his Carotid Artery and he request nurse call him-- Per patient he may need to come in to be seen & will bump the 4 month f/u for another time .  -- Patient request medical asst / nurse call him to discuss @ [phone]  --Forwarding message to medical assistant.  -glh

## 2018-10-26 ENCOUNTER — Other Ambulatory Visit: Payer: Self-pay

## 2018-10-26 ENCOUNTER — Ambulatory Visit (INDEPENDENT_AMBULATORY_CARE_PROVIDER_SITE_OTHER): Payer: PPO | Admitting: Family Medicine

## 2018-10-26 ENCOUNTER — Encounter: Payer: Self-pay | Admitting: Family Medicine

## 2018-10-26 VITALS — BP 126/79 | HR 79 | Temp 97.8°F | Ht 70.0 in | Wt 251.0 lb

## 2018-10-26 DIAGNOSIS — F439 Reaction to severe stress, unspecified: Secondary | ICD-10-CM | POA: Diagnosis not present

## 2018-10-26 DIAGNOSIS — M7912 Myalgia of auxiliary muscles, head and neck: Secondary | ICD-10-CM | POA: Diagnosis not present

## 2018-10-26 DIAGNOSIS — M62838 Other muscle spasm: Secondary | ICD-10-CM

## 2018-10-26 NOTE — Patient Instructions (Signed)
Muscle Cramps and Spasms Muscle cramps and spasms occur when a muscle or muscles tighten and you have no control over this tightening (involuntary muscle contraction). They are a common problem and can develop in any muscle. The most common place is in the calf muscles of the leg. Muscle cramps and muscle spasms are both involuntary muscle contractions, but there are some differences between the two:  Muscle cramps are painful. They come and go and may last for a few seconds or up to 15 minutes. Muscle cramps are often more forceful and last longer than muscle spasms.  Muscle spasms may or may not be painful. They may also last just a few seconds or much longer. Certain medical conditions, such as diabetes or Parkinson's disease, can make it more likely to develop cramps or spasms. However, cramps or spasms are usually not caused by a serious underlying problem. Common causes include:  Doing more physical work or exercise than your body is ready for (overexertion).  Overuse from repeating certain movements too many times.  Remaining in a certain position for a long period of time.  Improper preparation, form, or technique while playing a sport or doing an activity.  Dehydration.  Injury.  Side effects of some medicines.  Abnormally low levels of the salts and minerals in your blood (electrolytes), especially potassium and calcium. This could happen if you are taking water pills (diuretics) or if you are pregnant. In many cases, the cause of muscle cramps or spasms is not known. Follow these instructions at home: Managing pain and stiffness      Try massaging, stretching, and relaxing the affected muscle. Do this for several minutes at a time.  If directed, apply heat to tight or tense muscles as often as told by your health care provider. Use the heat source that your health care provider recommends, such as a moist heat pack or a heating pad. ? Place a towel between your skin and  the heat source. ? Leave the heat on for 20-30 minutes. ? Remove the heat if your skin turns bright red. This is especially important if you are unable to feel pain, heat, or cold. You may have a greater risk of getting burned.  If directed, put ice on the affected area. This may help if you are sore or have pain after a cramp or spasm. ? Put ice in a plastic bag. ? Place a towel between your skin and the bag. ? Leavethe ice on for 20 minutes, 2-3 times a day.  Try taking hot showers or baths to help relax tight muscles. Eating and drinking  Drink enough fluid to keep your urine pale yellow. Staying well hydrated may help prevent cramps or spasms.  Eat a healthy diet that includes plenty of nutrients to help your muscles function. A healthy diet includes fruits and vegetables, lean protein, whole grains, and low-fat or nonfat dairy products. General instructions  If you are having frequent cramps, avoid intense exercise for several days.  Take over-the-counter and prescription medicines only as told by your health care provider.  Pay attention to any changes in your symptoms.  Keep all follow-up visits as told by your health care provider. This is important. Contact a health care provider if:  Your cramps or spasms get more severe or happen more often.  Your cramps or spasms do not improve over time. Summary  Muscle cramps and spasms occur when a muscle or muscles tighten and you have no control over this   tightening (involuntary muscle contraction).  The most common place for cramps or spasms to occur is in the calf muscles of the leg.  Massaging, stretching, and relaxing the affected muscle may relieve the cramp or spasm.  Drink enough fluid to keep your urine pale yellow. Staying well hydrated may help prevent cramps or spasms. This information is not intended to replace advice given to you by your health care provider. Make sure you discuss any questions you have with your  health care provider. Document Released: 11/16/2001 Document Revised: 10/20/2017 Document Reviewed: 10/20/2017 Elsevier Interactive Patient Education  2019 [employer]  

## 2018-10-26 NOTE — Progress Notes (Signed)
Pt here for an acute care OV today-in office visit   Impression and Recommendations:    1. Neck muscle spasm   2. Sternocleidomastoid muscle tenderness   3. Stress-recent death of best friend    -Patient declined Flexeril or any anti-inflammatories.  He did use them in the past for neck pain/back pain and felt loopy on them. -Discussed with patient pathophysiology of this disease -  muscle spasms and attachment onto the mastoid process of his muscles which causes the point tenderness he is experiencing. -We discussed the stress of his recent friend dying and this causes tightness of the muscles and can lead to headaches which she has had in the past. -We discussed exercising and physical activity as a way to manage his stress.  He denies need for mood medications at this time. -Recommend moist heat to the area 15 minutes 3-4 times a day and stretching of the muscles which I showed him how to do today -He will keep his chronic follow-up office visits with me that he has in the near future.   Education and routine counseling performed. Handouts provided  Gross side effects, risk and benefits, and alternatives of medications and treatment plan in general discussed with patient.  Patient is aware that all medications have potential side effects and we are unable to predict every side effect or drug-drug interaction that may occur.   Patient will call with any questions prior to using medication if they have concerns.    Expresses verbal understanding and consents to current therapy and treatment regimen.  No barriers to understanding were identified.  Red flag symptoms and signs discussed in detail.  Patient expressed understanding regarding what to do in case of emergency\urgent symptoms   Please see AVS handed out to patient at the end of our visit for further patient instructions/ counseling done pertaining to today's office visit.   Return if symptoms worsen or fail to improve, for  Keep your follow-ups for your chronic care as scheduled.     Note:  This document was prepared occasionally using Dragon voice recognition software and may include unintentional dictation errors in addition to a scribe.    --------------------------------------------------------------------------------------------------------------------------------------------------------------------------------------------------------------------------------------------    Subjective:    CC:  Chief Complaint  Patient presents with   pain behind ear    HPI: [name] is a 73 y.o. male who presents to [facility] [facility] today for issues as discussed below.  I have not seen patient since January 2020.  Today he comes in for an acute issue. -Of note since seeing him he has seen his neurologist Dr. Rexene Alberts, his urologist Dr. Rosana Hoes and his otolaryngologist Dr. Arville Care.  -Patient meant to the office today because of right-sided neck pain to touch.  This occurs right behind his ear.  He first noticed it about a month ago or so.  It does not hurt him at all if he does not touch it.  Also it hurts if he rotates his neck to the left and puts his chin on his chest in that position.  This can cause pain otherwise, he has no other complaints.  He does have a history of getting frequent neck tightness of his lateral neck muscles which cause headaches.  He thinks that this might have been precipitated by stress.  -He went on to long discussion of his best friend whom he lost abruptly 2 weeks ago.  He thinks it was from an aneurysm that  just burst and he died immediately.  Patient is very bothered by this today.  He is worried about an aneurysm in his neck.  His friend was 59 years old and died suddenly.    -Patient states he is sleeping okay otherwise.  And does not need medications for stress management.  He states he will just try to exercise and move more.    Wt Readings from Last  3 Encounters:  10/26/18 251 lb (113.9 kg)  06/25/18 255 lb (115.7 kg)  06/24/18 251 lb 14.4 oz (114.3 kg)   BP Readings from Last 3 Encounters:  10/26/18 126/79  06/25/18 (!) 170/110  06/24/18 137/82   BMI Readings from Last 3 Encounters:  10/26/18 36.01 kg/m  06/25/18 36.59 kg/m  06/24/18 36.14 kg/m     Patient Care Team    Relationship Specialty Notifications Start End  Mellody Dance, DO PCP - General Family Medicine  10/28/17   Martinique, Peter M, MD Consulting Physician Cardiology  10/28/17   Renette Butters, MD Attending Physician Orthopedic Surgery  10/28/17   Irene Shipper, MD Consulting Physician Gastroenterology  10/28/17   Myrlene Broker, MD Attending Physician Urology  10/28/17   Marilynne Halsted, MD Referring Physician Ophthalmology  10/28/17   Administration, [place]    02/18/18      Patient Active Problem List   Diagnosis Date Noted   Hyperlipidemia 08/25/2007    Priority: High   Essential hypertension 08/25/2007    Priority: High   Mood disorder ([place]) 08/25/2007    Priority: Medium   GASTROESOPHAGEAL REFLUX DISEASE 08/25/2007    Priority: Medium   Benign non-nodular prostatic hyperplasia with lower urinary tract symptoms 04/11/2014    Priority: Low   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    Priority: Low   Laryngopharyngeal reflux (LPR) 06/30/2018   Memory difficulties 06/24/2018   Tremors of nervous system 06/24/2018   Feared complaint without diagnosis- believes he has parkinsons 06/24/2018   Hypertriglyceridemia 02/02/2018   obesity (BMI 35.0-39.9) with comorbidity ([place]) 02/02/2018   Glucose intolerance (impaired glucose tolerance) 02/02/2018   Vitamin D insufficiency 02/02/2018   h/o Low serum vitamin B12 02/02/2018   Chronic kidney disease (CKD) stage G3b/A1, moderately decreased glomerular filtration rate (GFR) between 30-44 mL/min/1.73 square meter and albuminuria creatinine ratio less than 30 mg/g ([facility])  02/02/2018   Morbid obesity ([place]) 10/28/2017   Inactivity 10/28/2017   Family history of malignant neoplasm of prostate 06/27/2017   Snores 12/03/2016   Primary localized osteoarthritis of right knee 08/14/2016   Primary localized osteoarthritis of left knee 04/10/2016   History of radial keratotomy 02/05/2016   Macular puckering, right eye 01/02/2016   Nuclear sclerotic cataract of right eye 01/02/2016   Pseudophakia of left eye 01/02/2016   S/P lumbar fusion 10/24/2014   Degenerative spondylolisthesis 09/28/2014   Spinal stenosis of lumbar region 09/14/2014   Midline low back pain without sciatica 07/18/2014   DDD (degenerative disc disease), lumbosacral 07/18/2014   Dyspnea 07/19/2013   COLONIC POLYPS, ADENOMATOUS 08/25/2007   EXTERNAL HEMORRHOIDS 08/25/2007   ASTHMA 08/25/2007   ESOPHAGITIS 08/25/2007   GASTRITIS 08/25/2007   DIVERTICULOSIS OF COLON 08/25/2007   OSTEOARTHRITIS 08/25/2007   Sleep apnea 08/25/2007   PYROSIS 08/25/2007   EPIGASTRIC PAIN 08/25/2007      Past Medical History:  Diagnosis Date   Asthma    as a child   Colon polyps    Depression    Dyspnea    Epigastric pain  Hard of hearing    Headache    migraines   History of bronchitis    History of pneumonia    Hyperlipidemia    Hypertension    Knee pain    Nausea    Occasional   Osteoarthritis    Sigmoid diverticulosis    Wears glasses      Past Surgical History:  Procedure Laterality Date   APPENDECTOMY     BACK SURGERY     COLONOSCOPY  11/06/2004   "several"   EYE SURGERY Left    HERNIA REPAIR     JOINT REPLACEMENT     MASS EXCISION Right 01/26/2015   Procedure: REMOVAL OF RIGHT CHEST WALL MASS;  Surgeon: Jackolyn Confer, MD;  Location: [facility];  Service: General;  Laterality: Right;   MULTIPLE TOOTH EXTRACTIONS     POLYPECTOMY     SHOULDER SURGERY Right 2000s   "severed muscle years beforee; broke collar  bone; dr repaired both at the same time"   TONSILLECTOMY     TOTAL KNEE ARTHROPLASTY Left 04/10/2016   Procedure: LEFT TOTAL KNEE ARTHROPLASTY;  Surgeon: Ninetta Lights, MD;  Location: [facility];  Service: Orthopedics;  Laterality: Left;  LEFT TOTAL KNEE ARTHROPLASTY   TOTAL KNEE ARTHROPLASTY Right 08/14/2016   TOTAL KNEE ARTHROPLASTY Right 08/14/2016   Procedure: RIGHT TOTAL KNEE ARTHROPLASTY;  Surgeon: Ninetta Lights, MD;  Location: [facility];  Service: Orthopedics;  Laterality: Right;     Family History  Problem Relation Age of Onset   Heart disease Father    Colon cancer Maternal Grandfather    Heart disease Paternal Grandmother      Social History   Socioeconomic History   Marital status: Married    Spouse name: Not on file   Number of children: Not on file   Years of education: Not on file   Highest education level: Not on file  Occupational History   Not on file  Social Needs   Financial resource strain: Not on file   Food insecurity:    Worry: Not on file    Inability: Not on file   Transportation needs:    Medical: Not on file    Non-medical: Not on file  Tobacco Use   Smoking status: Former Smoker    Packs/day: 20.00    Years: 0.50    Pack years: 10.00    Types: Cigarettes    Last attempt to quit: 04/29/1983    Years since quitting: 35.5   Smokeless tobacco: Never Used  Substance and Sexual Activity   Alcohol use: Yes    Alcohol/week: 4.0 standard drinks    Types: 4 Standard drinks or equivalent per week   Drug use: No   Sexual activity: Not Currently  Lifestyle   Physical activity:    Days per week: Not on file    Minutes per session: Not on file   Stress: Not on file  Relationships   Social connections:    Talks on phone: Not on file    Gets together: Not on file    Attends religious service: Not on file    Active member of club or organization: Not on file    Attends meetings of clubs or organizations: Not on file     Relationship status: Not on file   Intimate partner violence:    Fear of current or ex partner: Not on file    Emotionally abused: Not on file    Physically abused: Not on  file    Forced sexual activity: Not on file  Other Topics Concern   Not on file  Social History Narrative   Not on file     Current Meds  Medication Sig   aspirin EC 81 MG tablet Take 81 mg by mouth daily.   benazepril (LOTENSIN) 20 MG tablet Take 1.5 tablets (30 mg total) by mouth daily.   Calcium Carbonate-Vitamin D 600-400 MG-UNIT tablet Take 1 tablet by mouth 2 (two) times daily.   celecoxib (CELEBREX) 200 MG capsule Take 1 capsule by mouth 2 (two) times daily.    Choline Fenofibrate (FENOFIBRIC ACID) 135 MG CPDR Take 135 mg by mouth daily.   CINNAMON PO Take 1 capsule by mouth 2 (two) times daily.    ezetimibe (ZETIA) 10 MG tablet Take 1 tablet (10 mg total) by mouth daily. (Patient taking differently: Take 10 mg by mouth at bedtime. )   Garlic 0865 MG CAPS Take 1,000 mg by mouth 2 (two) times a week.   Multiple Vitamin (MULTIVITAMIN) capsule Take by mouth.   vitamin B-12 (CYANOCOBALAMIN) 100 MCG tablet Take by mouth.   Vitamin D, Ergocalciferol, (DRISDOL) 50000 units CAPS capsule Take 1 capsule (50,000 Units total) by mouth every 7 (seven) days.    Allergies:  Allergies  Allergen Reactions   Amoxicillin Hives and Rash    Has patient had a PCN reaction causing immediate rash, facial/tongue/throat swelling, [name]: #  #  #  YES  #  #  #  Has patient had a PCN reaction causing SEVERE RASH INVOLVING MUCUS MEMBRANES or SKIN NECROSIS: #  #  #  YES  #  #  #  Has patient had a PCN reaction that required hospitalization No Has patient had a PCN reaction occurring within the last 10 years: #  #  #  YES  #  #  #  If all of the above answers are "NO", then may proceed with Cephalosporin use.    Codeine Other (See Comments)    *  *  Pt does not have allergy to codeine  but father had severe reaction to codeine so he never wants to be given to him  *  *   Toprol Xl [Metoprolol Succinate] Shortness Of Breath   Atorvastatin Other (See Comments)    Weakness in muscles   Statins Other (See Comments)    Weakness in muscles respiratory   Other     intolerant     Review of Systems: General:   Denies fever, chills, unexplained weight loss.  Optho/Auditory:   Denies visual changes, blurred vision/LOV Respiratory:   Denies wheeze, DOE more than baseline levels.   Cardiovascular:   Denies chest pain, palpitations, new onset peripheral edema  Gastrointestinal:   Denies nausea, vomiting, diarrhea, abd pain.  Genitourinary: Denies dysuria, freq/ urgency, flank pain or discharge from genitals.  Endocrine:     Denies hot or cold intolerance, polyuria, polydipsia. Musculoskeletal:   Denies unexplained myalgias, joint swelling, unexplained arthralgias, gait problems.  Skin:  Denies new onset rash, suspicious lesions Neurological:     Denies dizziness, unexplained weakness, numbness  Psychiatric/Behavioral:   Denies mood changes, suicidal or homicidal ideations, hallucinations    Objective:   Blood pressure 126/79, pulse 79, temperature 97.8 F (36.6 C), height 5\' 10"  (1.778 m), weight 251 lb (113.9 kg), SpO2 97 %. Body mass index is 36.01 kg/m. General:  Well Developed, well nourished, appropriate for stated age.  Neuro:  Alert  and oriented,  extra-ocular muscles intact  HEENT:  Normocephalic, atraumatic, neck supple Skin:  no gross rash, warm, pink. Cardiac:  RRR, S1 S2 Respiratory:  ECTA B/L and A/P, Not using accessory muscles, speaking in full sentences- unlabored. Vascular:  Ext warm, no cyanosis apprec.; cap RF less 2 sec. Psych:  No HI/SI, judgement and insight good, Euthymic mood. Full Affect.

## 2018-10-28 ENCOUNTER — Ambulatory Visit: Payer: PPO | Admitting: Family Medicine

## 2018-11-03 ENCOUNTER — Other Ambulatory Visit: Payer: Self-pay

## 2018-11-03 ENCOUNTER — Encounter: Payer: Self-pay | Admitting: Family Medicine

## 2018-11-03 ENCOUNTER — Ambulatory Visit (INDEPENDENT_AMBULATORY_CARE_PROVIDER_SITE_OTHER): Payer: PPO | Admitting: Family Medicine

## 2018-11-03 VITALS — BP 134/84 | HR 81 | Temp 96.3°F | Ht 70.0 in | Wt 248.0 lb

## 2018-11-03 DIAGNOSIS — E781 Pure hyperglyceridemia: Secondary | ICD-10-CM | POA: Diagnosis not present

## 2018-11-03 DIAGNOSIS — R7302 Impaired glucose tolerance (oral): Secondary | ICD-10-CM | POA: Diagnosis not present

## 2018-11-03 DIAGNOSIS — E559 Vitamin D deficiency, unspecified: Secondary | ICD-10-CM | POA: Diagnosis not present

## 2018-11-03 DIAGNOSIS — E782 Mixed hyperlipidemia: Secondary | ICD-10-CM

## 2018-11-03 DIAGNOSIS — R7303 Prediabetes: Secondary | ICD-10-CM

## 2018-11-03 DIAGNOSIS — E538 Deficiency of other specified B group vitamins: Secondary | ICD-10-CM

## 2018-11-03 DIAGNOSIS — N183 Chronic kidney disease, stage 3 (moderate): Secondary | ICD-10-CM | POA: Diagnosis not present

## 2018-11-03 DIAGNOSIS — I10 Essential (primary) hypertension: Secondary | ICD-10-CM

## 2018-11-03 DIAGNOSIS — N1832 Chronic kidney disease, stage 3b: Secondary | ICD-10-CM
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
## 2018-12-07 ENCOUNTER — Ambulatory Visit: Payer: PPO | Admitting: Cardiology

## 2018-12-22 NOTE — Progress Notes (Signed)
[name] Date of Birth: 1946/02/26 Medical Record #[id number]  History of Present Illness: [name] is seen for yearly follow up of HTN and hyperlipidemia. He has multiple drug intolerances. He is doing well on benazepril now. Blood pressure was elevated. He was taking some type of diet pill and his blood pressure shot up. He increased his benazepril to 30 mg daily and after a month BP has improved. He notes occ. Irregular pulse on his BP monitor.  He had a normal ETT in March 2015. He did undergo L4-5 back surgery in April 2017.  In October 2017 he underwent left TKR and in March 2018 he had a right TKR.  He is followed at the [facility]. Also sees Dr [name] for primary care.  In 2018 he had atypical chest pain and Myoview was done. It was low risk.  On follow up today he is doing OK. BP has generally done well. Sugars have improved. He has occasional irregular heart beat on his monitor but no symptoms. Denies chest pain or SOB. He is more intolerant of heat and humidity now.   Current Outpatient Medications on File Prior to Visit  Medication Sig Dispense Refill  . aspirin EC 81 MG tablet Take 81 mg by mouth daily.    . benazepril (LOTENSIN) 20 MG tablet Take 1.5 tablets (30 mg total) by mouth daily. 135 tablet 3  . Calcium Carbonate-Vitamin D 600-400 MG-UNIT tablet Take 1 tablet by mouth 2 (two) times daily. 60 tablet 11  . celecoxib (CELEBREX) 200 MG capsule Take 1 capsule by mouth 2 (two) times daily.     . Choline Fenofibrate (FENOFIBRIC ACID) 135 MG CPDR Take 135 mg by mouth daily.    Marland Kitchen CINNAMON PO Take 1 capsule by mouth 2 (two) times daily.     Marland Kitchen ezetimibe (ZETIA) 10 MG tablet Take 1 tablet (10 mg total) by mouth daily. (Patient taking differently: Take 10 mg by mouth at bedtime. ) 90 tablet 3  . Garlic 0737 MG CAPS Take 1,000 mg by mouth 2 (two) times a week.    . Multiple Vitamin (MULTIVITAMIN) capsule Take by mouth.    . vitamin B-12 (CYANOCOBALAMIN) 100 MCG tablet Take by  mouth.    . Vitamin D, Ergocalciferol, (DRISDOL) 50000 units CAPS capsule Take 1 capsule (50,000 Units total) by mouth every 7 (seven) days. 12 capsule 10   No current facility-administered medications on file prior to visit.     Allergies  Allergen Reactions  . Amoxicillin Hives and Rash    Has patient had a PCN reaction causing immediate rash, facial/tongue/throat swelling, [name]: #  #  #  YES  #  #  #  Has patient had a PCN reaction causing SEVERE RASH INVOLVING MUCUS MEMBRANES or SKIN NECROSIS: #  #  #  YES  #  #  #  Has patient had a PCN reaction that required hospitalization No Has patient had a PCN reaction occurring within the last 10 years: #  #  #  YES  #  #  #  If all of the above answers are "NO", then may proceed with Cephalosporin use.   . Codeine Other (See Comments)    *  *  Pt does not have allergy to codeine but father had severe reaction to codeine so he never wants to be given to him  *  *  . Toprol Xl [Metoprolol Succinate] Shortness Of Breath  . Atorvastatin Other (  See Comments)    Weakness in muscles  . Statins Other (See Comments)    Weakness in muscles respiratory  . Other     intolerant    Past Medical History:  Diagnosis Date  . Asthma    as a child  . Colon polyps   . Depression   . Dyspnea   . Epigastric pain   . Hard of hearing   . Headache    migraines  . History of bronchitis   . History of pneumonia   . Hyperlipidemia   . Hypertension   . Knee pain   . Nausea    Occasional  . Osteoarthritis   . Sigmoid diverticulosis   . Wears glasses     Past Surgical History:  Procedure Laterality Date  . APPENDECTOMY    . BACK SURGERY    . COLONOSCOPY  11/06/2004   "several"  . EYE SURGERY Left   . HERNIA REPAIR    . JOINT REPLACEMENT    . MASS EXCISION Right 01/26/2015   Procedure: REMOVAL OF RIGHT CHEST WALL MASS;  Surgeon: Jackolyn Confer, MD;  Location: [facility];  Service: General;   Laterality: Right;  . MULTIPLE TOOTH EXTRACTIONS    . POLYPECTOMY    . SHOULDER SURGERY Right 2000s   "severed muscle years beforee; broke collar bone; dr repaired both at the same time"  . TONSILLECTOMY    . TOTAL KNEE ARTHROPLASTY Left 04/10/2016   Procedure: LEFT TOTAL KNEE ARTHROPLASTY;  Surgeon: Ninetta Lights, MD;  Location: [facility];  Service: Orthopedics;  Laterality: Left;  LEFT TOTAL KNEE ARTHROPLASTY  . TOTAL KNEE ARTHROPLASTY Right 08/14/2016  . TOTAL KNEE ARTHROPLASTY Right 08/14/2016   Procedure: RIGHT TOTAL KNEE ARTHROPLASTY;  Surgeon: Ninetta Lights, MD;  Location: [facility];  Service: Orthopedics;  Laterality: Right;    Social History   Tobacco Use  Smoking Status Former Smoker  . Packs/day: 20.00  . Years: 0.50  . Pack years: 10.00  . Types: Cigarettes  . Quit date: 04/29/1983  . Years since quitting: 35.6  Smokeless Tobacco Never Used    Social History   Substance and Sexual Activity  Alcohol Use Yes  . Alcohol/week: 4.0 standard drinks  . Types: 4 Standard drinks or equivalent per week    Family History  Problem Relation Age of Onset  . Heart disease Father   . Colon cancer Maternal Grandfather   . Heart disease Paternal Grandmother     Review of Systems: As noted in HPI.  All other systems were reviewed and are negative.  Physical Exam: BP (!) 148/88   Pulse 78   Temp 98.6 F (37 C) (Temporal)   Ht 5\' 10"  (1.778 m)   Wt 251 lb 3.2 oz (113.9 kg)   SpO2 93%   BMI 36.04 kg/m  GENERAL:  Well appearing WM in NAD. obese HEENT:  PERRL, EOMI, sclera are clear. Oropharynx is clear. NECK:  No jugular venous distention, carotid upstroke brisk and symmetric, no bruits, no thyromegaly or adenopathy LUNGS:  Clear to auscultation bilaterally CHEST:  Unremarkable HEART:  RRR,  PMI not displaced or sustained,S1 and S2 within normal limits, no S3, no S4: no clicks, no rubs, no murmurs ABD:  Soft, nontender. BS +, no masses or bruits. No hepatomegaly, no  splenomegaly EXT:  2 + pulses throughout, no edema, no cyanosis no clubbing SKIN:  Warm and dry.  No rashes NEURO:  Alert and oriented x 3. Cranial nerves II  through XII intact. PSYCH:  Cognitively intact    LABORATORY DATA: Ecg shows normal sinus rhythm with rate 78. First degree AV block.  Otherwise normal.   I have personally reviewed and interpreted this study.  Lab Results  Component Value Date   WBC 7.2 01/26/2018   HGB 15.1 01/26/2018   HCT 44.9 01/26/2018   PLT 342 01/26/2018   GLUCOSE 86 06/24/2018   CHOL 198 01/26/2018   TRIG 145 01/26/2018   HDL 57 01/26/2018   LDLCALC 112 (H) 01/26/2018   ALT 16 01/26/2018   AST 16 01/26/2018   NA 142 06/24/2018   K 4.2 06/24/2018   CL 102 06/24/2018   CREATININE 1.19 06/24/2018   BUN 23 06/24/2018   CO2 26 06/24/2018   TSH 4.090 01/26/2018   INR 1.04 03/29/2016   HGBA1C 5.8 (H) 06/24/2018    Labs reviewed from the [place] in 09/26/15: CBC, CMET, TSH all normal. A1c 5.6%. Cholesterol 226, trig 113, HDL 76, LDL 122.   Myoview 03/26/17: Study Highlights     The left ventricular ejection fraction is normal (55-65%).  Nuclear stress EF: 61%.  There was no ST segment deviation noted during stress.  Defect 1: There is a small defect of moderate severity present in the apex location.  This is a low risk study.   Low risk stress nuclear study with apical thinning but no ischemia; EF 61 with normal wall motion.      Assessment / Plan: 1. Hypertension. Mildly elevated today but reviewed readings over the past year and control appears good.   2. Hyperlipidemia.  Intolerant to statins in the past. Continue Zetia and fenofibrate 160 mg daily. continue fish oil.   3. Atypical chest pain but with  risk factors. Myoview in October 2018 was low risk. Normal EF.   4. Obesity. Encouraged increased aerobic activity and Carbohydrate restriction.  5. PACs. Benign.  I will follow up in 6 months.

## 2018-12-23 ENCOUNTER — Ambulatory Visit (INDEPENDENT_AMBULATORY_CARE_PROVIDER_SITE_OTHER): Payer: PPO | Admitting: Cardiology

## 2018-12-23 ENCOUNTER — Encounter: Payer: Self-pay | Admitting: Cardiology

## 2018-12-23 ENCOUNTER — Other Ambulatory Visit: Payer: Self-pay

## 2018-12-23 VITALS — BP 148/88 | HR 78 | Temp 98.6°F | Ht 70.0 in | Wt 251.2 lb

## 2018-12-23 DIAGNOSIS — E782 Mixed hyperlipidemia: Secondary | ICD-10-CM

## 2018-12-23 DIAGNOSIS — I10 Essential (primary) hypertension: Secondary | ICD-10-CM | POA: Diagnosis not present

## 2018-12-24 ENCOUNTER — Ambulatory Visit (INDEPENDENT_AMBULATORY_CARE_PROVIDER_SITE_OTHER): Payer: PPO | Admitting: Neurology

## 2018-12-24 ENCOUNTER — Encounter: Payer: Self-pay | Admitting: Neurology

## 2018-12-24 VITALS — BP 166/98 | HR 75 | Ht 70.0 in | Wt 252.0 lb

## 2018-12-24 DIAGNOSIS — R419 Unspecified symptoms and signs involving cognitive functions and awareness: Secondary | ICD-10-CM | POA: Diagnosis not present

## 2018-12-24 DIAGNOSIS — R251 Tremor, unspecified: Secondary | ICD-10-CM | POA: Diagnosis not present

## 2018-12-24 NOTE — Patient Instructions (Addendum)
You have a rather mild and intermittent tremor of both hands. Your brain MRI Showed age-appropriate findings, your exam is stable, again, I do not see any signs or symptoms of parkinson's like disease or what we call parkinsonism.   For your tremor, I would not recommend any new medications.  Please talk to your Primary care physician about anxiety management and stress management.   Please remember, that any kind of tremor may be exacerbated by anxiety, anger, nervousness, excitement, dehydration, sleep deprivation, by caffeine, and low blood sugar values or blood sugar fluctuations, thyroid dysfunction.   For your difficulty with processing information and with numbers and your cognitive complaints, we will request a formal neuropsychological test (aka cognitive testing). This requires a referral to a trained and licensed neuropsychologist and will be a separate appointment at a different clinic.   We will consider a DaT scan: This is a specialized brain scan designed to help with diagnosis of tremor disorders. A radioactive marker gets injected and the uptake is measured in the brain and compared to normal controls and right side is compared to the left, a change in uptake can help with diagnosis of certain tremor disorders. A brain MRI on the other hand is a brain scan that helps look at the brain structure in more detail overall and look for age-related changes, blood vessel related changes and look for stroke and volume loss which we call atrophy.
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
## 2019-01-25 DIAGNOSIS — N401 Enlarged prostate with lower urinary tract symptoms: Secondary | ICD-10-CM | POA: Diagnosis not present

## 2019-01-25 DIAGNOSIS — R972 Elevated prostate specific antigen [PSA]: Secondary | ICD-10-CM | POA: Diagnosis not present

## 2019-01-28 DIAGNOSIS — Z8042 Family history of malignant neoplasm of prostate: Secondary | ICD-10-CM | POA: Diagnosis not present

## 2019-01-28 DIAGNOSIS — N529 Male erectile dysfunction, unspecified: Secondary | ICD-10-CM | POA: Diagnosis not present

## 2019-01-28 DIAGNOSIS — R972 Elevated prostate specific antigen [PSA]: Secondary | ICD-10-CM | POA: Diagnosis not present

## 2019-01-28 DIAGNOSIS — N401 Enlarged prostate with lower urinary tract symptoms: Secondary | ICD-10-CM | POA: Diagnosis not present

## 2019-02-16 ENCOUNTER — Other Ambulatory Visit: Payer: Self-pay | Admitting: Cardiology

## 2019-02-16 DIAGNOSIS — R079 Chest pain, unspecified: Secondary | ICD-10-CM

## 2019-02-16 DIAGNOSIS — E782 Mixed hyperlipidemia: Secondary | ICD-10-CM

## 2019-02-16 DIAGNOSIS — I10 Essential (primary) hypertension: Secondary | ICD-10-CM

## 2019-03-15 DIAGNOSIS — N401 Enlarged prostate with lower urinary tract symptoms: Secondary | ICD-10-CM | POA: Diagnosis not present

## 2019-03-15 DIAGNOSIS — R972 Elevated prostate specific antigen [PSA]: Secondary | ICD-10-CM | POA: Diagnosis not present

## 2019-03-15 DIAGNOSIS — Z8042 Family history of malignant neoplasm of prostate: Secondary | ICD-10-CM | POA: Diagnosis not present

## 2019-04-06 ENCOUNTER — Other Ambulatory Visit: Payer: Self-pay

## 2019-04-06 ENCOUNTER — Encounter: Payer: Self-pay | Admitting: Internal Medicine

## 2019-04-06 ENCOUNTER — Ambulatory Visit (INDEPENDENT_AMBULATORY_CARE_PROVIDER_SITE_OTHER): Payer: PPO | Admitting: Family Medicine

## 2019-04-06 ENCOUNTER — Encounter: Payer: Self-pay | Admitting: Family Medicine

## 2019-04-06 VITALS — BP 134/78 | HR 76 | Temp 97.6°F | Ht 70.0 in | Wt 242.0 lb

## 2019-04-06 DIAGNOSIS — R7302 Impaired glucose tolerance (oral): Secondary | ICD-10-CM

## 2019-04-06 DIAGNOSIS — I10 Essential (primary) hypertension: Secondary | ICD-10-CM | POA: Diagnosis not present

## 2019-04-06 DIAGNOSIS — Z23 Encounter for immunization: Secondary | ICD-10-CM

## 2019-04-06 DIAGNOSIS — Z1211 Encounter for screening for malignant neoplasm of colon: Secondary | ICD-10-CM

## 2019-04-06 DIAGNOSIS — Z0000 Encounter for general adult medical examination without abnormal findings: Secondary | ICD-10-CM

## 2019-04-06 DIAGNOSIS — E559 Vitamin D deficiency, unspecified: Secondary | ICD-10-CM

## 2019-04-06 DIAGNOSIS — R413 Other amnesia: Secondary | ICD-10-CM | POA: Diagnosis not present

## 2019-04-06 DIAGNOSIS — E782 Mixed hyperlipidemia: Secondary | ICD-10-CM | POA: Diagnosis not present

## 2019-04-06 DIAGNOSIS — H9193 Unspecified hearing loss, bilateral: Secondary | ICD-10-CM | POA: Diagnosis not present

## 2019-04-06 DIAGNOSIS — R7303 Prediabetes: Secondary | ICD-10-CM

## 2019-04-06 DIAGNOSIS — N1832 Chronic kidney disease, stage 3b: Secondary | ICD-10-CM | POA: Diagnosis not present

## 2019-04-06 DIAGNOSIS — Z0001 Encounter for general adult medical examination with abnormal findings: Secondary | ICD-10-CM | POA: Diagnosis not present

## 2019-04-06 DIAGNOSIS — D126 Benign neoplasm of colon, unspecified: Secondary | ICD-10-CM | POA: Diagnosis not present

## 2019-04-06 NOTE — Progress Notes (Signed)
Subjective:   [name] is a 73 y.o. male who presents for Medicare Annual/Subsequent preventive examination.  Regarding his difficulty with soft or whispering voices, patient makes a joke about his hearing, and states "I have concerns about it, but the doctors say that I don't need hearing aids, and that hearing aids will not help my hearing." Says one doctor "broke it down to I miss vowels; there's a certain frequency in vowels and I'm missing those frequencies, but it's not called deafness, it's something else." Says he hears a good amount, but if someone wants to whisper so I can't hear them, then I won't be hearing them." "If I tell people to speak up a normal volume, I'm good." Says he tries to walk more and more; "we're on a kick of walking in the morning now that it's not so hot, or we walk on the treadmill we have." Tries to walk 10-20 minutes each time he exercises. Says he's busy all day, "but that's not much cardio since I don't go much distance." Notes his work would "wear anybody out." While walking, his hip sometimes hurts and keeps him from walking, "but I think if I make it a habit, I think I can push through that [pain] and walk like anybody else." States often has to sit down and let his hip calm down.  Says he was told in the past that his hip pain is a muscle spasm. Regarding his weight concerns, notes since retirement, he was sleeping sometimes until 10:30 AM. Says "that's where the weight is." Notes "I'm not up moving in the morning; that's a lot of hours in the week." Says now they're getting up no later than 7:30, and "just getting moving."   Patient states he misplaces things; "of course." Says "I'm 73, and it bothers me when I can't remember something. The things that I can't remember are ... 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 but if you put me in a hospital, I lose where I am." Per patient, just called neurology yesterday and cancelled his upcoming appointment. Says he doesn't think his memory concerns are great enough to be "worth all that."     Objective:    Vitals: BP 134/78 (BP Location: Left Arm, Patient Position: Sitting, Cuff Size: Large)   Pulse 76   Temp 97.6 F (36.4 C) (Oral)   Ht 5\' 10"  (1.778 m)   Wt 242 lb (109.8 kg)   BMI 34.72 kg/m   Body mass index is 34.72 kg/m.  Advanced Directives 08/14/2016 08/05/2016 04/10/2016 03/29/2016 01/26/2015  Does Patient Have a Medical Advance Directive? Yes Yes No No Yes  Type of Paramedic of [place];Living will [place];Living will - - [place];Living will  Does patient want to make changes to medical advance directive? No - Patient declined No -  Patient declined - - No - Patient declined  Copy of Tea in Chart? Yes Yes - - No - copy requested  Would patient like information on creating a medical advance directive? - - No - patient declined information Yes - Educational materials given -    Tobacco Social History   Tobacco Use  Smoking Status Former Smoker  . Packs/day: 20.00  . Years: 0.50  . Pack years: 10.00  . Types: Cigarettes  . Quit date: 04/29/1983  . Years since quitting: 35.9  Smokeless Tobacco Never Used     Counseling given: Not Answered   Past Medical History:  Diagnosis Date  . Asthma    as a child  . Colon polyps   . Depression   . Dyspnea   .  Epigastric pain   . Hard of hearing   . Headache    migraines  . History of bronchitis   . History of pneumonia   . Hyperlipidemia   . Hypertension   . Knee pain   . Nausea    Occasional  . Osteoarthritis   . Sigmoid diverticulosis   . Wears glasses    Past Surgical History:  Procedure Laterality Date  . APPENDECTOMY    . BACK SURGERY    . COLONOSCOPY  11/06/2004   "several"  . EYE SURGERY Left   . HERNIA REPAIR    . JOINT REPLACEMENT    . MASS EXCISION Right 01/26/2015   Procedure: REMOVAL OF RIGHT CHEST WALL MASS;  Surgeon: Jackolyn Confer, MD;  Location: [facility];  Service: General;  Laterality: Right;  . MULTIPLE TOOTH EXTRACTIONS    . POLYPECTOMY    . SHOULDER SURGERY Right 2000s   "severed muscle years beforee; broke collar bone; dr repaired both at the same time"  . TONSILLECTOMY    . TOTAL KNEE ARTHROPLASTY Left 04/10/2016   Procedure: LEFT TOTAL KNEE ARTHROPLASTY;  Surgeon: Ninetta Lights, MD;  Location: [facility];  Service: Orthopedics;  Laterality: Left;  LEFT TOTAL KNEE ARTHROPLASTY  . TOTAL KNEE ARTHROPLASTY Right 08/14/2016  . TOTAL KNEE ARTHROPLASTY Right 08/14/2016   Procedure: RIGHT TOTAL KNEE ARTHROPLASTY;  Surgeon: Ninetta Lights, MD;  Location: [facility];  Service: Orthopedics;  Laterality: Right;   Family History  Problem Relation Age of Onset  . Heart disease Father   . Colon cancer Maternal Grandfather   . Heart disease Paternal Grandmother    Social History   Socioeconomic History  . Marital status: Married    Spouse name: Not on file  . Number of children: Not on file  . Years of education: Not on file  . Highest education level: Not on file  Occupational History  . Not on file  Social Needs  . Financial resource strain: Not on file  . Food insecurity    Worry: Not on file    Inability: Not on file  . Transportation needs    Medical: Not on file    Non-medical: Not on file  Tobacco Use  . Smoking status: Former Smoker     Packs/day: 20.00    Years: 0.50    Pack years: 10.00    Types: Cigarettes    Quit date: 04/29/1983    Years since quitting: 35.9  . Smokeless tobacco: Never Used  Substance and Sexual Activity  . Alcohol use: Yes    Alcohol/week: 4.0 standard drinks    Types: 4 Standard drinks or equivalent per week  . Drug  use: No  . Sexual activity: Not Currently  Lifestyle  . Physical activity    Days per week: Not on file    Minutes per session: Not on file  . Stress: Not on file  Relationships  . Social Herbalist on phone: Not on file    Gets together: Not on file    Attends religious service: Not on file    Active member of club or organization: Not on file    Attends meetings of clubs or organizations: Not on file    Relationship status: Not on file  Other Topics Concern  . Not on file  Social History Narrative  . Not on file    Outpatient Encounter Medications as of 04/06/2019  Medication Sig  . aspirin EC 81 MG tablet Take 81 mg by mouth daily.  . benazepril (LOTENSIN) 20 MG tablet TAKE 1 & 1/2 (ONE & ONE-HALF) TABLETS BY MOUTH ONCE DAILY (Patient taking differently: 30 mg. )  . Calcium Carbonate-Vitamin D 600-400 MG-UNIT tablet Take 1 tablet by mouth 2 (two) times daily.  . celecoxib (CELEBREX) 200 MG capsule Take 1 capsule by mouth 2 (two) times daily.   . Choline Fenofibrate (FENOFIBRIC ACID) 135 MG CPDR Take 135 mg by mouth daily.  Marland Kitchen CINNAMON PO Take 1 capsule by mouth 2 (two) times daily.   Marland Kitchen ezetimibe (ZETIA) 10 MG tablet Take 1 tablet (10 mg total) by mouth daily. (Patient taking differently: Take 10 mg by mouth at bedtime. )  . Garlic 123XX123 MG CAPS Take 1,000 mg by mouth 2 (two) times a week.  . loratadine (CLARITIN) 10 MG tablet Take 10 mg by mouth daily.  . Multiple Vitamin (MULTIVITAMIN) capsule Take by mouth.  . vitamin B-12 (CYANOCOBALAMIN) 100 MCG tablet Take by mouth.  . Vitamin D, Ergocalciferol, (DRISDOL) 50000 units CAPS capsule Take 1 capsule (50,000  Units total) by mouth every 7 (seven) days.   No facility-administered encounter medications on file as of 04/06/2019.     Activities of Daily Living In your present state of health, do you have any difficulty performing the following activities: 04/06/2019  Hearing? N  Vision? N  Difficulty concentrating or making decisions? N  Walking or climbing stairs? N  Dressing or bathing? N  Doing errands, shopping? N  Some recent data might be hidden    Patient Care Team: Mellody Dance, DO as PCP - General (Family Medicine) Martinique, Peter M, MD as Consulting Physician (Cardiology) Renette Butters, MD as Attending Physician (Orthopedic Surgery) Irene Shipper, MD as Consulting Physician (Gastroenterology) Myrlene Broker, MD as Attending Physician (Urology) Marilynne Halsted, MD as Referring Physician (Ophthalmology) Administration, Reather Laurence, MD as Attending Physician (Neurology) Izora Gala, MD as Consulting Physician (Otolaryngology)      Assessment:   This is a routine wellness examination for Robertt.  Exercise Activities and Dietary recommendations  no exercise regularly;  Not meeting AHA guidelines. - has hip pains also- seeing Dr Percell Miller of ortho.    Fall Risk Fall Risk  04/06/2019 06/24/2018 02/02/2018 10/28/2017  Falls in the past year? 0 0 No No  Number falls in past yr: 0 - - -  Injury with Fall? 0 - - -  Follow up - Falls evaluation completed - -   Is the patient's home free of loose throw rugs in walkways, pet beds, electrical cords, etc?   no      Grab bars in the bathroom? yes  Handrails on the stairs?   yes      Adequate lighting?   yes  Timed Get Up and Go Performed: over phone   Depression Screen PHQ 2/9 Scores 04/06/2019 10/26/2018 06/24/2018 02/02/2018  PHQ - 2 Score 0 0 0 0  PHQ- 9 Score 0 0 1 0    Cognitive Function   6CIT Screen 04/06/2019  What Year? 0 points  What month? 0 points  What time? 0 points  Count back from 20  0 points  Months in reverse 0 points  Repeat phrase 0 points  Total Score 0    Immunization History  Administered Date(s) Administered  . Influenza,inj,quad, With Preservative 03/14/2017, 03/05/2018, 02/24/2019  . Influenza-Unspecified 03/05/2018  . Pneumococcal Polysaccharide-23 07/19/2013  . Tdap 08/22/2008    Qualifies for Shingles Vaccine?  No- done already at [facility] Patient confirms Shingrix:  1st shot 09/03/16;   11/30/16   01/05/16-patient had Tdap done at that time at [facility] in [place]  Had both PCV 13 and 23 - 01/05/16- at [facility] in [place]  Had CT scan lungs and AAA screen at [facility] as well in 2017 or so per pt   Screening Tests Health Maintenance  Topic Date Due  . COLONOSCOPY  10/26/2019 (Originally 07/23/2018)  . TETANUS/TDAP  11/03/2019 (Originally 08/23/2018)  . Hepatitis C Screening  11/03/2019 (Originally [birth year])  . PNA vac Low Risk Adult (2 of 2 - PCV13) 11/03/2019 (Originally 07/19/2014)  . INFLUENZA VACCINE  Completed   Cancer Screenings: Lung: Low Dose CT Chest recommended if Age 43-80 years, 30 pack-year currently smoking OR have quit w/in 15years. Patient does not qualify. Colorectal: Needed  Additional Screenings: Hepatitis C Screening:  Screened at [facility]:   PLAN: - Patient knows to address all Orthopedic concerns with Dr. Percell Miller.   Weight concerns:- Advised patient to watch what he eats, adopt a more prudent diet, and engage in frequent physical activity. - Patient declines referral to nutritionist today.   - Per patient, has obtained shingles vaccine in the past at [facility], two shots. - Per patient, obtained shingles vaccines (Shingrix) September 03 2016, and June 23rd of 2018. - Advised patient of need to obtain this information from the pharmacy. - Per patient, had TDAP done in July of 2017, at the [facility] in [place]. - Per patient, had pneumonia vaccine January 05 2016, through the [facility].  Per patient, has obtained a AAA  ultrasound of the abdomen in the past, through the [facility]. PLAN: - Colonoscopy ordered today. - Advised that due to patient age, this may be patient's final colonoscopy.  PLAN: In regards to patient's concerns about memory  - Encouraged patient to follow-up with Neurology, and call and reschedule his appointment given his concerns. - Lengthy discussion held with patient today. Education provided and all questions answered.  Follow-up: - Patient will return for blood work as advised. - Otherwise, advised patient to follow-up OV in three months for chronic health maintenance. - Advised patient to call front desk and make follow-up OV for lab work as discussed.    Orders Placed This Encounter  Procedures        . CBC w/Diff  . Comprehensive metabolic panel  . Hemoglobin A1c  . Lipid panel  . TSH  . VITAMIN D 25 Hydroxy (Vit-D Deficiency, Fractures)  . T4, free  . T3   . Ambulatory referral to Gastroenterology- colonoscopy 2015- due q 5 yrs due to family  hx.    I have personally reviewed and noted the following in the patient's chart:   . Medical and social history . Use of alcohol, tobacco or illicit drugs  . Current medications and supplements . Functional ability and status . Nutritional status . Physical activity . Advanced directives . List of other physicians . Hospitalizations, surgeries, and ER visits in previous 12 months . Vitals . Screenings to include cognitive, depression, and falls . Referrals and appointments  In addition, I have reviewed and discussed with patient certain preventive protocols, quality metrics, and best practice recommendations. A written personalized care plan for preventive services as well as general preventive health recommendations were provided to patient.    Mellody Dance, DO  04/06/2019

## 2019-04-13 ENCOUNTER — Encounter: Payer: Self-pay | Admitting: Family Medicine

## 2019-04-15 ENCOUNTER — Encounter: Payer: PPO | Admitting: Psychology

## 2019-04-29 DIAGNOSIS — R590 Localized enlarged lymph nodes: Secondary | ICD-10-CM | POA: Insufficient documentation

## 2019-05-03 DIAGNOSIS — N401 Enlarged prostate with lower urinary tract symptoms: Secondary | ICD-10-CM | POA: Diagnosis not present

## 2019-05-03 DIAGNOSIS — Z8042 Family history of malignant neoplasm of prostate: Secondary | ICD-10-CM | POA: Diagnosis not present

## 2019-05-03 DIAGNOSIS — N529 Male erectile dysfunction, unspecified: Secondary | ICD-10-CM | POA: Diagnosis not present

## 2019-05-10 ENCOUNTER — Encounter: Payer: Self-pay | Admitting: Internal Medicine

## 2019-05-10 ENCOUNTER — Other Ambulatory Visit: Payer: Self-pay

## 2019-05-10 ENCOUNTER — Ambulatory Visit: Payer: PPO

## 2019-05-10 VITALS — Temp 96.8°F | Ht 70.0 in | Wt 237.0 lb

## 2019-05-10 DIAGNOSIS — Z86010 Personal history of colonic polyps: Secondary | ICD-10-CM

## 2019-05-10 MED ORDER — NA SULFATE-K SULFATE-MG SULF 17.5-3.13-1.6 GM/177ML PO SOLN: 1.0000 | Freq: Once | ORAL | 0 refills | Status: AC

## 2019-05-10 NOTE — Progress Notes (Signed)
Denies allergies to eggs or soy products. Denies complication of anesthesia or sedation. Denies use of weight loss medication. Denies use of O2.   Emmi instructions given for colonoscopy.  Covid screening is scheduled for Wednesday 05/12/19 @ 11:40 Am. A sample of Suprep was given to the patient.

## 2019-05-12 ENCOUNTER — Ambulatory Visit (INDEPENDENT_AMBULATORY_CARE_PROVIDER_SITE_OTHER): Payer: PPO

## 2019-05-12 DIAGNOSIS — Z1159 Encounter for screening for other viral diseases: Secondary | ICD-10-CM | POA: Diagnosis not present

## 2019-05-13 LAB — SARS CORONAVIRUS 2 (TAT 6-24 HRS): SARS Coronavirus 2: NEGATIVE

## 2019-05-14 ENCOUNTER — Encounter: Payer: Self-pay | Admitting: Internal Medicine

## 2019-05-14 ENCOUNTER — Ambulatory Visit (AMBULATORY_SURGERY_CENTER): Payer: PPO | Admitting: Internal Medicine

## 2019-05-14 ENCOUNTER — Other Ambulatory Visit: Payer: Self-pay

## 2019-05-14 VITALS — BP 141/93 | HR 65 | Temp 97.9°F | Resp 16 | Ht 70.0 in | Wt 237.0 lb

## 2019-05-14 DIAGNOSIS — Z86010 Personal history of colonic polyps: Secondary | ICD-10-CM | POA: Diagnosis not present

## 2019-05-14 DIAGNOSIS — I10 Essential (primary) hypertension: Secondary | ICD-10-CM | POA: Diagnosis not present

## 2019-05-14 DIAGNOSIS — D122 Benign neoplasm of ascending colon: Secondary | ICD-10-CM

## 2019-05-14 DIAGNOSIS — D123 Benign neoplasm of transverse colon: Secondary | ICD-10-CM | POA: Diagnosis not present

## 2019-05-14 DIAGNOSIS — D120 Benign neoplasm of cecum: Secondary | ICD-10-CM

## 2019-05-14 DIAGNOSIS — Z1211 Encounter for screening for malignant neoplasm of colon: Secondary | ICD-10-CM | POA: Diagnosis not present

## 2019-05-14 MED ORDER — SODIUM CHLORIDE 0.9 % IV SOLN: 500.0000 mL | Freq: Once | INTRAVENOUS | Status: DC

## 2019-05-14 NOTE — Op Note (Signed)
[facility] Patient Name: [name] Procedure Date: 05/14/2019 9:04 AM MRN: [id number] Endoscopist: Docia Chuck. [name] , MD Age: 73 Referring MD:  Date of Birth: 01-05-1946 Gender: Male Account #: [id number] Procedure:                Colonoscopy with cold snare polypectomy x 3 Indications:              High risk colon cancer surveillance: Personal                            history of multiple (3 or more) adenomas. Previous                            examinations 2004, 2006, 2012, 2015. Grandfather                            with colon cancer Medicines:                Monitored Anesthesia Care Procedure:                Pre-Anesthesia Assessment:                           - Prior to the procedure, a History and Physical                            was performed, and patient medications and                            allergies were reviewed. The patient's tolerance of                            previous anesthesia was also reviewed. The risks                            and benefits of the procedure and the sedation                            options and risks were discussed with the patient.                            All questions were answered, and informed consent                            was obtained. Prior Anticoagulants: The patient has                            taken no previous anticoagulant or antiplatelet                            agents. ASA Grade Assessment: II - A patient with                            mild systemic disease. After reviewing the risks  and benefits, the patient was deemed in                            satisfactory condition to undergo the procedure.                           After obtaining informed consent, the colonoscope                            was passed under direct vision. Throughout the                            procedure, the patient's blood pressure, pulse, and                            oxygen saturations  were monitored continuously. The                            Colonoscope was introduced through the anus and                            advanced to the the cecum, identified by                            appendiceal orifice and ileocecal valve. The                            ileocecal valve, appendiceal orifice, and rectum                            were photographed. The quality of the bowel                            preparation was excellent. The colonoscopy was                            performed without difficulty. The patient tolerated                            the procedure well. The bowel preparation used was                            SUPREP via split dose instruction. Scope In: 9:21:51 AM Scope Out: 9:35:33 AM Scope Withdrawal Time: 0 hours 10 minutes 33 seconds  Total Procedure Duration: 0 hours 13 minutes 42 seconds  Findings:                 Three polyps were found in the transverse colon,                            ascending colon and cecum. The polyps were 2 to 4                            mm in size. These polyps were removed with  a cold                            snare. Resection and retrieval were complete.                           Multiple diverticula were found in the left colon.                           Hemorrhoids were found during retroflexion.                           The exam was otherwise without abnormality on                            direct and retroflexion views. Complications:            No immediate complications. Estimated blood loss:                            None. Estimated Blood Loss:     Estimated blood loss: none. Impression:               - Three 2 to 4 mm polyps in the transverse colon,                            in the ascending colon and in the cecum, removed                            with a cold snare. Resected and retrieved.                           - Diverticulosis in the left colon.                           - Hemorrhoids.                            - The examination was otherwise normal on direct                            and retroflexion views. Recommendation:           - Repeat colonoscopy in 5 years for surveillance.                           - Patient has a contact number available for                            emergencies. The signs and symptoms of potential                            delayed complications were discussed with the                            patient. Return to normal activities tomorrow.  Written discharge instructions were provided to the                            patient.                           - Resume previous diet.                           - Continue present medications.                           - Await pathology results. Docia Chuck. [name], MD 05/14/2019 9:39:59 AM This report has been signed electronically.

## 2019-05-14 NOTE — Progress Notes (Signed)
VS-[place] Temp- JB  Pt's states no medical or surgical changes since previsit or office visit.

## 2019-05-14 NOTE — Patient Instructions (Signed)
Thank you for allowing [place] to care for you today!  Await pathology results by mail, approximately 2 weeks.  Recommend next surveillance colonoscopy in 5 years.  Resume previous diet and medications today.  Return to your normal activities tomorrow.  YOU HAD AN ENDOSCOPIC PROCEDURE TODAY AT [facility]:   Refer to the procedure report that was given to you for any specific questions about what was found during the examination.  If the procedure report does not answer your questions, please call your gastroenterologist to clarify.  If you requested that your care partner not be given the details of your procedure findings, then the procedure report has been included in a sealed envelope for you to review at your convenience later.  YOU SHOULD EXPECT: Some feelings of bloating in the abdomen. Passage of more gas than usual.  Walking can help get rid of the air that was put into your GI tract during the procedure and reduce the bloating. If you had a lower endoscopy (such as a colonoscopy or flexible sigmoidoscopy) you may notice spotting of blood in your stool or on the toilet paper. If you underwent a bowel prep for your procedure, you may not have a normal bowel movement for a few days.  Please Note:  You might notice some irritation and congestion in your nose or some drainage.  This is from the oxygen used during your procedure.  There is no need for concern and it should clear up in a day or so.  SYMPTOMS TO REPORT IMMEDIATELY:   Following lower endoscopy (colonoscopy or flexible sigmoidoscopy):  Excessive amounts of blood in the stool  Significant tenderness or worsening of abdominal pains  Swelling of the abdomen that is new, acute  Fever of 100F or higher   For urgent or emergent issues, a gastroenterologist can be reached at any hour by calling [phone].   DIET:  We do recommend a small meal at first, but then you may proceed to your regular diet.  Drink plenty  of fluids but you should avoid alcoholic beverages for 24 hours.  ACTIVITY:  You should plan to take it easy for the rest of today and you should NOT DRIVE or use heavy machinery until tomorrow (because of the sedation medicines used during the test).    FOLLOW UP: Our staff will call the number listed on your records 48-72 hours following your procedure to check on you and address any questions or concerns that you may have regarding the information given to you following your procedure. If we do not reach you, we will leave a message.  We will attempt to reach you two times.  During this call, we will ask if you have developed any symptoms of COVID 19. If you develop any symptoms (ie: fever, flu-like symptoms, shortness of breath, cough etc.) before then, please call [phone].  If you test positive for Covid 19 in the 2 weeks post procedure, please call and report this information to [place].    If any biopsies were taken you will be contacted by phone or by letter within the next 1-3 weeks.  Please call us at [phone] if you have not heard about the biopsies in 3 weeks.    SIGNATURES/CONFIDENTIALITY: You and/or your care partner have signed paperwork which will be entered into your electronic medical record.  These signatures attest to the fact that that the information above on your After Visit Summary has been reviewed and is understood.  Full responsibility of the confidentiality of this discharge information lies with you and/or your care-partner.

## 2019-05-14 NOTE — Progress Notes (Signed)
Report to PACU, RN, vss, BBS= Clear.  

## 2019-05-15 ENCOUNTER — Other Ambulatory Visit: Payer: Self-pay | Admitting: Family Medicine

## 2019-05-15 DIAGNOSIS — E559 Vitamin D deficiency, unspecified: Secondary | ICD-10-CM

## 2019-05-18 ENCOUNTER — Telehealth: Payer: Self-pay | Admitting: *Deleted

## 2019-05-18 NOTE — Telephone Encounter (Signed)
  Follow up Call-  Call back number 05/14/2019  Post procedure Call Back phone  # [phone]  Permission to leave phone message Yes  Some recent data might be hidden     Patient questions:  Do you have a fever, pain , or abdominal swelling? No. Pain Score  0 *  Have you tolerated food without any problems? Yes.    Have you been able to return to your normal activities? Yes.    Do you have any questions about your discharge instructions: Diet   No. Medications  No. Follow up visit  No.  Do you have questions or concerns about your Care? No.  Actions: * If pain score is 4 or above: 1. No action needed, pain <4.Have you developed a fever since your procedure? no  2.   Have you had an respiratory symptoms (SOB or cough) since your procedure? no  3.   Have you tested positive for COVID 19 since your procedure no  4.   Have you had any family members/close contacts diagnosed with the COVID 19 since your procedure?  no   If yes to any of these questions please route to Joylene John, RN and Alphonsa Gin, Therapist, sports.

## 2019-05-19 ENCOUNTER — Encounter: Payer: Self-pay | Admitting: Internal Medicine

## 2019-06-14 ENCOUNTER — Other Ambulatory Visit: Payer: Self-pay

## 2019-06-14 ENCOUNTER — Ambulatory Visit (INDEPENDENT_AMBULATORY_CARE_PROVIDER_SITE_OTHER): Payer: PPO | Admitting: Family Medicine

## 2019-06-14 ENCOUNTER — Encounter: Payer: Self-pay | Admitting: Family Medicine

## 2019-06-14 VITALS — BP 131/87 | HR 80 | Temp 98.3°F | Ht 70.0 in | Wt 231.0 lb

## 2019-06-14 DIAGNOSIS — E559 Vitamin D deficiency, unspecified: Secondary | ICD-10-CM

## 2019-06-14 DIAGNOSIS — K047 Periapical abscess without sinus: Secondary | ICD-10-CM | POA: Diagnosis not present

## 2019-06-14 DIAGNOSIS — K0500 Acute gingivitis, plaque induced: Secondary | ICD-10-CM | POA: Diagnosis not present

## 2019-06-14 DIAGNOSIS — I10 Essential (primary) hypertension: Secondary | ICD-10-CM

## 2019-06-14 DIAGNOSIS — Z7189 Other specified counseling: Secondary | ICD-10-CM | POA: Insufficient documentation

## 2019-06-14 MED ORDER — CLINDAMYCIN HCL 300 MG PO CAPS: 300.0000 mg | ORAL_CAPSULE | Freq: Three times a day (TID) | ORAL | 0 refills | Status: DC

## 2019-06-14 MED ORDER — VITAMIN D (ERGOCALCIFEROL) 1.25 MG (50000 UNIT) PO CAPS: ORAL_CAPSULE | ORAL | 3 refills | Status: DC

## 2019-06-14 NOTE — Progress Notes (Addendum)
Virtual / live video office visit note for [employer], D.O- Primary Care Physician at [facility]   I connected with current patient today and beyond visually recognizing the correct individual, I verified that I am speaking with the correct person using two identifiers.  . Location of the patient: Home . Location of the provider: Office Only the patient (+/- their family members at pt's discretion) and myself were participating in the encounter    - This visit type was conducted due to national recommendations for restrictions regarding the COVID-19 Pandemic (e.g. social distancing) in an effort to limit this patient's exposure and mitigate transmission in our community.  This format is felt to be most appropriate for this patient at this time.   - The patient did have access to video technology today  - No physical exam could be performed with this format, beyond that communicated to [place] by the patient/ family members as noted.   - Additionally my office staff/ schedulers discussed with the patient that there may be a monetary charge related to this service, depending on patient's medical insurance.   The patient expressed understanding, and agreed to proceed.      History of Present Illness: No chief complaint on file.   [name], am serving as scribe for Dr. Mellody Dance.  His wife has been in the ICU since Christmas night; notes "she's still unconscious."  She stepped off the stairwell in the dark at 4:30 AM.  - [place] through [facility] for dental concerns.  He had a tooth extracted and a bone graft done, and is having an implant.  Notes he's having 3 done in total, "so they gave me clindamycin, before that each time, so it doesn't get infected."  Says "they require that I take two of those before I come, so I can get ahead of the game."  His last dental procedure was about a month ago.  Notes "when they took the tooth, it  caused a bite problem; it would touch inside my tooth, then glance over to the left and the rear on the left would touch."  Says there's an infection in the upper right side now and he has fever if he doesn't take tylenol, and a swollen/painful area in his right maxillary sinus.  Says his "teeth in the bottom glance off the teeth in the top," "and that made them angry."  Notes "the bite feels good now, but it left with an indention."  Says "the week of Christmas and Christmas Day, I could barely eat because of the upper tooth on the rear, and the other side getting so angry."  Says "it was sensitive to ice water, even tap water felt so cold."  He called his dentist and notes they spoke about his current symptoms.  Patient knows he needs to be assessed by dentist, but states he cannot get back to [facility] until the 11th.  States his fevers go up to around 99.9, not over 100.  Says he's the only person who can see his wife in the hospital, and notes he doesn't want to be turned away due to a fever.  Notes "I don't go anywhere.  I don't want to get anything and take it to [facility] wife] in the hospital."  Since his bite has improved, his pain, soreness, and symptoms have improved.  States he stopped taking the tylenol as often, and notes "that's when I realized  I had the elevation in my temperature."  Then he noticed the "bulge."  Says "on the inside of my gums," there's a squishy area he describes.  Notes his blood pressure at home is 131/87, with a pulse of 80.   Depression screen [facility] 2/9 06/14/2019 04/06/2019 10/26/2018 06/24/2018 02/02/2018  Decreased Interest 2 0 0 0 0  Down, Depressed, Hopeless 0 0 0 0 0  PHQ - 2 Score 2 0 0 0 0  Altered sleeping 1 0 0 0 0  Tired, decreased energy 2 0 0 0 0  Change in appetite 0 0 0 0 0  Feeling bad or failure about yourself  0 0 0 0 0  Trouble concentrating 2 0 0 1 0  Moving slowly or fidgety/restless 0 0 0 0 0  Suicidal thoughts 0 0 0 0 0  PHQ-9 Score 7 0 0 1 0    Difficult doing work/chores Very difficult - Not difficult at all Not difficult at all Not difficult at all    GAD 7 : Generalized Anxiety Score 06/14/2019  Nervous, Anxious, on Edge 0  Control/stop worrying 2  Worry too much - different things 0  Trouble relaxing 0  Restless 0  Easily annoyed or irritable 0  Afraid - awful might happen 0  Total GAD 7 Score 2  Anxiety Difficulty Not difficult at all     Impression and Recommendations:    1. Dental abscess- formed post procedure from Dentist- Conley Canal- at [facility] from Dec 2020   2. Acute gingivitis   3. performed medication counseling   4. Vitamin D insufficiency   5. Essential hypertension     Acute Stress - Wife in Hospital - Patient declines prescription assistance with mood management today. - Patient knows he may call at any time for additional assistance. - pt has no time to be sick, or have fevers and just wants to be able to see his wife. - No time to go to the dentist today/ tomorrow- pt states he needs to be at hosp for his wife.   Vitamin D Insufficiency - Stable at this time. - Patient will continue supplementation as prescribed. - Return for re-check as discussed.   Dental Concerns - Acute Gingivitis, Dental Abscess, Medication Counseling  -Reviewed office visit notes in care everywhere from Dr. Charlann Lange whom patient recently saw early December 2020.  - Per patient, reports symptoms of infection/ possible gum abcess since procedure early December, with significant shift in bite and irritation of the teeth since last procedure done. - Reviewed patient's symptoms extensively. - Discussed true fever with patient today- 100.5 orally is febrile.  - Told patient of critical need to be evaluated by dentist locally as soon as possible.  - Reviewed that with evidence of gingivitis/tooth abscess in the patient's gum, advised patient of possible infection in the gum.    - Discussed that oral antibiotics may be  provided for evidence of infection.  Prescribed Clindamycin 300 mg tablets 3 times per day, for 7 days.  - Reviewed prudent use of antibiotics with patient today. - Told patient to make sure to take every single antibiotic as prescribed.  - Risks and benefits of ongoing antibiotic use discussed extensively.  Counseling for side-effects provided.  Reviewed that C. dificile infection is a higher risk, and can be caused from overuse of these medications.   - Pt assumes risk and wishes to have ABX today even though I rec he seek immediate eval from dentist and txmnt  be provided by them instead.  - Encouraged patient to incorporate probiotics in yogurt etc.  - Recommended that the patient obtain a backup dentist to visit for assessment.   Essential Hypertension - Blood pressure stable at this time. - Continue treatment plan as established. - Will continue to monitor.   Recommendations - Due for fasting lab work near future. - End of January, f/up OV, with blood work 3 days prior.   - As part of my medical decision making, I reviewed the following data within the [place] History obtained from pt /family, CMA notes reviewed and incorporated if applicable, Labs reviewed, Radiograph/ tests reviewed if applicable and OV notes from prior OV's with me, as well as other specialists she/he has seen since seeing me last, were all reviewed and used in my medical decision making process today.   - Additionally, discussion had with patient regarding txmnt plan, their biases about that plan etc were used in my medical decision making today.   - The patient agreed with the plan and demonstrated an understanding of the instructions.   No barriers to understanding were identified.    - Red flag symptoms and signs discussed in detail.  Patient expressed understanding regarding what to do in case of emergency\ urgent symptoms.  The patient was advised to call back or seek an in-person  evaluation if the symptoms worsen or if the condition fails to improve as anticipated.   Return for fasting lab work end of January, with chronic f/up OV 3-4 days later.     Meds ordered this encounter  Medications  . clindamycin (CLEOCIN) 300 MG capsule    Sig: Take 1 capsule (300 mg total) by mouth 3 (three) times daily.    Dispense:  21 capsule    Refill:  0  . Vitamin D, Ergocalciferol, (DRISDOL) 1.25 MG (50000 UT) CAPS capsule    Sig: TAKE 1 CAPSULE BY MOUTH EVERY 7 DAYS    Dispense:  12 capsule    Refill:  3     Note:  This note was prepared with assistance of Dragon voice recognition software. Occasional wrong-word or sound-a-like substitutions may have occurred due to the inherent limitations of voice recognition software.  This document serves as a record of services personally performed by Mellody Dance, DO. It was created on her behalf by Toni Amend, a trained medical scribe. The creation of this record is based on the scribe's personal observations and the provider's statements to them.   This case required medical decision making of at least moderate complexity. The above documentation has been reviewed to be accurate and was completed by Marjory Sneddon, D.O.        Patient Care Team    Relationship Specialty Notifications Start End  Mellody Dance, DO PCP - General Family Medicine  10/28/17   Martinique, Peter M, MD Consulting Physician Cardiology  10/28/17   Renette Butters, MD Attending Physician Orthopedic Surgery  10/28/17   Irene Shipper, MD Consulting Physician Gastroenterology  10/28/17   Myrlene Broker, MD Attending Physician Urology  10/28/17   Marilynne Halsted, MD Referring Physician Ophthalmology  10/28/17   Administration, Veterans    02/18/18   Star Age, MD Attending Physician Neurology  11/03/18    Comment: Cognitive decline, tremor, memory concerns  Izora Gala, MD Consulting Physician Otolaryngology  11/03/18     -Vitals  obtained; medications/ allergies reconciled;  personal medical, social, Sx etc.histories were updated by CMA, reviewed  by me and are reflected in chart  Patient Active Problem List   Diagnosis Date Noted  . Mixed hyperlipidemia 08/25/2007  . Essential hypertension 08/25/2007  . Mood disorder ([place]) 08/25/2007  . GASTROESOPHAGEAL REFLUX DISEASE 08/25/2007  . Benign non-nodular prostatic hyperplasia with lower urinary tract symptoms 04/11/2014  . 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  . performed medication counseling 06/14/2019  . Cervical lymphadenopathy 04/29/2019  . Hearing difficulty, bilateral 04/06/2019  . Prediabetes 11/03/2018  . Laryngopharyngeal reflux (LPR) 06/30/2018  . Memory difficulties 06/24/2018  . Tremors of nervous system 06/24/2018  . Feared complaint without diagnosis- believes he has parkinsons 06/24/2018  . Hypertriglyceridemia 02/02/2018  . obesity (BMI 35.0-39.9) with comorbidity ([place]) 02/02/2018  . Glucose intolerance (impaired glucose tolerance) 02/02/2018  . Vitamin D insufficiency 02/02/2018  . h/o Low serum vitamin B12 02/02/2018  . Chronic kidney disease (CKD) stage G3b/A1, moderately decreased glomerular filtration rate (GFR) between 30-44 mL/min/1.73 square meter and albuminuria creatinine ratio less than 30 mg/g 02/02/2018  . Morbid obesity ([place]) 10/28/2017  . Inactivity 10/28/2017  . Family history of malignant neoplasm of prostate 06/27/2017  . Snores 12/03/2016  . Primary localized osteoarthritis of right knee 08/14/2016  . Primary localized osteoarthritis of left knee 04/10/2016  . History of radial keratotomy 02/05/2016  . Macular puckering, right eye 01/02/2016  . Nuclear sclerotic cataract of right eye 01/02/2016  . Pseudophakia of left eye 01/02/2016  . S/P lumbar fusion 10/24/2014  . Degenerative spondylolisthesis 09/28/2014  . Spinal stenosis of lumbar region 09/14/2014  . Midline low back pain without sciatica 07/18/2014   . DDD (degenerative disc disease), lumbosacral 07/18/2014  . Dyspnea 07/19/2013  . COLONIC POLYPS, ADENOMATOUS 08/25/2007  . EXTERNAL HEMORRHOIDS 08/25/2007  . ASTHMA 08/25/2007  . ESOPHAGITIS 08/25/2007  . GASTRITIS 08/25/2007  . DIVERTICULOSIS OF COLON 08/25/2007  . OSTEOARTHRITIS 08/25/2007  . Sleep apnea 08/25/2007  . PYROSIS 08/25/2007  . EPIGASTRIC PAIN 08/25/2007     Current Meds  Medication Sig  . aspirin EC 81 MG tablet Take 81 mg by mouth daily.  . benazepril (LOTENSIN) 20 MG tablet TAKE 1 & 1/2 (ONE & ONE-HALF) TABLETS BY MOUTH ONCE DAILY (Patient taking differently: 30 mg. )  . Calcium Carbonate-Vitamin D 600-400 MG-UNIT tablet Take 1 tablet by mouth 2 (two) times daily. (Patient taking differently: Take 1 tablet by mouth daily. )  . celecoxib (CELEBREX) 200 MG capsule Take 1 capsule by mouth 2 (two) times daily.   . Choline Fenofibrate (FENOFIBRIC ACID) 135 MG CPDR Take 135 mg by mouth daily.  Marland Kitchen CINNAMON PO Take 1 capsule by mouth 2 (two) times daily.   Marland Kitchen ezetimibe (ZETIA) 10 MG tablet Take 1 tablet (10 mg total) by mouth daily. (Patient taking differently: Take 10 mg by mouth at bedtime. )  . Garlic 123XX123 MG CAPS Take 1,000 mg by mouth 2 (two) times a week.  . Multiple Vitamin (MULTIVITAMIN) capsule Take by mouth.  . vitamin B-12 (CYANOCOBALAMIN) 100 MCG tablet Take by mouth.  . Vitamin D, Ergocalciferol, (DRISDOL) 1.25 MG (50000 UT) CAPS capsule TAKE 1 CAPSULE BY MOUTH EVERY 7 DAYS  . [DISCONTINUED] Vitamin D, Ergocalciferol, (DRISDOL) 1.25 MG (50000 UT) CAPS capsule TAKE 1 CAPSULE BY MOUTH EVERY 7 DAYS     Allergies  Allergen Reactions  . Amoxicillin Hives and Rash    Has patient had a PCN reaction causing immediate rash, facial/tongue/throat swelling, [name]: #  #  #  YES  #  #  #  Has patient had a PCN reaction causing SEVERE RASH INVOLVING MUCUS MEMBRANES or SKIN NECROSIS: #  #  #  YES  #  #  #  Has patient had a PCN reaction  that required hospitalization No Has patient had a PCN reaction occurring within the last 10 years: #  #  #  YES  #  #  #  If all of the above answers are "NO", then may proceed with Cephalosporin use.   . Codeine Other (See Comments)    *  *  Pt does not have allergy to codeine but father had severe reaction to codeine so he never wants to be given to him  *  *  . Toprol Xl [Metoprolol Succinate] Shortness Of Breath  . Atorvastatin Other (See Comments)    Weakness in muscles  . Statins Other (See Comments)    Weakness in muscles respiratory  . Other     intolerant     ROS:  See above HPI for pertinent positives and negatives   Objective:   Blood pressure 131/87, pulse 80, temperature 98.3 F (36.8 C), height 5\' 10"  (1.778 m), weight 231 lb (104.8 kg).  (if some vitals are omitted, this means that patient was UNABLE to obtain them even though they were asked to get them prior to OV today.  They were asked to call us at their earliest convenience with these once obtained.)  General: A & O * 3; visually in no acute distress; in usual state of health.  Skin: Visible skin appears normal and pt's usual skin color HEENT:  EOMI, head is normocephalic and atraumatic.  Sclera are anicteric. Neck has a good range of motion.  Lips are noncyanotic Chest: normal chest excursion and movement Respiratory: speaking in full sentences, no conversational dyspnea; no use of accessory muscles Psych: insight good, mood- appears full

## 2019-06-17 ENCOUNTER — Encounter: Payer: Self-pay | Admitting: Family Medicine

## 2019-06-22 NOTE — Progress Notes (Signed)
[name] Date of Birth: 1945-06-26 Medical Record [id number]  History of Present Illness: [name] is seen for  follow up of HTN and hyperlipidemia. He has multiple drug intolerances. He is doing well on benazepril now.    He had a normal ETT in March 2015. He did undergo L4-5 back surgery in April 2017.  In October 2017 he underwent left TKR and in March 2018 he had a right TKR.  He is followed at the [facility]. Also sees Dr Raliegh Scarlet for primary care.  In 2018 he had atypical chest pain and Myoview was done. It was low risk.  On follow up today he is doing well from his own health standpoint. BP has been well controlled. He has lost weight- about 8 lbs since November. He is walking more. He is upset as his wife has been hospitalized this past month following a fall and closed head injury.  Denies chest pain or SOB.   Current Outpatient Medications on File Prior to Visit  Medication Sig Dispense Refill  . AFLURIA QUADRIVALENT injection     . aspirin EC 81 MG tablet Take 81 mg by mouth daily.    . benazepril (LOTENSIN) 20 MG tablet TAKE 1 & 1/2 (ONE & ONE-HALF) TABLETS BY MOUTH ONCE DAILY (Patient taking differently: 30 mg. ) 135 tablet 1  . celecoxib (CELEBREX) 200 MG capsule Take 1 capsule by mouth 2 (two) times daily.     . Choline Fenofibrate (FENOFIBRIC ACID) 135 MG CPDR Take 135 mg by mouth daily.    Marland Kitchen CINNAMON PO Take 1 capsule by mouth 2 (two) times daily.     Marland Kitchen ezetimibe (ZETIA) 10 MG tablet Take 1 tablet (10 mg total) by mouth daily. (Patient taking differently: Take 10 mg by mouth at bedtime. ) 90 tablet 3  . Garlic 123XX123 MG CAPS Take 1,000 mg by mouth 2 (two) times a week.    . Multiple Vitamin (MULTIVITAMIN) capsule Take by mouth.    . vitamin B-12 (CYANOCOBALAMIN) 100 MCG tablet Take by mouth.    . Vitamin D, Ergocalciferol, (DRISDOL) 1.25 MG (50000 UT) CAPS capsule TAKE 1 CAPSULE BY MOUTH EVERY 7 DAYS 12 capsule 3  . Calcium Carbonate-Vitamin D 600-400 MG-UNIT tablet  Take 1 tablet by mouth 2 (two) times daily. (Patient taking differently: Take 1 tablet by mouth daily. ) 60 tablet 11  . clindamycin (CLEOCIN) 300 MG capsule Take 1 capsule (300 mg total) by mouth 3 (three) times daily. 21 capsule 0   No current facility-administered medications on file prior to visit.    Allergies  Allergen Reactions  . Amoxicillin Hives and Rash    Has patient had a PCN reaction causing immediate rash, facial/tongue/throat swelling, [name]: #  #  #  YES  #  #  #  Has patient had a PCN reaction causing SEVERE RASH INVOLVING MUCUS MEMBRANES or SKIN NECROSIS: #  #  #  YES  #  #  #  Has patient had a PCN reaction that required hospitalization No Has patient had a PCN reaction occurring within the last 10 years: #  #  #  YES  #  #  #  If all of the above answers are "NO", then may proceed with Cephalosporin use.   . Codeine Other (See Comments)    *  *  Pt does not have allergy to codeine but father had severe reaction to codeine so he never wants to be  given to him  *  *  . Toprol Xl [Metoprolol Succinate] Shortness Of Breath  . Atorvastatin Other (See Comments)    Weakness in muscles  . Statins Other (See Comments)    Weakness in muscles respiratory  . Other     intolerant    Past Medical History:  Diagnosis Date  . Allergy   . Asthma    as a child  . Cataract   . Colon polyps   . Depression   . Dyspnea   . Epigastric pain   . Hard of hearing   . Headache    migraines  . Heart murmur   . History of bronchitis   . History of pneumonia   . Hyperlipidemia   . Hypertension   . Knee pain   . Nausea    Occasional  . Osteoarthritis   . Sigmoid diverticulosis   . Wears glasses     Past Surgical History:  Procedure Laterality Date  . APPENDECTOMY    . BACK SURGERY    . COLONOSCOPY  11/06/2004   "several"  . EYE SURGERY Left   . HERNIA REPAIR    . JOINT REPLACEMENT    . MASS EXCISION Right 01/26/2015   Procedure:  REMOVAL OF RIGHT CHEST WALL MASS;  Surgeon: Jackolyn Confer, MD;  Location: [facility];  Service: General;  Laterality: Right;  . MULTIPLE TOOTH EXTRACTIONS    . POLYPECTOMY    . SHOULDER SURGERY Right 2000s   "severed muscle years beforee; broke collar bone; dr repaired both at the same time"  . TONSILLECTOMY    . TOTAL KNEE ARTHROPLASTY Left 04/10/2016   Procedure: LEFT TOTAL KNEE ARTHROPLASTY;  Surgeon: Ninetta Lights, MD;  Location: [place];  Service: Orthopedics;  Laterality: Left;  LEFT TOTAL KNEE ARTHROPLASTY  . TOTAL KNEE ARTHROPLASTY Right 08/14/2016  . TOTAL KNEE ARTHROPLASTY Right 08/14/2016   Procedure: RIGHT TOTAL KNEE ARTHROPLASTY;  Surgeon: Ninetta Lights, MD;  Location: [place];  Service: Orthopedics;  Laterality: Right;    Social History   Tobacco Use  Smoking Status Former Smoker  . Packs/day: 20.00  . Years: 0.50  . Pack years: 10.00  . Types: Cigarettes  . Quit date: 04/29/1983  . Years since quitting: 36.1  Smokeless Tobacco Never Used    Social History   Substance and Sexual Activity  Alcohol Use Yes  . Alcohol/week: 4.0 standard drinks  . Types: 4 Standard drinks or equivalent per week   Comment: Occasionally    Family History  Problem Relation Age of Onset  . Heart disease Father   . Colon cancer Maternal Grandfather   . Heart disease Paternal Grandmother   . Esophageal cancer Neg Hx   . Rectal cancer Neg Hx   . Stomach cancer Neg Hx     Review of Systems: As noted in HPI.  All other systems were reviewed and are negative.  Physical Exam: BP 111/74   Pulse 86   Ht 5\' 10"  (1.778 m)   Wt 229 lb 3.2 oz (104 kg)   SpO2 96%   BMI 32.89 kg/m  GENERAL:  Well appearing WM in NAD. obese HEENT:  PERRL, EOMI, sclera are clear. Oropharynx is clear. NECK:  No jugular venous distention, carotid upstroke brisk and symmetric, no bruits, no thyromegaly or adenopathy LUNGS:  Clear to auscultation bilaterally CHEST:  Unremarkable HEART:   RRR,  PMI not displaced or sustained,S1 and S2 within normal limits, no S3, no S4: no  clicks, no rubs, no murmurs ABD:  Soft, nontender. BS +, no masses or bruits. No hepatomegaly, no splenomegaly EXT:  2 + pulses throughout, no edema, no cyanosis no clubbing SKIN:  Warm and dry.  No rashes NEURO:  Alert and oriented x 3. Cranial nerves II through XII intact. PSYCH:  Cognitively intact    LABORATORY DATA:   Lab Results  Component Value Date   WBC 7.2 01/26/2018   HGB 15.1 01/26/2018   HCT 44.9 01/26/2018   PLT 342 01/26/2018   GLUCOSE 86 06/24/2018   CHOL 198 01/26/2018   TRIG 145 01/26/2018   HDL 57 01/26/2018   LDLCALC 112 (H) 01/26/2018   ALT 16 01/26/2018   AST 16 01/26/2018   NA 142 06/24/2018   K 4.2 06/24/2018   CL 102 06/24/2018   CREATININE 1.19 06/24/2018   BUN 23 06/24/2018   CO2 26 06/24/2018   TSH 4.090 01/26/2018   INR 1.04 03/29/2016   HGBA1C 5.8 (H) 06/24/2018    Labs reviewed from the [place] in 09/26/15: CBC, CMET, TSH all normal. A1c 5.6%. Cholesterol 226, trig 113, HDL 76, LDL 122.   Myoview 03/26/17: Study Highlights     The left ventricular ejection fraction is normal (55-65%).  Nuclear stress EF: 61%.  There was no ST segment deviation noted during stress.  Defect 1: There is a small defect of moderate severity present in the apex location.  This is a low risk study.   Low risk stress nuclear study with apical thinning but no ischemia; EF 61 with normal wall motion.      Assessment / Plan: 1. Hypertension. Well controlled today on benazepril. Continue current therapy.  2. Hyperlipidemia.  Intolerant to statins in the past. Continue Zetia and fenofibrate 160 mg daily. continue fish oil.   3. Atypical chest pain but with  risk factors. Myoview in October 2018 was low risk. Normal EF.   4. Obesity. Encouraged continued weight loss and aerobic activity.  5. PACs. Benign.  I will follow up in 6 months.

## 2019-06-28 ENCOUNTER — Encounter: Payer: Self-pay | Admitting: Cardiology

## 2019-06-28 ENCOUNTER — Encounter (INDEPENDENT_AMBULATORY_CARE_PROVIDER_SITE_OTHER): Payer: Self-pay

## 2019-06-28 ENCOUNTER — Ambulatory Visit: Payer: PPO | Admitting: Cardiology

## 2019-06-28 ENCOUNTER — Other Ambulatory Visit: Payer: Self-pay

## 2019-06-28 VITALS — BP 111/74 | HR 86 | Ht 70.0 in | Wt 229.2 lb

## 2019-06-28 DIAGNOSIS — E782 Mixed hyperlipidemia: Secondary | ICD-10-CM | POA: Diagnosis not present

## 2019-06-28 DIAGNOSIS — I10 Essential (primary) hypertension: Secondary | ICD-10-CM

## 2019-06-28 NOTE — Patient Instructions (Signed)
Medication Instructions:  NO CHANGES *If you need a refill on your cardiac medications before your next appointment, please call your pharmacy*  Lab Work: NONE If you have labs (blood work) drawn today and your tests are completely normal, you will receive your results only by: Marland Kitchen MyChart Message (if you have MyChart) OR . A paper copy in the mail If you have any lab test that is abnormal or we need to change your treatment, we will call you to review the results.   Follow-Up: At [facility], you and your health needs are our priority.  As part of our continuing mission to provide you with exceptional heart care, we have created designated Provider Care Teams.  These Care Teams include your primary Cardiologist (physician) and Advanced Practice Providers (APPs -  Physician Assistants and Nurse Practitioners) who all work together to provide you with the care you need, when you need it.  Your next appointment:   6 month(s)  The format for your next appointment:   In Person  Provider:   Peter Martinique, MD

## 2019-06-30 ENCOUNTER — Ambulatory Visit: Payer: Self-pay | Admitting: Neurology

## 2019-07-05 DIAGNOSIS — N401 Enlarged prostate with lower urinary tract symptoms: Secondary | ICD-10-CM | POA: Diagnosis not present

## 2019-07-05 DIAGNOSIS — N529 Male erectile dysfunction, unspecified: Secondary | ICD-10-CM | POA: Diagnosis not present

## 2019-07-05 DIAGNOSIS — Z8042 Family history of malignant neoplasm of prostate: Secondary | ICD-10-CM | POA: Diagnosis not present

## 2019-07-05 DIAGNOSIS — R972 Elevated prostate specific antigen [PSA]: Secondary | ICD-10-CM | POA: Diagnosis not present

## 2019-07-08 ENCOUNTER — Other Ambulatory Visit: Payer: Self-pay

## 2019-07-08 ENCOUNTER — Other Ambulatory Visit: Payer: PPO

## 2019-07-08 DIAGNOSIS — R7302 Impaired glucose tolerance (oral): Secondary | ICD-10-CM | POA: Diagnosis not present

## 2019-07-08 DIAGNOSIS — N1832 Chronic kidney disease, stage 3b: Secondary | ICD-10-CM

## 2019-07-08 DIAGNOSIS — R7303 Prediabetes: Secondary | ICD-10-CM | POA: Diagnosis not present

## 2019-07-08 DIAGNOSIS — R413 Other amnesia: Secondary | ICD-10-CM

## 2019-07-08 DIAGNOSIS — I10 Essential (primary) hypertension: Secondary | ICD-10-CM | POA: Diagnosis not present

## 2019-07-08 DIAGNOSIS — E559 Vitamin D deficiency, unspecified: Secondary | ICD-10-CM | POA: Diagnosis not present

## 2019-07-08 DIAGNOSIS — E782 Mixed hyperlipidemia: Secondary | ICD-10-CM | POA: Diagnosis not present

## 2019-07-09 LAB — COMPREHENSIVE METABOLIC PANEL
ALT: 29 IU/L (ref 0–44)
AST: 23 IU/L (ref 0–40)
Albumin/Globulin Ratio: 1.8 (ref 1.2–2.2)
Albumin: 4.5 g/dL (ref 3.7–4.7)
Alkaline Phosphatase: 50 IU/L (ref 39–117)
BUN/Creatinine Ratio: 12 (ref 10–24)
BUN: 14 mg/dL (ref 8–27)
Bilirubin Total: 0.6 mg/dL (ref 0.0–1.2)
CO2: 25 mmol/L (ref 20–29)
Calcium: 9.5 mg/dL (ref 8.6–10.2)
Chloride: 100 mmol/L (ref 96–106)
Creatinine, Ser: 1.18 mg/dL (ref 0.76–1.27)
GFR calc Af Amer: 69 mL/min/{1.73_m2} (ref >59)
GFR calc non Af Amer: 60 mL/min/{1.73_m2} (ref >59)
Globulin, Total: 2.5 g/dL (ref 1.5–4.5)
Glucose: 93 mg/dL (ref 65–99)
Potassium: 4.5 mmol/L (ref 3.5–5.2)
Sodium: 137 mmol/L (ref 134–144)
Total Protein: 7 g/dL (ref 6.0–8.5)

## 2019-07-09 LAB — CBC WITH DIFFERENTIAL/PLATELET
Basophils Absolute: 0 10*3/uL (ref 0.0–0.2)
Basos: 0 %
EOS (ABSOLUTE): 0.2 10*3/uL (ref 0.0–0.4)
Eos: 3 %
Hematocrit: 43.6 % (ref 37.5–51.0)
Hemoglobin: 15.3 g/dL (ref 13.0–17.7)
Immature Grans (Abs): 0 10*3/uL (ref 0.0–0.1)
Immature Granulocytes: 0 %
Lymphocytes Absolute: 2.2 10*3/uL (ref 0.7–3.1)
Lymphs: 33 %
MCH: 30.6 pg (ref 26.6–33.0)
MCHC: 35.1 g/dL (ref 31.5–35.7)
MCV: 87 fL (ref 79–97)
Monocytes Absolute: 0.7 10*3/uL (ref 0.1–0.9)
Monocytes: 10 %
Neutrophils Absolute: 3.7 10*3/uL (ref 1.4–7.0)
Neutrophils: 54 %
Platelets: 335 10*3/uL (ref 150–450)
RBC: 5 x10E6/uL (ref 4.14–5.80)
RDW: 12.9 % (ref 11.6–15.4)
WBC: 6.8 10*3/uL (ref 3.4–10.8)

## 2019-07-09 LAB — LIPID PANEL
Chol/HDL Ratio: 3.3 ratio (ref 0.0–5.0)
Cholesterol, Total: 186 mg/dL (ref 100–199)
HDL: 56 mg/dL (ref >39)
LDL Chol Calc (NIH): 105 mg/dL — ABNORMAL HIGH (ref 0–99)
Triglycerides: 140 mg/dL (ref 0–149)
VLDL Cholesterol Cal: 25 mg/dL (ref 5–40)

## 2019-07-09 LAB — TSH: TSH: 3.46 u[IU]/mL (ref 0.450–4.500)

## 2019-07-09 LAB — HEMOGLOBIN A1C
Est. average glucose Bld gHb Est-mCnc: 120 mg/dL
Hgb A1c MFr Bld: 5.8 % — ABNORMAL HIGH (ref 4.8–5.6)

## 2019-07-09 LAB — T4, FREE: Free T4: 1.08 ng/dL (ref 0.82–1.77)

## 2019-07-09 LAB — T3: T3, Total: 95 ng/dL (ref 71–180)

## 2019-07-09 LAB — VITAMIN D 25 HYDROXY (VIT D DEFICIENCY, FRACTURES): Vit D, 25-Hydroxy: 40.1 ng/mL (ref 30.0–100.0)

## 2019-07-13 ENCOUNTER — Ambulatory Visit (INDEPENDENT_AMBULATORY_CARE_PROVIDER_SITE_OTHER): Payer: PPO | Admitting: Family Medicine

## 2019-07-13 ENCOUNTER — Encounter: Payer: Self-pay | Admitting: Family Medicine

## 2019-07-13 ENCOUNTER — Other Ambulatory Visit: Payer: Self-pay

## 2019-07-13 VITALS — BP 122/81 | HR 76 | Temp 96.4°F | Ht 70.0 in | Wt 222.0 lb

## 2019-07-13 DIAGNOSIS — F39 Unspecified mood [affective] disorder: Secondary | ICD-10-CM | POA: Diagnosis not present

## 2019-07-13 DIAGNOSIS — E559 Vitamin D deficiency, unspecified: Secondary | ICD-10-CM

## 2019-07-13 DIAGNOSIS — E669 Obesity, unspecified: Secondary | ICD-10-CM

## 2019-07-13 DIAGNOSIS — E782 Mixed hyperlipidemia: Secondary | ICD-10-CM

## 2019-07-13 DIAGNOSIS — G4730 Sleep apnea, unspecified: Secondary | ICD-10-CM

## 2019-07-13 DIAGNOSIS — I10 Essential (primary) hypertension: Secondary | ICD-10-CM

## 2019-07-13 DIAGNOSIS — R7303 Prediabetes: Secondary | ICD-10-CM

## 2019-07-13 NOTE — Progress Notes (Signed)
Telehealth office visit note for [name], D.O- at [facility] at [facility]   I connected with current patient today and verified that I am speaking with the correct person using two identifiers.   . Location of the patient: Home . Location of the provider: Office Only the patient (+/- their family members at pt's discretion) and myself were participating in the encounter - This visit type was conducted due to national recommendations for restrictions regarding the COVID-19 Pandemic (e.g. social distancing) in an effort to limit this patient's exposure and mitigate transmission in our community.  This format is felt to be most appropriate for this patient at this time.   - No physical exam could be performed with this format, beyond that communicated to [place] by the patient/ family members as noted.   - Additionally my office staff/ schedulers discussed with the patient that there may be a monetary charge related to this service, depending on their medical insurance.   The patient expressed understanding, and agreed to proceed.       History of Present Illness: Hypertension   I, Toni Amend, am serving as scribe for Dr. Mellody [name].  He is going to pick his wife up today and bring her home; notes "she's been in the hospital all this time."  "We've still got a long way to go, but at least we can do it together."  - Weight Loss Notes at start of appointment, "you wouldn't recognize me now, I'm down to 222 lbs."  Says "I've got a long way to go, but that's pretty good."  Last OV, patient weighed 229 lbs, and 231 lbs in early January.  He was 252 on December 24 2018.  Says "that walking is stripping it off."  Says "I couldn't walk before."  Notes now that he has rehabilitated, his hips hurt less and walking is easier, and "I'm gonna keep that up."  States he's eating about the same as usual, but his blood pressure has improved since his lifestyle changes.  Believes that  since he has discontinued use of artificial sweeteners, his progress with weight loss has improved.  - Vitamin D He takes 50,000 Vitamin D once weekly.  HPI:  Hypertension:  -  His blood pressure at home has been running: 120, down as far as 104, and notes they took him off of some BP meds to maintain control in 120's/80's.  In the past, his BP got as high as 147.  - Patient reports good compliance with medication and/or lifestyle modification  - His denies acute concerns or problems related to treatment plan  - He denies new onset of: chest pain, exercise intolerance, shortness of breath, dizziness, visual changes, headache, lower extremity swelling or claudication.   Last 3 blood pressure readings in our office are as follows: BP Readings from Last 3 Encounters:  07/13/19 122/81  06/28/19 111/74  06/14/19 131/87   Filed Weights   07/13/19 0830  Weight: 222 lb (100.7 kg)     HPI:  Hyperlipidemia:  74 y.o. male here for cholesterol follow-up.   - Patient reports good compliance with treatment plan of:  medication and/ or lifestyle management.    Notes he only takes his krill oil every other day.  He feels that taking cinnamon and garlic has done more for his cholesterol than anything else.  - Patient denies any acute concerns or problems with management plan   - He denies new onset of: myalgias,  arthralgias, increased fatigue more than normal, chest pains, exercise intolerance, shortness of breath, dizziness, visual changes, headache, lower extremity swelling or claudication.   Most recent cholesterol panel was:  Lab Results  Component Value Date   CHOL 186 07/08/2019   HDL 56 07/08/2019   LDLCALC 105 (H) 07/08/2019   TRIG 140 07/08/2019   CHOLHDL 3.3 07/08/2019   Hepatic Function Latest Ref Rng & Units 07/08/2019 01/26/2018 03/29/2016  Total Protein 6.0 - 8.5 g/dL 7.0 6.5 6.9  Albumin 3.7 - 4.7 g/dL 4.5 4.3 4.2  AST 0 - 40 IU/L 23 16 29   ALT 0 - 44 IU/L 29 16 32    Alk Phosphatase 39 - 117 IU/L 50 41 50  Total Bilirubin 0.0 - 1.2 mg/dL 0.6 0.4 0.5        GAD 7 : Generalized Anxiety Score 06/14/2019  Nervous, Anxious, on Edge 0  Control/stop worrying 2  Worry too much - different things 0  Trouble relaxing 0  Restless 0  Easily annoyed or irritable 0  Afraid - awful might happen 0  Total GAD 7 Score 2  Anxiety Difficulty Not difficult at all    Depression screen [facility] 2/9 07/13/2019 06/14/2019 04/06/2019 10/26/2018 06/24/2018  Decreased Interest 0 2 0 0 0  Down, Depressed, Hopeless 0 0 0 0 0  PHQ - 2 Score 0 2 0 0 0  Altered sleeping 0 1 0 0 0  Tired, decreased energy 0 2 0 0 0  Change in appetite 0 0 0 0 0  Feeling bad or failure about yourself  0 0 0 0 0  Trouble concentrating 0 2 0 0 1  Moving slowly or fidgety/restless 0 0 0 0 0  Suicidal thoughts 0 0 0 0 0  PHQ-9 Score 0 7 0 0 1  Difficult doing work/chores - Very difficult - Not difficult at all Not difficult at all      Impression and Recommendations:    1. Essential hypertension   2. Mixed hyperlipidemia   3. Prediabetes   4. Mood disorder ([place])   5. Vitamin D insufficiency   6. Sleep apnea, unspecified type   7. Obesity, unspecified classification, unspecified obesity type, unspecified whether serious comorbidity present      - Reviewed recent lab work (07/08/2019) in depth with patient today.  All lab work within normal limits unless otherwise noted.  Extensive education provided and all questions answered.  - Discussed that patient's kidney function is improving with improved blood pressure, weight loss, and increased exercise.  Encouraged patient to continue with these goals.  Essential Hypertension - Blood pressure currently is at goal. - Patient will continue current treatment regimen.  See med list.  - Counseled patient on pathophysiology of disease and discussed various treatment options, which always includes dietary and lifestyle modification as first line.   -  Lifestyle changes such as dash and heart healthy diets and engaging in a regular exercise program discussed extensively with patient.   - Ambulatory blood pressure monitoring encouraged at least 3 times weekly.  Keep log and bring in every office visit.  Reminded patient that if they ever feel poorly in any way, to check their blood pressure and pulse.  - We will continue to monitor alongside Dr. Martinique of cardiology.  Prediabetes - A1c was 5.8 last check, stable from 5.8 prior.  - Counseled patient on prevention of diabetes and discussed dietary and lifestyle modification as first line.    - Importance of low carb,  heart-healthy diet discussed with patient in addition to regular aerobic exercise of 63mn 5d/week or more.   - Will continue to monitor and re-check as discussed.  Mixed Hyperlipidemia - Intolerant to Statins - Last FLP obtained five days ago: Triglycerides = 140, stable from 145 prior. HDL = 56, stable from 57 prior. LDL = 105, elevated.  - Treatment plan followed by Dr. JMartiniqueof Cardiology. - Results sent to Dr. JMartiniquefor additional review.  - Pt will continue current treatment regimen of fenofibrate, krill oil, and Zetia. - Per patient, also takes garlic and cinnamon. - Per patient, only takes krill oil every other day.  - Encouraged patient to continue working on weight loss and healthy eating.  Discussed prudent dietary changes such as low saturated & trans fat diets for hyperlipidemia and low carb diets for hypertriglyceridemia.  - To help improve HDL, encouraged patient to follow AHA guidelines for regular exercise and also engage in weight loss if BMI above 25.   - We will continue to monitor alongside Dr. JMartiniqueof Cardiology.  Vitamin D Deficiency - 40.1 last check. - Discussed goal of maintenance in 40-60 range.  - Advised patient to continue supplementation as prescribed. - Encouraged patient to engage in physical activity outside during the winter to  increase prudent sun exposure.  - Will continue to monitor and re-check as discussed.  Mood Disorder - Stable at this time. - Continue management as established. - Will continue to monitor.  BMI Counseling - Body mass index is 31.85 kg/m, Obesity Explained to patient what BMI refers to, and what it means medically.    Told patient to think about it as a "medical risk stratification measurement" and how increasing BMI is associated with increasing risk/ or worsening state of various diseases such as hypertension, hyperlipidemia, diabetes, premature OA, depression etc.  American Heart Association guidelines for healthy diet, basically Mediterranean diet, and exercise guidelines of 30 minutes 5 days per week or more discussed in detail.  Health counseling performed.  All questions answered.  - Encouraged patient to continue with weight loss goals, ideally losing weight to below a BMI of 30.  Lifestyle & Preventative Health Maintenance - Advised patient to continue working toward exercising and prudent weight loss to improve overall mental, physical, and emotional health.    - Reviewed the "spokes of the wheel" of mood and health management.  Stressed the importance of ongoing prudent habits, including regular exercise, appropriate sleep hygiene, healthful dietary habits, and prayer/meditation to relax.  - Encouraged patient to engage in daily physical activity, especially a formal exercise routine.  Recommended that the patient eventually strive for at least 150 minutes of moderate cardiovascular activity per week according to guidelines established by the [facility]   - Healthy dietary habits encouraged, including low-carb, and high amounts of lean protein in diet.   - Patient should also consume adequate amounts of water.       - As part of my medical decision making, I reviewed the following data within the eWeinerHistory obtained from pt /family, CMA notes reviewed  and incorporated if applicable, Labs reviewed, Radiograph/ tests reviewed if applicable and OV notes from prior OV's with me, as well as other specialists she/he has seen since seeing me last, were all reviewed and used in my medical decision making process today.    - Additionally, discussion had with patient regarding our treatment plan, and their biases/concerns about that plan were used in my medical decision  making today.    - The patient agreed with the plan and demonstrated an understanding of the instructions.   No barriers to understanding were identified.      Return for OV in 4-6 months, sooner if needed.    I provided 18 minutes of non face-to-face time during this encounter.  Additional time was spent with charting and coordination of care before and after the actual visit commenced.   Note:  This note was prepared with assistance of Dragon voice recognition software. Occasional wrong-word or sound-a-like substitutions may have occurred due to the inherent limitations of voice recognition software.   This document serves as a record of services personally performed by Clifford Dance, DO. It was created on her behalf by Toni Amend, a trained medical scribe. The creation of this record is based on the scribe's personal observations and the provider's statements to them.   This case required medical decision making of at least moderate complexity. The above documentation from Toni Amend, medical scribe, has been reviewed by Marjory Sneddon, D.O.      Patient Care Team    Relationship Specialty Notifications Start End  Clifford Dance, DO PCP - General Family Medicine  10/28/17   Martinique, Peter M, MD Consulting Physician Cardiology  10/28/17   Renette Butters, MD Attending Physician Orthopedic Surgery  10/28/17   Irene Shipper, MD Consulting Physician Gastroenterology  10/28/17   Myrlene Broker, MD Attending Physician Urology  10/28/17   Marilynne Halsted, MD Referring Physician Ophthalmology  10/28/17   Administration, Veterans    02/18/18   Star Age, MD Attending Physician Neurology  11/03/18    Comment: Cognitive decline, tremor, memory concerns  Izora Gala, MD Consulting Physician Otolaryngology  11/03/18   Tisovec, Fransico Him, MD Consulting Physician Internal Medicine  06/17/19      -Vitals obtained; medications/ allergies reconciled;  personal medical, social, Sx etc.histories were updated by CMA, reviewed by me and are reflected in chart   Patient Active Problem List   Diagnosis Date Noted  . Prediabetes 11/03/2018  . Obese 10/28/2017  . Mixed hyperlipidemia 08/25/2007  . Essential hypertension 08/25/2007  . Mood disorder ([place]) 08/25/2007  . GASTROESOPHAGEAL REFLUX DISEASE 08/25/2007  . Vitamin D insufficiency 02/02/2018  . Benign non-nodular prostatic hyperplasia with lower urinary tract symptoms 04/11/2014  . 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  . performed medication counseling 06/14/2019  . Cervical lymphadenopathy 04/29/2019  . Hearing difficulty, bilateral 04/06/2019  . Laryngopharyngeal reflux (LPR) 06/30/2018  . Memory difficulties 06/24/2018  . Tremors of nervous system 06/24/2018  . Feared complaint without diagnosis- believes he has parkinsons 06/24/2018  . Hypertriglyceridemia 02/02/2018  . obesity (BMI 35.0-39.9) with comorbidity ([place]) 02/02/2018  . Glucose intolerance (impaired glucose tolerance) 02/02/2018  . h/o Low serum vitamin B12 02/02/2018  . Chronic kidney disease (CKD) stage G3b/A1, moderately decreased glomerular filtration rate (GFR) between 30-44 mL/min/1.73 square meter and albuminuria creatinine ratio less than 30 mg/g 02/02/2018  . Inactivity 10/28/2017  . Family history of malignant neoplasm of prostate 06/27/2017  . Snores 12/03/2016  . Primary localized osteoarthritis of right knee 08/14/2016  . Primary localized osteoarthritis of left knee 04/10/2016  . History  of radial keratotomy 02/05/2016  . Macular puckering, right eye 01/02/2016  . Nuclear sclerotic cataract of right eye 01/02/2016  . Pseudophakia of left eye 01/02/2016  . S/P lumbar fusion 10/24/2014  . Degenerative spondylolisthesis 09/28/2014  . Spinal stenosis of lumbar region 09/14/2014  .  Midline low back pain without sciatica 07/18/2014  . DDD (degenerative disc disease), lumbosacral 07/18/2014  . Dyspnea 07/19/2013  . COLONIC POLYPS, ADENOMATOUS 08/25/2007  . EXTERNAL HEMORRHOIDS 08/25/2007  . ASTHMA 08/25/2007  . ESOPHAGITIS 08/25/2007  . GASTRITIS 08/25/2007  . DIVERTICULOSIS OF COLON 08/25/2007  . OSTEOARTHRITIS 08/25/2007  . Sleep apnea 08/25/2007  . PYROSIS 08/25/2007  . EPIGASTRIC PAIN 08/25/2007     Current Meds  Medication Sig  . aspirin EC 81 MG tablet Take 81 mg by mouth daily.  . benazepril (LOTENSIN) 20 MG tablet TAKE 1 & 1/2 (ONE & ONE-HALF) TABLETS BY MOUTH ONCE DAILY (Patient taking differently: Taking 1 tab daily)  . Calcium Carbonate-Vitamin D 600-400 MG-UNIT tablet Take 1 tablet by mouth 2 (two) times daily. (Patient taking differently: Take 1 tablet by mouth daily. )  . celecoxib (CELEBREX) 200 MG capsule Take 1 capsule by mouth 2 (two) times daily.   . Choline Fenofibrate (FENOFIBRIC ACID) 135 MG CPDR Take 135 mg by mouth daily.  Marland Kitchen CINNAMON PO Take 1 capsule by mouth 2 (two) times daily.   Marland Kitchen ezetimibe (ZETIA) 10 MG tablet Take 1 tablet (10 mg total) by mouth daily. (Patient taking differently: Take 10 mg by mouth at bedtime. )  . Garlic 0254 MG CAPS Take 1,000 mg by mouth 2 (two) times a week.  . Multiple Vitamin (MULTIVITAMIN) capsule Take by mouth.  . vitamin B-12 (CYANOCOBALAMIN) 100 MCG tablet Take by mouth.  . Vitamin D, Ergocalciferol, (DRISDOL) 1.25 MG (50000 UT) CAPS capsule TAKE 1 CAPSULE BY MOUTH EVERY 7 DAYS     Allergies:  Allergies  Allergen Reactions  . Amoxicillin Hives and Rash    Has patient had a PCN reaction causing immediate  rash, facial/tongue/throat swelling, [name]: #  #  #  YES  #  #  #  Has patient had a PCN reaction causing SEVERE RASH INVOLVING MUCUS MEMBRANES or SKIN NECROSIS: #  #  #  YES  #  #  #  Has patient had a PCN reaction that required hospitalization No Has patient had a PCN reaction occurring within the last 10 years: #  #  #  YES  #  #  #  If all of the above answers are "NO", then may proceed with Cephalosporin use.   . Codeine Other (See Comments)    *  *  Pt does not have allergy to codeine but father had severe reaction to codeine so he never wants to be given to him  *  *  . Toprol Xl [Metoprolol Succinate] Shortness Of Breath  . Atorvastatin Other (See Comments)    Weakness in muscles  . Statins Other (See Comments)    Weakness in muscles respiratory  . Other     intolerant     ROS:  See above HPI for pertinent positives and negatives   Objective:   Blood pressure 122/81, pulse 76, temperature (!) 96.4 F (35.8 C), height 5' 10"  (1.778 m), weight 222 lb (100.7 kg).  (if some vitals are omitted, this means that patient was UNABLE to obtain them even though they were asked to get them prior to OV today.  They were asked to call us at their earliest convenience with these once obtained. )  General: A & O * 3; sounds in no acute distress; in usual state of health.  Skin: Pt confirms warm and dry extremities and pink fingertips HEENT: Pt confirms lips non-cyanotic Chest: Patient  confirms normal chest excursion and movement Respiratory: speaking in full sentences, no conversational dyspnea; patient confirms no use of accessory muscles Psych: insight appears good, mood- appears full

## 2019-08-05 DIAGNOSIS — R972 Elevated prostate specific antigen [PSA]: Secondary | ICD-10-CM | POA: Diagnosis not present

## 2019-08-05 DIAGNOSIS — Z8042 Family history of malignant neoplasm of prostate: Secondary | ICD-10-CM | POA: Diagnosis not present

## 2019-08-05 DIAGNOSIS — N401 Enlarged prostate with lower urinary tract symptoms: Secondary | ICD-10-CM | POA: Diagnosis not present

## 2019-09-10 DIAGNOSIS — N3000 Acute cystitis without hematuria: Secondary | ICD-10-CM | POA: Diagnosis not present

## 2019-10-07 ENCOUNTER — Other Ambulatory Visit: Payer: Self-pay | Admitting: Cardiology

## 2019-10-07 DIAGNOSIS — R079 Chest pain, unspecified: Secondary | ICD-10-CM

## 2019-10-07 DIAGNOSIS — I10 Essential (primary) hypertension: Secondary | ICD-10-CM

## 2019-10-07 DIAGNOSIS — E782 Mixed hyperlipidemia: Secondary | ICD-10-CM

## 2019-12-06 DIAGNOSIS — Z961 Presence of intraocular lens: Secondary | ICD-10-CM | POA: Diagnosis not present

## 2019-12-06 DIAGNOSIS — H04123 Dry eye syndrome of bilateral lacrimal glands: Secondary | ICD-10-CM | POA: Diagnosis not present

## 2019-12-31 DIAGNOSIS — R972 Elevated prostate specific antigen [PSA]: Secondary | ICD-10-CM | POA: Diagnosis not present

## 2020-01-07 DIAGNOSIS — H53002 Unspecified amblyopia, left eye: Secondary | ICD-10-CM | POA: Diagnosis not present

## 2020-01-07 DIAGNOSIS — Z961 Presence of intraocular lens: Secondary | ICD-10-CM | POA: Diagnosis not present

## 2020-01-07 DIAGNOSIS — H35363 Drusen (degenerative) of macula, bilateral: Secondary | ICD-10-CM | POA: Diagnosis not present

## 2020-01-07 DIAGNOSIS — H35371 Puckering of macula, right eye: Secondary | ICD-10-CM | POA: Diagnosis not present

## 2020-01-10 DIAGNOSIS — N529 Male erectile dysfunction, unspecified: Secondary | ICD-10-CM | POA: Diagnosis not present

## 2020-01-10 DIAGNOSIS — Z8042 Family history of malignant neoplasm of prostate: Secondary | ICD-10-CM | POA: Diagnosis not present

## 2020-01-10 DIAGNOSIS — N401 Enlarged prostate with lower urinary tract symptoms: Secondary | ICD-10-CM | POA: Diagnosis not present

## 2020-01-10 DIAGNOSIS — R972 Elevated prostate specific antigen [PSA]: Secondary | ICD-10-CM | POA: Diagnosis not present

## 2020-02-16 ENCOUNTER — Ambulatory Visit: Payer: PPO | Admitting: Cardiology

## 2020-04-17 DIAGNOSIS — R972 Elevated prostate specific antigen [PSA]: Secondary | ICD-10-CM | POA: Diagnosis not present

## 2020-04-17 DIAGNOSIS — Z8042 Family history of malignant neoplasm of prostate: Secondary | ICD-10-CM | POA: Diagnosis not present

## 2020-05-05 NOTE — Progress Notes (Addendum)
[name] Date of Birth: 10/18/1945 Medical Record #[id number]  History of Present Illness: [name] is seen for  follow up of HTN and hyperlipidemia. He has multiple drug intolerances.    He had a normal ETT in March 2015. He did undergo L4-5 back surgery in April 2017.  In October 2017 he underwent left TKR and in March 2018 he had a right TKR.  He is followed at the [facility].   In 2018 he had atypical chest pain and Myoview was done. It was low risk.  On follow up today he is doing well. BP has been well controlled.   Denies chest pain or SOB.   Current Outpatient Medications on File Prior to Visit  Medication Sig Dispense Refill  . celecoxib (CELEBREX) 200 MG capsule Take 1 capsule by mouth 2 (two) times daily.     . Choline Fenofibrate (FENOFIBRIC ACID) 135 MG CPDR Take 135 mg by mouth daily.    Marland Kitchen CINNAMON PO Take 1 capsule by mouth 2 (two) times daily.     Marland Kitchen ezetimibe (ZETIA) 10 MG tablet Take 1 tablet (10 mg total) by mouth daily. (Patient taking differently: Take 10 mg by mouth at bedtime. ) 90 tablet 3  . Garlic 8413 MG CAPS Take 1,000 mg by mouth 2 (two) times daily.     . Multiple Vitamin (MULTIVITAMIN) capsule Take by mouth.    . Vitamin D, Ergocalciferol, (DRISDOL) 1.25 MG (50000 UT) CAPS capsule TAKE 1 CAPSULE BY MOUTH EVERY 7 DAYS 12 capsule 3  . AFLURIA QUADRIVALENT injection      No current facility-administered medications on file prior to visit.    Allergies  Allergen Reactions  . Amoxicillin Hives and Rash    Has patient had a PCN reaction causing immediate rash, facial/tongue/throat swelling, [name]: #  #  #  YES  #  #  #  Has patient had a PCN reaction causing SEVERE RASH INVOLVING MUCUS MEMBRANES or SKIN NECROSIS: #  #  #  YES  #  #  #  Has patient had a PCN reaction that required hospitalization No Has patient had a PCN reaction occurring within the last 10 years: #  #  #  YES  #  #  #  If all of the above answers are  "NO", then may proceed with Cephalosporin use.   . Codeine Other (See Comments)    *  *  Pt does not have allergy to codeine but father had severe reaction to codeine so he never wants to be given to him  *  *  . Toprol Xl [Metoprolol Succinate] Shortness Of Breath  . Atorvastatin Other (See Comments)    Weakness in muscles  . Statins Other (See Comments)    Weakness in muscles respiratory  . Other     intolerant    Past Medical History:  Diagnosis Date  . Allergy   . Asthma    as a child  . Cataract   . Colon polyps   . Depression   . Dyspnea   . Epigastric pain   . Hard of hearing   . Headache    migraines  . Heart murmur   . History of bronchitis   . History of pneumonia   . Hyperlipidemia   . Hypertension   . Knee pain   . Nausea    Occasional  . Osteoarthritis   . Sigmoid diverticulosis   . Wears glasses  Past Surgical History:  Procedure Laterality Date  . APPENDECTOMY    . BACK SURGERY    . COLONOSCOPY  11/06/2004   "several"  . EYE SURGERY Left   . HERNIA REPAIR    . JOINT REPLACEMENT    . MASS EXCISION Right 01/26/2015   Procedure: REMOVAL OF RIGHT CHEST WALL MASS;  Surgeon: Jackolyn Confer, MD;  Location: [place];  Service: General;  Laterality: Right;  . MULTIPLE TOOTH EXTRACTIONS    . POLYPECTOMY    . SHOULDER SURGERY Right 2000s   "severed muscle years beforee; broke collar bone; dr repaired both at the same time"  . TONSILLECTOMY    . TOTAL KNEE ARTHROPLASTY Left 04/10/2016   Procedure: LEFT TOTAL KNEE ARTHROPLASTY;  Surgeon: Ninetta Lights, MD;  Location: [place];  Service: Orthopedics;  Laterality: Left;  LEFT TOTAL KNEE ARTHROPLASTY  . TOTAL KNEE ARTHROPLASTY Right 08/14/2016  . TOTAL KNEE ARTHROPLASTY Right 08/14/2016   Procedure: RIGHT TOTAL KNEE ARTHROPLASTY;  Surgeon: Ninetta Lights, MD;  Location: [place];  Service: Orthopedics;  Laterality: Right;    Social History   Tobacco Use  Smoking Status Former Smoker  .  Packs/day: 20.00  . Years: 0.50  . Pack years: 10.00  . Types: Cigarettes  . Quit date: 04/29/1983  . Years since quitting: 37.0  Smokeless Tobacco Never Used    Social History   Substance and Sexual Activity  Alcohol Use Yes  . Alcohol/week: 4.0 standard drinks  . Types: 4 Standard drinks or equivalent per week   Comment: Occasionally    Family History  Problem Relation Age of Onset  . Heart disease Father   . Colon cancer Maternal Grandfather   . Heart disease Paternal Grandmother   . Esophageal cancer Neg Hx   . Rectal cancer Neg Hx   . Stomach cancer Neg Hx     Review of Systems: As noted in HPI.  All other systems were reviewed and are negative.  Physical Exam: BP 112/70 (BP Location: Left Arm, Patient Position: Sitting)   Pulse 72   Ht 5\' 10"  (1.778 m)   Wt 228 lb 3.2 oz (103.5 kg)   SpO2 95%   BMI 32.74 kg/m  GENERAL:  Well appearing WM in NAD. obese HEENT:  PERRL, EOMI, sclera are clear. Oropharynx is clear. NECK:  No jugular venous distention, carotid upstroke brisk and symmetric, no bruits, no thyromegaly or adenopathy LUNGS:  Clear to auscultation bilaterally CHEST:  Unremarkable HEART:  RRR,  PMI not displaced or sustained,S1 and S2 within normal limits, no S3, no S4: no clicks, no rubs, no murmurs ABD:  Soft, nontender. BS +, no masses or bruits. No hepatomegaly, no splenomegaly EXT:  2 + pulses throughout, no edema, no cyanosis no clubbing SKIN:  Warm and dry.  No rashes NEURO:  Alert and oriented x 3. Cranial nerves II through XII intact. PSYCH:  Cognitively intact    LABORATORY DATA:   Lab Results  Component Value Date   WBC 6.8 07/08/2019   HGB 15.3 07/08/2019   HCT 43.6 07/08/2019   PLT 335 07/08/2019   GLUCOSE 93 07/08/2019   CHOL 186 07/08/2019   TRIG 140 07/08/2019   HDL 56 07/08/2019   LDLCALC 105 (H) 07/08/2019   ALT 29 07/08/2019   AST 23 07/08/2019   NA 137 07/08/2019   K 4.5 07/08/2019   CL 100 07/08/2019   CREATININE  1.18 07/08/2019   BUN 14 07/08/2019   CO2  25 07/08/2019   TSH 3.460 07/08/2019   INR 1.04 03/29/2016   HGBA1C 5.8 (H) 07/08/2019    Labs reviewed from the [place] in 09/26/15: CBC, CMET, TSH all normal. A1c 5.6%. Cholesterol 226, trig 113, HDL 76, LDL 122.   Ecg today showed NSR with rate 72. Normal. I have personally reviewed and interpreted this study.   Myoview 03/26/17: Study Highlights     The left ventricular ejection fraction is normal (55-65%).  Nuclear stress EF: 61%.  There was no ST segment deviation noted during stress.  Defect 1: There is a small defect of moderate severity present in the apex location.  This is a low risk study.   Low risk stress nuclear study with apical thinning but no ischemia; EF 61 with normal wall motion.      Assessment / Plan: 1. Hypertension. Well controlled today on benazepril. Continue 30 mg daily  2. Hyperlipidemia.  Intolerant to statins in the past. Continue Zetia and fenofibrate 160 mg daily. continue fish oil.   3. Atypical chest pain but with  risk factors. Myoview in October 2018 was low risk. Normal EF.   4. Obesity. Encouraged continued weight loss and aerobic activity.  5. PACs. Benign.  I recommended he stop taking ASA for primary prevention.  I will follow up in one year

## 2020-05-10 ENCOUNTER — Other Ambulatory Visit: Payer: Self-pay

## 2020-05-10 ENCOUNTER — Ambulatory Visit: Payer: PPO | Admitting: Cardiology

## 2020-05-10 ENCOUNTER — Encounter: Payer: Self-pay | Admitting: Cardiology

## 2020-05-10 DIAGNOSIS — E782 Mixed hyperlipidemia: Secondary | ICD-10-CM

## 2020-05-10 DIAGNOSIS — R079 Chest pain, unspecified: Secondary | ICD-10-CM

## 2020-05-10 DIAGNOSIS — I10 Essential (primary) hypertension: Secondary | ICD-10-CM | POA: Diagnosis not present

## 2020-05-10 MED ORDER — BENAZEPRIL HCL 20 MG PO TABS: 30.0000 mg | ORAL_TABLET | Freq: Every day | ORAL | 3 refills | Status: DC

## 2020-05-10 NOTE — Patient Instructions (Signed)
Stop ASA  

## 2020-05-24 DIAGNOSIS — H35363 Drusen (degenerative) of macula, bilateral: Secondary | ICD-10-CM | POA: Diagnosis not present

## 2020-05-24 DIAGNOSIS — H04123 Dry eye syndrome of bilateral lacrimal glands: Secondary | ICD-10-CM | POA: Diagnosis not present

## 2020-05-24 DIAGNOSIS — H53002 Unspecified amblyopia, left eye: Secondary | ICD-10-CM | POA: Diagnosis not present

## 2020-05-24 DIAGNOSIS — Z961 Presence of intraocular lens: Secondary | ICD-10-CM | POA: Diagnosis not present

## 2020-05-24 DIAGNOSIS — H35371 Puckering of macula, right eye: Secondary | ICD-10-CM | POA: Diagnosis not present

## 2020-06-29 ENCOUNTER — Other Ambulatory Visit: Payer: Self-pay | Admitting: Cardiology

## 2020-06-29 DIAGNOSIS — E782 Mixed hyperlipidemia: Secondary | ICD-10-CM

## 2020-06-29 DIAGNOSIS — I10 Essential (primary) hypertension: Secondary | ICD-10-CM

## 2020-06-29 DIAGNOSIS — R079 Chest pain, unspecified: Secondary | ICD-10-CM

## 2020-07-13 DIAGNOSIS — R972 Elevated prostate specific antigen [PSA]: Secondary | ICD-10-CM | POA: Diagnosis not present

## 2020-07-20 DIAGNOSIS — N529 Male erectile dysfunction, unspecified: Secondary | ICD-10-CM | POA: Diagnosis not present

## 2020-07-20 DIAGNOSIS — N401 Enlarged prostate with lower urinary tract symptoms: Secondary | ICD-10-CM | POA: Diagnosis not present

## 2020-07-20 DIAGNOSIS — Z8042 Family history of malignant neoplasm of prostate: Secondary | ICD-10-CM | POA: Diagnosis not present

## 2020-07-20 DIAGNOSIS — R972 Elevated prostate specific antigen [PSA]: Secondary | ICD-10-CM | POA: Diagnosis not present

## 2020-08-02 ENCOUNTER — Telehealth: Payer: Self-pay | Admitting: Cardiology

## 2020-08-02 MED ORDER — EZETIMIBE 10 MG PO TABS: 10.0000 mg | ORAL_TABLET | Freq: Every day | ORAL | 3 refills | Status: AC

## 2020-08-02 MED ORDER — CELECOXIB 200 MG PO CAPS: 200.0000 mg | ORAL_CAPSULE | Freq: Two times a day (BID) | ORAL | 0 refills | Status: DC

## 2020-08-02 NOTE — Telephone Encounter (Signed)
OK to refill Celebrex for once time until he can see primary care  Teshara Moree Martinique MD, [facility]

## 2020-08-02 NOTE — Telephone Encounter (Signed)
Patient aware and verbalized understanding. °

## 2020-08-02 NOTE — Telephone Encounter (Signed)
Prescription sent for Zetia, advised Celebrex not prescribed by Dr. Martinique.   He states his primary care provider left the practice and he was unaware and not re-established at this time.  He wanted to check and see if Dr. Martinique would send in a rx for this in the mean time as he has been on it for 40 years and is completely out.      Advised would send to Dr. Martinique to review.

## 2020-08-02 NOTE — Telephone Encounter (Signed)
*  STAT* If patient is at the pharmacy, call can be transferred to refill team.   1. Which medications need to be refilled? (please list name of each medication and dose if known)  celecoxib (CELEBREX) 200 MG capsule ezetimibe (ZETIA) 10 MG tablet  2. Which pharmacy/location (including street and city if local pharmacy) is medication to be sent to? [facility] [place].  3. Do they need a 30 day or 90 day supply? 90 day   Patient is out of both medications and is going out of town tomorrow.

## 2020-09-17 ENCOUNTER — Other Ambulatory Visit: Payer: Self-pay | Admitting: Cardiology

## 2020-09-17 DIAGNOSIS — I10 Essential (primary) hypertension: Secondary | ICD-10-CM

## 2020-09-17 DIAGNOSIS — R079 Chest pain, unspecified: Secondary | ICD-10-CM

## 2020-09-17 DIAGNOSIS — E782 Mixed hyperlipidemia: Secondary | ICD-10-CM

## 2020-09-21 ENCOUNTER — Other Ambulatory Visit: Payer: Self-pay | Admitting: Family Medicine

## 2020-09-21 DIAGNOSIS — E559 Vitamin D deficiency, unspecified: Secondary | ICD-10-CM

## 2021-01-31 DIAGNOSIS — M25552 Pain in left hip: Secondary | ICD-10-CM | POA: Diagnosis not present

## 2021-01-31 DIAGNOSIS — M545 Low back pain, unspecified: Secondary | ICD-10-CM | POA: Diagnosis not present

## 2021-01-31 DIAGNOSIS — M25551 Pain in right hip: Secondary | ICD-10-CM | POA: Diagnosis not present

## 2021-03-09 ENCOUNTER — Other Ambulatory Visit: Payer: Self-pay | Admitting: Surgery

## 2021-03-09 DIAGNOSIS — G8929 Other chronic pain: Secondary | ICD-10-CM | POA: Diagnosis not present

## 2021-03-09 DIAGNOSIS — R1032 Left lower quadrant pain: Secondary | ICD-10-CM | POA: Diagnosis not present

## 2021-03-14 ENCOUNTER — Ambulatory Visit: Payer: PPO | Admitting: Internal Medicine

## 2021-04-09 DIAGNOSIS — R972 Elevated prostate specific antigen [PSA]: Secondary | ICD-10-CM | POA: Diagnosis not present

## 2021-04-12 DIAGNOSIS — Z8042 Family history of malignant neoplasm of prostate: Secondary | ICD-10-CM | POA: Diagnosis not present

## 2021-04-12 DIAGNOSIS — R972 Elevated prostate specific antigen [PSA]: Secondary | ICD-10-CM | POA: Diagnosis not present

## 2021-04-12 DIAGNOSIS — N401 Enlarged prostate with lower urinary tract symptoms: Secondary | ICD-10-CM | POA: Diagnosis not present

## 2021-04-12 DIAGNOSIS — N529 Male erectile dysfunction, unspecified: Secondary | ICD-10-CM | POA: Diagnosis not present

## 2021-04-13 ENCOUNTER — Encounter: Payer: Self-pay | Admitting: Internal Medicine

## 2021-04-13 ENCOUNTER — Ambulatory Visit: Payer: PPO | Admitting: Internal Medicine

## 2021-04-13 VITALS — BP 124/80 | HR 67 | Ht 70.0 in | Wt 238.0 lb

## 2021-04-13 DIAGNOSIS — R1032 Left lower quadrant pain: Secondary | ICD-10-CM

## 2021-04-13 NOTE — Progress Notes (Signed)
HISTORY OF PRESENT ILLNESS:  [name] is a 75 y.o. male with past medical history as listed below who who is sent here at the recommendation of several providers regarding left lower quadrant discomfort.  Patient reports that he had problems with inguinal/testicular and left lower quadrant discomfort about a year and a half ago.  This went on for a while then resolved.  He had recurrence several months ago.  He was seen by his orthopedist as well as a Education officer, environmental.  No particular abnormalities found.  At that time (March 09, 2021) his pain has resolved.  He has not had recurrence since.  Does have a history of multiple adenomatous colon polyps.  Multiple prior colonoscopies including 2004, 2006, 2012, 2015, and most recently December 2020.  He has had no change in his bowel habits from his baseline.  No bleeding.  No weight loss.  Again, over the past month, feeling well.  REVIEW OF SYSTEMS:  All non-GI ROS negative unless otherwise stated in the HPI except for arthritis, back pain, fatigue, muscle cramps, shortness of breath, swelling of the legs and feet  Past Medical History:  Diagnosis Date   Allergy    Asthma    as a child   Cataract    Colon polyps    Depression    Dyspnea    Epigastric pain    Hard of hearing    Headache    migraines   Heart murmur    History of bronchitis    History of pneumonia    Hyperlipidemia    Hypertension    Knee pain    Nausea    Occasional   Osteoarthritis    Sigmoid diverticulosis    Wears glasses     Past Surgical History:  Procedure Laterality Date   APPENDECTOMY     BACK SURGERY     COLONOSCOPY  11/06/2004   "several"   EYE SURGERY Left    HERNIA REPAIR     JOINT REPLACEMENT     MASS EXCISION Right 01/26/2015   Procedure: REMOVAL OF RIGHT CHEST WALL MASS;  Surgeon: Jackolyn Confer, MD;  Location: [place];  Service: General;  Laterality: Right;   MULTIPLE TOOTH EXTRACTIONS     POLYPECTOMY     SHOULDER  SURGERY Right 2000s   "severed muscle years beforee; broke collar bone; dr repaired both at the same time"   TONSILLECTOMY     TOTAL KNEE ARTHROPLASTY Left 04/10/2016   Procedure: LEFT TOTAL KNEE ARTHROPLASTY;  Surgeon: Ninetta Lights, MD;  Location: [place];  Service: Orthopedics;  Laterality: Left;  LEFT TOTAL KNEE ARTHROPLASTY   TOTAL KNEE ARTHROPLASTY Right 08/14/2016   TOTAL KNEE ARTHROPLASTY Right 08/14/2016   Procedure: RIGHT TOTAL KNEE ARTHROPLASTY;  Surgeon: Ninetta Lights, MD;  Location: [place];  Service: Orthopedics;  Laterality: Right;    Social History [name]  reports that he quit smoking about 37 years ago. His smoking use included cigarettes. He has a 10.00 pack-year smoking history. He has never used smokeless tobacco. He reports current alcohol use of about 4.0 standard drinks per week. He reports that he does not use drugs.  family history includes Colon cancer in his maternal grandfather; Heart disease in his father and paternal grandmother.  Allergies  Allergen Reactions   Amoxicillin Hives and Rash    Has patient had a PCN reaction causing immediate rash, facial/tongue/throat swelling, [name]: #  #  #  YES  #  #  #  Has patient had a PCN reaction causing SEVERE RASH INVOLVING MUCUS MEMBRANES or SKIN NECROSIS: #  #  #  YES  #  #  #  Has patient had a PCN reaction that required hospitalization No Has patient had a PCN reaction occurring within the last 10 years: #  #  #  YES  #  #  #  If all of the above answers are "NO", then may proceed with Cephalosporin use.    Codeine Other (See Comments)    *  *  Pt does not have allergy to codeine but father had severe reaction to codeine so he never wants to be given to him  *  *   Toprol Xl [Metoprolol Succinate] Shortness Of Breath   Atorvastatin Other (See Comments)    Weakness in muscles   Statins Other (See Comments)    Weakness in muscles respiratory   Other     intolerant        PHYSICAL EXAMINATION: Vital signs: BP 124/80   Pulse 67   Ht 5\' 10"  (1.778 m)   Wt 238 lb (108 kg)   BMI 34.15 kg/m   Constitutional: generally well-appearing, no acute distress Psychiatric: alert and oriented x3, cooperative Eyes: extraocular movements intact, anicteric, conjunctiva pink Mouth: oral pharynx moist, no lesions Neck: supple no lymphadenopathy Cardiovascular: heart regular rate and rhythm, no murmur Lungs: clear to auscultation bilaterally Abdomen: soft, nontender, nondistended, no obvious ascites, no peritoneal signs, normal bowel sounds, no organomegaly.  No obvious hernia Rectal: Omitted Extremities: no clubbing, cyanosis, or lower extremity edema bilaterally Skin: no lesions on visible extremities Neuro: No focal deficits.  Cranial nerves intact  ASSESSMENT:  1.  Musculoskeletal discomfort versus inguinal ligament strain based on history.  Currently asymptomatic.  Negative physical examination.  This is not a primary GI disorder. 2.  History of adenomatous colon polyps.  Surveillance up-to-date   PLAN:  1.  I told the patient that if he were to have recurrent pain, I would be happy to order him a CT scan of the abdomen pelvis.  He agrees. 2.  Surveillance colonoscopy around December 2025 if medically fit and willing.  A total time of 30 minutes was spent preparing to see the patient, reviewing outside records and tests as well as x-rays.  Obtaining comprehensive history and performing medically appropriate physical examination.  Counseling the patient regarding his above listed issues.  Discussing strategy should his discomfort return.  Finally, documenting clinical information in the health record

## 2021-04-13 NOTE — Patient Instructions (Signed)
If you are age 77 or older, your body mass index should be between 23-30. Your Body mass index is 34.15 kg/m. If this is out of the aforementioned range listed, please consider follow up with your Primary Care Provider.  If you are age 27 or younger, your body mass index should be between 19-25. Your Body mass index is 34.15 kg/m. If this is out of the aformentioned range listed, please consider follow up with your Primary Care Provider.   ________________________________________________________  The [facility] providers would like to encourage you to use [facility] to communicate with providers for non-urgent requests or questions.  Due to long hold times on the telephone, sending your provider a message by [facility] may be a faster and more efficient way to get a response.  Please allow 48 business hours for a response.  Please remember that this is for non-urgent requests.  _______________________________________________________  Please follow up as needed

## 2021-05-02 ENCOUNTER — Other Ambulatory Visit: Payer: Self-pay

## 2021-05-02 ENCOUNTER — Ambulatory Visit: Payer: PPO | Admitting: Podiatry

## 2021-05-02 DIAGNOSIS — M79674 Pain in right toe(s): Secondary | ICD-10-CM | POA: Diagnosis not present

## 2021-05-02 DIAGNOSIS — M79675 Pain in left toe(s): Secondary | ICD-10-CM | POA: Diagnosis not present

## 2021-05-02 DIAGNOSIS — B351 Tinea unguium: Secondary | ICD-10-CM

## 2021-05-02 NOTE — Progress Notes (Signed)
   SUBJECTIVE Patient presents to office today complaining of elongated, thickened nails that cause pain while ambulating in shoes.  Patient is unable to trim their own nails. Patient is here for further evaluation and treatment.  Past Medical History:  Diagnosis Date   Allergy    Asthma    as a child   Cataract    Colon polyps    Depression    Dyspnea    Epigastric pain    Hard of hearing    Headache    migraines   Heart murmur    History of bronchitis    History of pneumonia    Hyperlipidemia    Hypertension    Knee pain    Nausea    Occasional   Osteoarthritis    Sigmoid diverticulosis    Wears glasses     OBJECTIVE General Patient is awake, alert, and oriented x 3 and in no acute distress. Derm Skin is dry and supple bilateral. Negative open lesions or macerations. Remaining integument unremarkable. Nails are tender, long, thickened and dystrophic with subungual debris, consistent with onychomycosis, 1-5 bilateral. No signs of infection noted. Vasc  DP and PT pedal pulses palpable bilaterally. Temperature gradient within normal limits.  Neuro Epicritic and protective threshold sensation grossly intact bilaterally.  Musculoskeletal Exam No symptomatic pedal deformities noted bilateral. Muscular strength within normal limits.  ASSESSMENT 1.  Pain due to onychomycosis of toenails both  PLAN OF CARE 1. Patient evaluated today.  2. Instructed to maintain good pedal hygiene and foot care.  3. Mechanical debridement of nails 1-5 bilaterally performed using a nail nipper. Filed with dremel without incident.  4. Return to clinic in 3 mos.    Edrick Kins, DPM [facility]  Dr. Edrick Kins, DPM    [address], [place] [address]                Office [phone]  Fax [phone]

## 2021-05-16 ENCOUNTER — Other Ambulatory Visit: Payer: Self-pay | Admitting: Cardiology

## 2021-05-16 DIAGNOSIS — I10 Essential (primary) hypertension: Secondary | ICD-10-CM

## 2021-05-16 DIAGNOSIS — R079 Chest pain, unspecified: Secondary | ICD-10-CM

## 2021-05-16 DIAGNOSIS — E782 Mixed hyperlipidemia: Secondary | ICD-10-CM

## 2021-05-22 ENCOUNTER — Telehealth: Payer: Self-pay | Admitting: Cardiology

## 2021-05-22 NOTE — Telephone Encounter (Signed)
°*  STAT* If patient is at the pharmacy, call can be transferred to refill team.   1. Which medications need to be refilled? (please list name of each medication and dose if known) benazepril (LOTENSIN) 20 MG tablet  2. Which pharmacy/location (including street and city if local pharmacy) is medication to be sent to? [facility] [place].  3. Do they need a 30 day or 90 day supply? 90 day   Patient requests the [facility] be removed from his chart. He is now wanting all refills to go to [facility]. Patient has 1 week left.

## 2021-05-22 NOTE — Telephone Encounter (Signed)
Medication refilled- patient has annual appointment on 06/29/21

## 2021-06-15 ENCOUNTER — Ambulatory Visit: Payer: PPO | Admitting: Cardiology

## 2021-06-18 NOTE — Progress Notes (Signed)
[name] Date of Birth: 03/08/1946 Medical Record #[id number]  History of Present Illness: Terik is seen for  follow up of HTN and hyperlipidemia. He has multiple drug intolerances.    He had a normal ETT in March 2015. He did undergo L4-5 back surgery in April 2017.  In October 2017 he underwent left TKR and in March 2018 he had a right TKR.  He is followed at the [facility].   In 2018 he had atypical chest pain and Myoview was done. It was low risk.  On follow up today he is doing well. BP has been well controlled.   Has occasional pain beneath his left breast relieved with either ASA or TUMS.   Current Outpatient Medications on File Prior to Visit  Medication Sig Dispense Refill   benazepril (LOTENSIN) 20 MG tablet Take 20 mg by mouth daily. Take 0.5 tablet by mouth daily.     celecoxib (CELEBREX) 200 MG capsule Take 1 capsule by mouth 2 (two) times daily.     Choline Fenofibrate (FENOFIBRIC ACID) 135 MG CPDR Take 135 mg by mouth daily.     CINNAMON PO Take 1 capsule by mouth 2 (two) times daily.      ezetimibe (ZETIA) 10 MG tablet Take 1 tablet (10 mg total) by mouth daily. 90 tablet 3   Garlic 4580 MG CAPS Take 1,000 mg by mouth 2 (two) times daily.      loratadine (CLARITIN) 10 MG tablet Take by mouth.     Multiple Vitamin (MULTIVITAMIN) capsule Take by mouth.     No current facility-administered medications on file prior to visit.    Allergies  Allergen Reactions   Amoxicillin Hives and Rash    Has patient had a PCN reaction causing immediate rash, facial/tongue/throat swelling, [name]: #  #  #  YES  #  #  #  Has patient had a PCN reaction causing SEVERE RASH INVOLVING MUCUS MEMBRANES or SKIN NECROSIS: #  #  #  YES  #  #  #  Has patient had a PCN reaction that required hospitalization No Has patient had a PCN reaction occurring within the last 10 years: #  #  #  YES  #  #  #  If all of the above answers are "NO", then may proceed  with Cephalosporin use.    Codeine Other (See Comments)    *  *  Pt does not have allergy to codeine but father had severe reaction to codeine so he never wants to be given to him  *  *   Toprol Xl [Metoprolol Succinate] Shortness Of Breath   Atorvastatin Other (See Comments)    Weakness in muscles   Statins Other (See Comments)    Weakness in muscles respiratory   Other     intolerant   Niacin Itching    Other reaction(s): Itching of eye    Past Medical History:  Diagnosis Date   Allergy    Asthma    as a child   Cataract    Colon polyps    Depression    Dyspnea    Epigastric pain    Hard of hearing    Headache    migraines   Heart murmur    History of bronchitis    History of pneumonia    Hyperlipidemia    Hypertension    Knee pain    Nausea    Occasional   Osteoarthritis  Sigmoid diverticulosis    Wears glasses     Past Surgical History:  Procedure Laterality Date   APPENDECTOMY     BACK SURGERY     COLONOSCOPY  11/06/2004   "several"   EYE SURGERY Left    HERNIA REPAIR     JOINT REPLACEMENT     MASS EXCISION Right 01/26/2015   Procedure: REMOVAL OF RIGHT CHEST WALL MASS;  Surgeon: Jackolyn Confer, MD;  Location: [facility];  Service: General;  Laterality: Right;   MULTIPLE TOOTH EXTRACTIONS     POLYPECTOMY     SHOULDER SURGERY Right 2000s   "severed muscle years beforee; broke collar bone; dr repaired both at the same time"   TONSILLECTOMY     TOTAL KNEE ARTHROPLASTY Left 04/10/2016   Procedure: LEFT TOTAL KNEE ARTHROPLASTY;  Surgeon: Ninetta Lights, MD;  Location: [facility];  Service: Orthopedics;  Laterality: Left;  LEFT TOTAL KNEE ARTHROPLASTY   TOTAL KNEE ARTHROPLASTY Right 08/14/2016   TOTAL KNEE ARTHROPLASTY Right 08/14/2016   Procedure: RIGHT TOTAL KNEE ARTHROPLASTY;  Surgeon: Ninetta Lights, MD;  Location: [facility];  Service: Orthopedics;  Laterality: Right;    Social History   Tobacco Use  Smoking Status Former   Packs/day:  20.00   Years: 0.50   Pack years: 10.00   Types: Cigarettes   Quit date: 04/29/1983   Years since quitting: 38.1  Smokeless Tobacco Never    Social History   Substance and Sexual Activity  Alcohol Use Yes   Alcohol/week: 4.0 standard drinks   Types: 4 Standard drinks or equivalent per week   Comment: Occasionally    Family History  Problem Relation Age of Onset   Heart disease Father    Colon cancer Maternal Grandfather    Heart disease Paternal Grandmother    Esophageal cancer Neg Hx    Rectal cancer Neg Hx    Stomach cancer Neg Hx     Review of Systems: As noted in HPI.  All other systems were reviewed and are negative.  Physical Exam: BP 126/82    Pulse 69    Ht 5\' 10"  (1.778 m)    Wt 235 lb 6.4 oz (106.8 kg)    SpO2 98%    BMI 33.78 kg/m  GENERAL:  Well appearing WM in NAD. obese HEENT:  PERRL, EOMI, sclera are clear. Oropharynx is clear. NECK:  No jugular venous distention, carotid upstroke brisk and symmetric, no bruits, no thyromegaly or adenopathy LUNGS:  Clear to auscultation bilaterally CHEST:  Unremarkable HEART:  RRR,  PMI not displaced or sustained,S1 and S2 within normal limits, no S3, no S4: no clicks, no rubs, no murmurs ABD:  Soft, nontender. BS +, no masses or bruits. No hepatomegaly, no splenomegaly EXT:  2 + pulses throughout, no edema, no cyanosis no clubbing SKIN:  Warm and dry.  No rashes NEURO:  Alert and oriented x 3. Cranial nerves II through XII intact. PSYCH:  Cognitively intact    LABORATORY DATA:   Lab Results  Component Value Date   WBC 6.8 07/08/2019   HGB 15.3 07/08/2019   HCT 43.6 07/08/2019   PLT 335 07/08/2019   GLUCOSE 93 07/08/2019   CHOL 186 07/08/2019   TRIG 140 07/08/2019   HDL 56 07/08/2019   LDLCALC 105 (H) 07/08/2019   ALT 29 07/08/2019   AST 23 07/08/2019   NA 137 07/08/2019   K 4.5 07/08/2019   CL 100 07/08/2019   CREATININE 1.18 07/08/2019  BUN 14 07/08/2019   CO2 25 07/08/2019   TSH 3.460 07/08/2019    INR 1.04 03/29/2016   HGBA1C 5.8 (H) 07/08/2019    Labs reviewed from the [facility] in 09/26/15: CBC, CMET, TSH all normal. A1c 5.6%. Cholesterol 226, trig 113, HDL 76, LDL 122.   Ecg today showed NSR with rate 69. First degree AV block. Otherwise Normal. I have personally reviewed and interpreted this study.   Myoview 03/26/17: Study Highlights     The left ventricular ejection fraction is normal (55-65%). Nuclear stress EF: 61%. There was no ST segment deviation noted during stress. Defect 1: There is a small defect of moderate severity present in the apex location. This is a low risk study.   Low risk stress nuclear study with apical thinning but no ischemia; EF 61 with normal wall motion.       Assessment / Plan: 1. Hypertension. Well controlled today on benazepril. Continue 10 mg daily  2. Hyperlipidemia.  Intolerant to statins in the past. Continue Zetia and fenofibrate 160 mg daily. continue fish oil. Labs monitored by the [facility]  3. Atypical chest pain but with  risk factors. Myoview in October 2018 was low risk. Normal EF.   4. Obesity. Encouraged continued weight loss and aerobic activity.  5. PACs. Benign.  I will follow up in one year

## 2021-06-20 DIAGNOSIS — Z9889 Other specified postprocedural states: Secondary | ICD-10-CM | POA: Diagnosis not present

## 2021-06-20 DIAGNOSIS — H04123 Dry eye syndrome of bilateral lacrimal glands: Secondary | ICD-10-CM | POA: Diagnosis not present

## 2021-06-20 DIAGNOSIS — Z961 Presence of intraocular lens: Secondary | ICD-10-CM | POA: Diagnosis not present

## 2021-06-20 DIAGNOSIS — H35363 Drusen (degenerative) of macula, bilateral: Secondary | ICD-10-CM | POA: Diagnosis not present

## 2021-06-29 ENCOUNTER — Ambulatory Visit: Payer: PPO | Admitting: Cardiology

## 2021-06-29 ENCOUNTER — Other Ambulatory Visit: Payer: Self-pay

## 2021-06-29 ENCOUNTER — Encounter: Payer: Self-pay | Admitting: Cardiology

## 2021-06-29 VITALS — BP 126/82 | HR 69 | Ht 70.0 in | Wt 235.4 lb

## 2021-06-29 DIAGNOSIS — I10 Essential (primary) hypertension: Secondary | ICD-10-CM | POA: Diagnosis not present

## 2021-06-29 DIAGNOSIS — E782 Mixed hyperlipidemia: Secondary | ICD-10-CM

## 2021-09-12 ENCOUNTER — Ambulatory Visit: Payer: PPO | Admitting: Podiatry

## 2021-11-09 DIAGNOSIS — R972 Elevated prostate specific antigen [PSA]: Secondary | ICD-10-CM | POA: Diagnosis not present

## 2021-11-15 DIAGNOSIS — R972 Elevated prostate specific antigen [PSA]: Secondary | ICD-10-CM | POA: Diagnosis not present

## 2021-11-15 DIAGNOSIS — N529 Male erectile dysfunction, unspecified: Secondary | ICD-10-CM | POA: Diagnosis not present

## 2021-11-15 DIAGNOSIS — N401 Enlarged prostate with lower urinary tract symptoms: Secondary | ICD-10-CM | POA: Diagnosis not present

## 2021-11-15 DIAGNOSIS — Z8042 Family history of malignant neoplasm of prostate: Secondary | ICD-10-CM | POA: Diagnosis not present

## 2021-11-29 ENCOUNTER — Other Ambulatory Visit: Payer: Self-pay | Admitting: Cardiology

## 2021-12-03 ENCOUNTER — Other Ambulatory Visit: Payer: Self-pay | Admitting: Family Medicine

## 2021-12-05 ENCOUNTER — Encounter: Payer: Self-pay | Admitting: Cardiology

## 2021-12-05 NOTE — Telephone Encounter (Signed)
Error

## 2021-12-19 DIAGNOSIS — H35363 Drusen (degenerative) of macula, bilateral: Secondary | ICD-10-CM | POA: Diagnosis not present

## 2021-12-19 DIAGNOSIS — Z9889 Other specified postprocedural states: Secondary | ICD-10-CM | POA: Diagnosis not present

## 2021-12-19 DIAGNOSIS — H04123 Dry eye syndrome of bilateral lacrimal glands: Secondary | ICD-10-CM | POA: Diagnosis not present

## 2021-12-19 DIAGNOSIS — Z961 Presence of intraocular lens: Secondary | ICD-10-CM | POA: Diagnosis not present

## 2022-05-13 DIAGNOSIS — M1811 Unilateral primary osteoarthritis of first carpometacarpal joint, right hand: Secondary | ICD-10-CM | POA: Diagnosis not present

## 2022-06-27 DIAGNOSIS — Z8042 Family history of malignant neoplasm of prostate: Secondary | ICD-10-CM | POA: Diagnosis not present

## 2022-06-27 DIAGNOSIS — N401 Enlarged prostate with lower urinary tract symptoms: Secondary | ICD-10-CM | POA: Diagnosis not present

## 2022-06-27 DIAGNOSIS — R972 Elevated prostate specific antigen [PSA]: Secondary | ICD-10-CM | POA: Diagnosis not present

## 2022-08-02 DIAGNOSIS — M7061 Trochanteric bursitis, right hip: Secondary | ICD-10-CM | POA: Diagnosis not present

## 2022-08-07 DIAGNOSIS — M19049 Primary osteoarthritis, unspecified hand: Secondary | ICD-10-CM | POA: Diagnosis not present

## 2022-08-07 DIAGNOSIS — M7061 Trochanteric bursitis, right hip: Secondary | ICD-10-CM | POA: Diagnosis not present

## 2022-08-07 DIAGNOSIS — R262 Difficulty in walking, not elsewhere classified: Secondary | ICD-10-CM | POA: Diagnosis not present

## 2022-08-07 DIAGNOSIS — M25641 Stiffness of right hand, not elsewhere classified: Secondary | ICD-10-CM | POA: Diagnosis not present

## 2022-08-07 DIAGNOSIS — M79644 Pain in right finger(s): Secondary | ICD-10-CM | POA: Diagnosis not present

## 2022-08-07 DIAGNOSIS — R29898 Other symptoms and signs involving the musculoskeletal system: Secondary | ICD-10-CM | POA: Diagnosis not present

## 2022-08-07 DIAGNOSIS — Z7409 Other reduced mobility: Secondary | ICD-10-CM | POA: Diagnosis not present

## 2022-08-07 DIAGNOSIS — M25551 Pain in right hip: Secondary | ICD-10-CM | POA: Diagnosis not present

## 2022-08-07 DIAGNOSIS — Z789 Other specified health status: Secondary | ICD-10-CM | POA: Diagnosis not present

## 2022-08-12 DIAGNOSIS — M545 Low back pain, unspecified: Secondary | ICD-10-CM | POA: Diagnosis not present

## 2022-08-12 DIAGNOSIS — M25551 Pain in right hip: Secondary | ICD-10-CM | POA: Diagnosis not present

## 2022-08-12 DIAGNOSIS — M79604 Pain in right leg: Secondary | ICD-10-CM | POA: Diagnosis not present

## 2022-08-12 DIAGNOSIS — M7061 Trochanteric bursitis, right hip: Secondary | ICD-10-CM | POA: Diagnosis not present

## 2022-10-02 DIAGNOSIS — M545 Low back pain, unspecified: Secondary | ICD-10-CM | POA: Diagnosis not present

## 2022-10-02 DIAGNOSIS — M4316 Spondylolisthesis, lumbar region: Secondary | ICD-10-CM | POA: Diagnosis not present

## 2022-12-30 DIAGNOSIS — R972 Elevated prostate specific antigen [PSA]: Secondary | ICD-10-CM | POA: Diagnosis not present

## 2023-01-09 DIAGNOSIS — N401 Enlarged prostate with lower urinary tract symptoms: Secondary | ICD-10-CM | POA: Diagnosis not present

## 2023-01-09 DIAGNOSIS — Z8042 Family history of malignant neoplasm of prostate: Secondary | ICD-10-CM | POA: Diagnosis not present

## 2023-01-09 DIAGNOSIS — N529 Male erectile dysfunction, unspecified: Secondary | ICD-10-CM | POA: Diagnosis not present

## 2023-01-09 DIAGNOSIS — K4090 Unilateral inguinal hernia, without obstruction or gangrene, not specified as recurrent: Secondary | ICD-10-CM | POA: Diagnosis not present

## 2023-01-09 DIAGNOSIS — R972 Elevated prostate specific antigen [PSA]: Secondary | ICD-10-CM | POA: Diagnosis not present

## 2023-01-30 ENCOUNTER — Telehealth: Payer: Self-pay | Admitting: Cardiology

## 2023-01-30 MED ORDER — BENAZEPRIL HCL 20 MG PO TABS: 30.0000 mg | ORAL_TABLET | Freq: Every day | ORAL | 0 refills | Status: DC

## 2023-01-30 NOTE — Telephone Encounter (Signed)
*  STAT* If patient is at the pharmacy, call can be transferred to refill team.   1. Which medications need to be refilled? (please list name of each medication and dose if known)   benazepril (LOTENSIN) 20 MG tablet     2. Would you like to learn more about the convenience, safety, & potential cost savings by using the [facility]? No   3. Are you open to using the [facility] (Type [facility]. ) No   4. Which pharmacy/location (including street and city if local pharmacy) is medication to be sent to?  [facility] - [place], [place] - [address]     5. Do they need a 30 day or 90 day supply? 90 day   Pt has scheduled appt on 9/13

## 2023-01-30 NOTE — Telephone Encounter (Signed)
Medication refill request completed.  

## 2023-02-03 DIAGNOSIS — H04123 Dry eye syndrome of bilateral lacrimal glands: Secondary | ICD-10-CM | POA: Diagnosis not present

## 2023-02-03 DIAGNOSIS — H35363 Drusen (degenerative) of macula, bilateral: Secondary | ICD-10-CM | POA: Diagnosis not present

## 2023-02-03 DIAGNOSIS — Z9889 Other specified postprocedural states: Secondary | ICD-10-CM | POA: Diagnosis not present

## 2023-02-03 DIAGNOSIS — Z961 Presence of intraocular lens: Secondary | ICD-10-CM | POA: Diagnosis not present

## 2023-02-19 NOTE — Progress Notes (Deleted)
Cardiology Clinic Note   Patient Name: [name] Date of Encounter: 02/19/2023  Primary Care Provider:  System, Provider Not In Primary Cardiologist:  Peter Swaziland, MD  Patient Profile    77 year old male with history of hypertension, hyper lipidemia, with normal ETT in March 2015, underwent L4-5 back surgery in April 2017, left TKR in October 2017 and right TKR in March 2018.  He is followed by [facility].  Last seen by Dr. Swaziland on 03/29/2022 and was doing well.  Blood pressure was well-controlled, it was noted that he was intolerant to statins in the past and he was continued on Zetia and fenofibrate 160 mg daily along with fish oil.  He has a history of atypical chest pain with low risk Myoview in October 2018.  Past Medical History    Past Medical History:  Diagnosis Date   Allergy    Asthma    as a child   Cataract    Colon polyps    Depression    Dyspnea    Epigastric pain    Hard of hearing    Headache    migraines   Heart murmur    History of bronchitis    History of pneumonia    Hyperlipidemia    Hypertension    Knee pain    Nausea    Occasional   Osteoarthritis    Sigmoid diverticulosis    Wears glasses    Past Surgical History:  Procedure Laterality Date   APPENDECTOMY     BACK SURGERY     COLONOSCOPY  11/06/2004   "several"   EYE SURGERY Left    HERNIA REPAIR     JOINT REPLACEMENT     MASS EXCISION Right 01/26/2015   Procedure: REMOVAL OF RIGHT CHEST WALL MASS;  Surgeon: Avel Peace, MD;  Location: [facility];  Service: General;  Laterality: Right;   MULTIPLE TOOTH EXTRACTIONS     POLYPECTOMY     SHOULDER SURGERY Right 2000s   "severed muscle years beforee; broke collar bone; dr repaired both at the same time"   TONSILLECTOMY     TOTAL KNEE ARTHROPLASTY Left 04/10/2016   Procedure: LEFT TOTAL KNEE ARTHROPLASTY;  Surgeon: Loreta Ave, MD;  Location: [facility] OR;  Service: Orthopedics;  Laterality: Left;  LEFT TOTAL KNEE  ARTHROPLASTY   TOTAL KNEE ARTHROPLASTY Right 08/14/2016   TOTAL KNEE ARTHROPLASTY Right 08/14/2016   Procedure: RIGHT TOTAL KNEE ARTHROPLASTY;  Surgeon: Loreta Ave, MD;  [facility];  Service: Orthopedics;  Laterality: Right;    Allergies  Allergies  Allergen Reactions   Amoxicillin Hives and Rash    Has patient had a PCN reaction causing immediate rash, facial/tongue/throat swelling, [name]: #  #  #  YES  #  #  #  Has patient had a PCN reaction causing SEVERE RASH INVOLVING MUCUS MEMBRANES or SKIN NECROSIS: #  #  #  YES  #  #  #  Has patient had a PCN reaction that required hospitalization No Has patient had a PCN reaction occurring within the last 10 years: #  #  #  YES  #  #  #  If all of the above answers are "NO", then may proceed with Cephalosporin use.    Codeine Other (See Comments)    *  *  Pt does not have allergy to codeine but father had severe reaction to codeine so he never wants to be given to him  *  *  Toprol Xl [Metoprolol Succinate] Shortness Of Breath   Atorvastatin Other (See Comments)    Weakness in muscles   Statins Other (See Comments)    Weakness in muscles respiratory   Other     intolerant   Niacin Itching    Other reaction(s): Itching of eye    History of Present Illness    ***  Home Medications    Current Outpatient Medications  Medication Sig Dispense Refill   benazepril (LOTENSIN) 20 MG tablet Take 1.5 tablets (30 mg total) by mouth daily. 135 tablet 0   celecoxib (CELEBREX) 200 MG capsule Take 1 capsule by mouth 2 (two) times daily.     Choline Fenofibrate (FENOFIBRIC ACID) 135 MG CPDR Take 135 mg by mouth daily.     CINNAMON PO Take 1 capsule by mouth 2 (two) times daily.      ezetimibe (ZETIA) 10 MG tablet Take 1 tablet (10 mg total) by mouth daily. 90 tablet 3   Garlic 1000 MG CAPS Take 1,000 mg by mouth 2 (two) times daily.      loratadine (CLARITIN) 10 MG tablet Take by mouth.     Multiple  Vitamin (MULTIVITAMIN) capsule Take by mouth.     No current facility-administered medications for this visit.     Family History    Family History  Problem Relation Age of Onset   Heart disease Father    Colon cancer Maternal Grandfather    Heart disease Paternal Grandmother    Esophageal cancer Neg Hx    Rectal cancer Neg Hx    Stomach cancer Neg Hx    He indicated that his mother is deceased. He indicated that his father is deceased. He indicated that the status of his maternal grandfather is unknown. He indicated that the status of his paternal grandmother is unknown. He indicated that the status of his neg hx is unknown.  Social History    Social History   Socioeconomic History   Marital status: Married    Spouse name: Not on file   Number of children: Not on file   Years of education: Not on file   Highest education level: Not on file  Occupational History   Not on file  Tobacco Use   Smoking status: Former    Current packs/day: 0.00    Average packs/day: 20.0 packs/day for 0.5 years (10.0 ttl pk-yrs)    Types: Cigarettes    Start date: 10/29/1982    Quit date: 04/29/1983    Years since quitting: 39.8   Smokeless tobacco: Never  Vaping Use   Vaping status: Never Used  Substance and Sexual Activity   Alcohol use: Yes    Alcohol/week: 4.0 standard drinks of alcohol    Types: 4 Standard drinks or equivalent per week    Comment: Occasionally   Drug use: No   Sexual activity: Not Currently  Other Topics Concern   Not on file  Social History Narrative   Not on file   Social Determinants of Health   Financial Resource Strain: Not on file  Food Insecurity: Low Risk  (01/09/2023)   Received from [facility]   Hunger Vital Sign    Worried About Running Out of Food in the Last Year: Never true    Ran Out of Food in the Last Year: Never true  Transportation Needs: Not on file (01/09/2023)  Physical Activity: Not on file  Stress: Not on file  Social Connections:  Not on file  Intimate Partner Violence: Not  on file     Review of Systems    General:  No chills, fever, night sweats or weight changes.  Cardiovascular:  No chest pain, dyspnea on exertion, edema, orthopnea, palpitations, paroxysmal nocturnal dyspnea. Dermatological: No rash, lesions/masses Respiratory: No cough, dyspnea Urologic: No hematuria, dysuria Abdominal:   No nausea, vomiting, diarrhea, bright red blood per rectum, melena, or hematemesis Neurologic:  No visual changes, wkns, changes in mental status. All other systems reviewed and are otherwise negative except as noted above.       Physical Exam    VS:  There were no vitals taken for this visit. , BMI There is no height or weight on file to calculate BMI.     GEN: Well nourished, well developed, in no acute distress. HEENT: normal. Neck: Supple, no JVD, carotid bruits, or masses. Cardiac: RRR, no murmurs, rubs, or gallops. No clubbing, cyanosis, edema.  Radials/DP/PT 2+ and equal bilaterally.  Respiratory:  Respirations regular and unlabored, clear to auscultation bilaterally. GI: Soft, nontender, nondistended, BS + x 4. MS: no deformity or atrophy. Skin: warm and dry, no rash. Neuro:  Strength and sensation are intact. Psych: Normal affect.      Lab Results  Component Value Date   WBC 6.8 07/08/2019   HGB 15.3 07/08/2019   HCT 43.6 07/08/2019   MCV 87 07/08/2019   PLT 335 07/08/2019   Lab Results  Component Value Date   CREATININE 1.18 07/08/2019   BUN 14 07/08/2019   NA 137 07/08/2019   K 4.5 07/08/2019   CL 100 07/08/2019   CO2 25 07/08/2019   Lab Results  Component Value Date   ALT 29 07/08/2019   AST 23 07/08/2019   ALKPHOS 50 07/08/2019   BILITOT 0.6 07/08/2019   Lab Results  Component Value Date   CHOL 186 07/08/2019   HDL 56 07/08/2019   LDLCALC 105 (H) 07/08/2019   TRIG 140 07/08/2019   CHOLHDL 3.3 07/08/2019    Lab Results  Component Value Date   HGBA1C 5.8 (H) 07/08/2019      Review of Prior Studies      Assessment & Plan   1.  ***     {Are you ordering a CV Procedure (e.g. stress test, cath, DCCV, TEE, etc)?   Press F2        :[id number]}   Signed, Bettey Mare. Liborio Nixon, ANP, AACC   02/19/2023 3:13 PM      Office [phone] Fax [phone]  Notice: This dictation was prepared with Dragon dictation along with smaller phrase technology. Any transcriptional errors that result from this process are unintentional and may not be corrected upon review.

## 2023-02-21 ENCOUNTER — Ambulatory Visit: Payer: PPO | Admitting: Adult Health

## 2023-02-24 NOTE — Progress Notes (Unsigned)
Cardiology Clinic Note   Patient Name: [name] Date of Encounter: 02/26/2023  Primary Care Provider:  System, Provider Not In Primary Cardiologist:  Peter Swaziland, MD  Patient Profile    [name] 77 year old male presents the clinic today for follow-up evaluation of his hypertension and hyperlipidemia.  Past Medical History    Past Medical History:  Diagnosis Date   Allergy    Asthma    as a child   Cataract    Colon polyps    Depression    Dyspnea    Epigastric pain    Hard of hearing    Headache    migraines   Heart murmur    History of bronchitis    History of pneumonia    Hyperlipidemia    Hypertension    Knee pain    Nausea    Occasional   Osteoarthritis    Sigmoid diverticulosis    Wears glasses    Past Surgical History:  Procedure Laterality Date   APPENDECTOMY     BACK SURGERY     COLONOSCOPY  11/06/2004   "several"   EYE SURGERY Left    HERNIA REPAIR     JOINT REPLACEMENT     MASS EXCISION Right 01/26/2015   Procedure: REMOVAL OF RIGHT CHEST WALL MASS;  Surgeon: Avel Peace, MD;  Location: [facility];  Service: General;  Laterality: Right;   MULTIPLE TOOTH EXTRACTIONS     POLYPECTOMY     SHOULDER SURGERY Right 2000s   "severed muscle years beforee; broke collar bone; dr repaired both at the same time"   TONSILLECTOMY     TOTAL KNEE ARTHROPLASTY Left 04/10/2016   Procedure: LEFT TOTAL KNEE ARTHROPLASTY;  Surgeon: Loreta Ave, MD;  Location: [facility] OR;  Service: Orthopedics;  Laterality: Left;  LEFT TOTAL KNEE ARTHROPLASTY   TOTAL KNEE ARTHROPLASTY Right 08/14/2016   TOTAL KNEE ARTHROPLASTY Right 08/14/2016   Procedure: RIGHT TOTAL KNEE ARTHROPLASTY;  Surgeon: Loreta Ave, MD;  Location: [facility] OR;  Service: Orthopedics;  Laterality: Right;    Allergies  Allergies  Allergen Reactions   Amoxicillin Hives and Rash    Has patient had a PCN reaction causing immediate rash, facial/tongue/throat swelling, SOB or  lightheadedness with hypotension: #  #  #  YES  #  #  #  Has patient had a PCN reaction causing SEVERE RASH INVOLVING MUCUS MEMBRANES or SKIN NECROSIS: #  #  #  YES  #  #  #  Has patient had a PCN reaction that required hospitalization No Has patient had a PCN reaction occurring within the last 10 years: #  #  #  YES  #  #  #  If all of the above answers are "NO", then may proceed with Cephalosporin use.    Codeine Other (See Comments)    *  *  Pt does not have allergy to codeine but father had severe reaction to codeine so he never wants to be given to him  *  *   Toprol Xl [Metoprolol Succinate] Shortness Of Breath   Atorvastatin Other (See Comments)    Weakness in muscles   Statins Other (See Comments)    Weakness in muscles respiratory   Other     intolerant   Niacin Itching    Other reaction(s): Itching of eye    History of Present Illness    [name] is a PMH of multiple drug intolerances, hypertension, and  hyperlipidemia.  He underwent exercise treadmill test 3/15 which was normal.  He had L4-5 back surgery 4/17.  In October 2017 he underwent left TKA and then 3/18 he had right TKA.  He is a patient at [facility].  He was noted to have atypical chest pain in 2018.  He underwent Myoview stress testing.  It showed low risk.  He followed up with Dr. Swaziland 06/29/2021.  During that time he was doing well.  His blood pressure was well-controlled.  He did note occasional pain under his left breast which was relieved with Tums or aspirin.  He presents to the clinic today for follow-up evaluation and states he continues to care for his wife.  He is a primary caregiver.  She is now on hemodialysis at home.  This is stressful for him.  He reports that he has gained weight and is not exercising as much.  He is being evaluated at [facility] for hernia repair.  He reports that in the office today his blood pressure was in the mid 130s systolic.  In the clinic today his blood  pressure is 110/62.  He reports that he will take an extra quarter benazepril when he is going to doctors appointments.  He feels that he has more short of breath with increased physical activity and is noticing more fatigue.  I will order echocardiogram, CBC and BMP.  Will plan follow-up after testing.  Today he denies chest pain, lower extremity edema, fatigue, palpitations, melena, hematuria, hemoptysis, diaphoresis, weakness, presyncope, syncope, orthopnea, and PND.      Home Medications    Prior to Admission medications   Medication Sig Start Date End Date Taking? Authorizing Provider  benazepril (LOTENSIN) 20 MG tablet Take 1.5 tablets (30 mg total) by mouth daily. 01/30/23   Swaziland, Peter M, MD  celecoxib (CELEBREX) 200 MG capsule Take 1 capsule by mouth 2 (two) times daily. 01/05/15   [provider]  Choline Fenofibrate (FENOFIBRIC ACID) 135 MG CPDR Take 135 mg by mouth daily. 12/18/15   [provider]  CINNAMON PO Take 1 capsule by mouth 2 (two) times daily.     [provider]  ezetimibe (ZETIA) 10 MG tablet Take 1 tablet (10 mg total) by mouth daily. 08/02/20   Swaziland, Peter M, MD  Garlic 1000 MG CAPS Take 1,000 mg by mouth 2 (two) times daily.     [provider]  loratadine (CLARITIN) 10 MG tablet Take by mouth.    [provider]  Multiple Vitamin (MULTIVITAMIN) capsule Take by mouth.    [provider]    Family History    Family History  Problem Relation Age of Onset   Heart disease Father    Colon cancer Maternal Grandfather    Heart disease Paternal Grandmother    Esophageal cancer Neg Hx    Rectal cancer Neg Hx    Stomach cancer Neg Hx    He indicated that his mother is deceased. He indicated that his father is deceased. He indicated that the status of his maternal grandfather is unknown. He indicated that the status of his paternal grandmother is unknown. He indicated that the status of his neg hx is  unknown.  Social History    Social History   Socioeconomic History   Marital status: Married    Spouse name: Not on file   Number of children: Not on file   Years of education: Not on file   Highest education level: Not  on file  Occupational History   Not on file  Tobacco Use   Smoking status: Former    Current packs/day: 0.00    Average packs/day: 20.0 packs/day for 0.5 years (10.0 ttl pk-yrs)    Types: Cigarettes    Start date: 10/29/1982    Quit date: 04/29/1983    Years since quitting: 39.8   Smokeless tobacco: Never  Vaping Use   Vaping status: Never Used  Substance and Sexual Activity   Alcohol use: Yes    Alcohol/week: 4.0 standard drinks of alcohol    Types: 4 Standard drinks or equivalent per week    Comment: Occasionally   Drug use: No   Sexual activity: Not Currently  Other Topics Concern   Not on file  Social History Narrative   Not on file   Social Determinants of Health   Financial Resource Strain: Not on file  Food Insecurity: Low Risk  (01/09/2023)   Received from [facility]   Hunger Vital Sign    Worried About Running Out of Food in the Last Year: Never true    Ran Out of Food in the Last Year: Never true  Transportation Needs: Not on file (01/09/2023)  Physical Activity: Not on file  Stress: Not on file  Social Connections: Not on file  Intimate Partner Violence: Not on file     Review of Systems    General:  No chills, fever, night sweats or weight changes.  Cardiovascular:  No chest pain, dyspnea on exertion, edema, orthopnea, palpitations, paroxysmal nocturnal dyspnea. Dermatological: No rash, lesions/masses Respiratory: No cough, dyspnea Urologic: No hematuria, dysuria Abdominal:   No nausea, vomiting, diarrhea, bright red blood per rectum, melena, or hematemesis Neurologic:  No visual changes, wkns, changes in mental status. All other systems reviewed and are otherwise negative except as noted above.  Physical Exam    VS:  BP  110/62   Pulse 90   Ht 5\' 10"  (1.778 m)   Wt 230 lb 3.2 oz (104.4 kg)   SpO2 96%   BMI 33.03 kg/m  , BMI Body mass index is 33.03 kg/m. GEN: Well nourished, well developed, in no acute distress. HEENT: normal. Neck: Supple, no JVD, carotid bruits, or masses. Cardiac: RRR, no murmurs, rubs, or gallops. No clubbing, cyanosis, edema.  Radials/DP/PT 2+ and equal bilaterally.  Respiratory:  Respirations regular and unlabored, clear to auscultation bilaterally. GI: Soft, nontender, nondistended, BS + x 4. MS: no deformity or atrophy. Skin: warm and dry, no rash. Neuro:  Strength and sensation are intact. Psych: Normal affect.  Accessory Clinical Findings    Recent Labs: No results found for requested labs within last 365 days.   Recent Lipid Panel    Component Value Date/Time   CHOL 186 07/08/2019 0919   TRIG 140 07/08/2019 0919   HDL 56 07/08/2019 0919   CHOLHDL 3.3 07/08/2019 0919   LDLCALC 105 (H) 07/08/2019 0919         ECG personally reviewed by me today- EKG Interpretation Date/Time:  Wednesday February 26 2023 14:39:30 EDT Ventricular Rate:  90 PR Interval:  196 QRS Duration:  92 QT Interval:  356 QTC Calculation: 435 R Axis:   54  Text Interpretation: Normal sinus rhythm Normal ECG No previous ECGs available Confirmed by Edd Fabian [phone]) on 02/26/2023 2:46:51 PM    Nuclear stress test 03/26/2017  The left ventricular ejection fraction is normal (55-65%). Nuclear stress EF: 61%. There was no ST segment deviation noted during stress. Defect 1:  There is a small defect of moderate severity present in the apex location. This is a low risk study.   Low risk stress nuclear study with apical thinning but no ischemia; EF 61 with normal wall motion.     Assessment & Plan   1.  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 Continue low salt diet Increase PA Order Echo CBC, BMP  Essential hypertension-BP  today 110/62. Continue current medical therapy Heart healthy low-sodium diet Maintain blood pressure log Increase physical activity as tolerated  Chest discomfort-no chest pain today.  Denies recent episodes of arm neck back or chest discomfort.  Underwent nuclear stress testing in 2018 which showed low risk. Maintain current diet and physical activity. No plans for ischemic evaluation  Hyperlipidemia-LDL 105 on 07/08/19. High-fiber diet Continue ezetimibe, fenofibrate  Disposition: Follow-up with Dr. Swaziland or me in 3-4 months.   Thomasene Ripple. Irmgard Rampersaud NP-C     02/26/2023, 2:47 PM [facility] [address] Office [phone] Fax [phone]    I spent 15 minutes examining this patient, reviewing medications, and using patient centered shared decision making involving her cardiac care.  Prior to her visit I spent greater than 20 minutes reviewing her past medical history,  medications, and prior cardiac tests.

## 2023-02-26 ENCOUNTER — Encounter: Payer: Self-pay | Admitting: General Practice

## 2023-02-26 ENCOUNTER — Ambulatory Visit: Payer: PPO | Attending: Adult Health | Admitting: General Practice

## 2023-02-26 VITALS — BP 110/62 | HR 90 | Ht 70.0 in | Wt 230.2 lb

## 2023-02-26 DIAGNOSIS — E782 Mixed hyperlipidemia: Secondary | ICD-10-CM | POA: Diagnosis not present

## 2023-02-26 DIAGNOSIS — R5383 Other fatigue: Secondary | ICD-10-CM

## 2023-02-26 DIAGNOSIS — K4090 Unilateral inguinal hernia, without obstruction or gangrene, not specified as recurrent: Secondary | ICD-10-CM | POA: Diagnosis not present

## 2023-02-26 DIAGNOSIS — R0609 Other forms of dyspnea: Secondary | ICD-10-CM

## 2023-02-26 DIAGNOSIS — I10 Essential (primary) hypertension: Secondary | ICD-10-CM | POA: Diagnosis not present

## 2023-02-26 DIAGNOSIS — Z9889 Other specified postprocedural states: Secondary | ICD-10-CM | POA: Diagnosis not present

## 2023-02-26 DIAGNOSIS — M25551 Pain in right hip: Secondary | ICD-10-CM | POA: Diagnosis not present

## 2023-02-26 DIAGNOSIS — R0789 Other chest pain: Secondary | ICD-10-CM | POA: Diagnosis not present

## 2023-02-26 DIAGNOSIS — Z9049 Acquired absence of other specified parts of digestive tract: Secondary | ICD-10-CM | POA: Diagnosis not present

## 2023-02-26 DIAGNOSIS — R1031 Right lower quadrant pain: Secondary | ICD-10-CM | POA: Diagnosis not present

## 2023-02-26 DIAGNOSIS — Z8719 Personal history of other diseases of the digestive system: Secondary | ICD-10-CM | POA: Diagnosis not present

## 2023-02-26 NOTE — Patient Instructions (Signed)
Medication Instructions:  The current medical regimen is effective;  continue present plan and medications as directed. Please refer to the Current Medication list given to you today.  *If you need a refill on your cardiac medications before your next appointment, please call your pharmacy*  Lab Work: CBC AND BMET TODAY  If you have labs (blood work) drawn today and your tests are completely normal, you will receive your results only by:  MyChart Message (if you have MyChart) OR  A paper copy in the mail If you have any lab test that is abnormal or we need to change your treatment, we will call you to review the results.  Testing/Procedures: Your physician has requested that you have an echocardiogram. Echocardiography is a painless test that uses sound waves to create images of your heart. It provides your doctor with information about the size and shape of your heart and how well your heart's chambers and valves are working. This procedure takes approximately one hour. There are no restrictions for this procedure. Please do NOT wear cologne, perfume, aftershave, or lotions (deodorant is allowed). Please arrive 15 minutes prior to your appointment time.  MAINTAIN PHYSICAL ACTIVITY PLEASE READ AND FOLLOW ATTACHED  SALTY 6   Follow-Up: At [facility], you and your health needs are our priority.  As part of our continuing mission to provide you with exceptional heart care, we have created designated Provider Care Teams.  These Care Teams include your primary Cardiologist (physician) and Advanced Practice Providers (APPs -  Physician Assistants and Nurse Practitioners) who all work together to provide you with the care you need, when you need it.  Your next appointment:   AFTER ECHO   Provider:   Edd Fabian, FNP-C

## 2023-02-27 LAB — BASIC METABOLIC PANEL
BUN/Creatinine Ratio: 20 (ref 10–24)
BUN: 22 mg/dL (ref 8–27)
CO2: 23 mmol/L (ref 20–29)
Calcium: 9.3 mg/dL (ref 8.6–10.2)
Chloride: 102 mmol/L (ref 96–106)
Creatinine, Ser: 1.08 mg/dL (ref 0.76–1.27)
Glucose: 180 mg/dL — ABNORMAL HIGH (ref 70–99)
Potassium: 4.4 mmol/L (ref 3.5–5.2)
Sodium: 138 mmol/L (ref 134–144)
eGFR: 70 mL/min/{1.73_m2} (ref >59)

## 2023-02-27 LAB — CBC
Hematocrit: 45.8 % (ref 37.5–51.0)
Hemoglobin: 15.2 g/dL (ref 13.0–17.7)
MCH: 30 pg (ref 26.6–33.0)
MCHC: 33.2 g/dL (ref 31.5–35.7)
MCV: 90 fL (ref 79–97)
Platelets: 343 10*3/uL (ref 150–450)
RBC: 5.07 x10E6/uL (ref 4.14–5.80)
RDW: 12.3 % (ref 11.6–15.4)
WBC: 6.1 10*3/uL (ref 3.4–10.8)

## 2023-03-06 DIAGNOSIS — H35363 Drusen (degenerative) of macula, bilateral: Secondary | ICD-10-CM | POA: Diagnosis not present

## 2023-03-06 DIAGNOSIS — H35371 Puckering of macula, right eye: Secondary | ICD-10-CM | POA: Diagnosis not present

## 2023-03-06 DIAGNOSIS — Z9889 Other specified postprocedural states: Secondary | ICD-10-CM | POA: Diagnosis not present

## 2023-03-06 DIAGNOSIS — Z961 Presence of intraocular lens: Secondary | ICD-10-CM | POA: Diagnosis not present

## 2023-03-11 ENCOUNTER — Ambulatory Visit (HOSPITAL_COMMUNITY): Payer: PPO | Attending: General Practice

## 2023-03-11 ENCOUNTER — Ambulatory Visit (HOSPITAL_COMMUNITY): Payer: PPO

## 2023-03-11 DIAGNOSIS — H53002 Unspecified amblyopia, left eye: Secondary | ICD-10-CM | POA: Diagnosis not present

## 2023-03-11 DIAGNOSIS — H35363 Drusen (degenerative) of macula, bilateral: Secondary | ICD-10-CM | POA: Diagnosis not present

## 2023-03-11 DIAGNOSIS — R0609 Other forms of dyspnea: Secondary | ICD-10-CM | POA: Insufficient documentation

## 2023-03-11 DIAGNOSIS — H35371 Puckering of macula, right eye: Secondary | ICD-10-CM | POA: Diagnosis not present

## 2023-03-11 DIAGNOSIS — R5383 Other fatigue: Secondary | ICD-10-CM | POA: Insufficient documentation

## 2023-03-11 DIAGNOSIS — Z961 Presence of intraocular lens: Secondary | ICD-10-CM | POA: Diagnosis not present

## 2023-03-11 LAB — ECHOCARDIOGRAM COMPLETE
Area-P 1/2: 2.85 cm2
S' Lateral: 3.2 cm

## 2023-03-11 NOTE — Progress Notes (Unsigned)
Cardiology Clinic Note   Patient Name: [name] Date of Encounter: 03/13/2023  Primary Care Provider:  System, Provider Not In Primary Cardiologist:  Peter Swaziland, MD  Patient Profile    [name] 77 year old male presents the clinic today for follow-up evaluation of his hypertension and hyperlipidemia.  Past Medical History    Past Medical History:  Diagnosis Date   Allergy    Asthma    as a child   Cataract    Colon polyps    Depression    Dyspnea    Epigastric pain    Hard of hearing    Headache    migraines   Heart murmur    History of bronchitis    History of pneumonia    Hyperlipidemia    Hypertension    Knee pain    Nausea    Occasional   Osteoarthritis    Sigmoid diverticulosis    Wears glasses    Past Surgical History:  Procedure Laterality Date   APPENDECTOMY     BACK SURGERY     COLONOSCOPY  11/06/2004   "several"   EYE SURGERY Left    HERNIA REPAIR     JOINT REPLACEMENT     MASS EXCISION Right 01/26/2015   Procedure: REMOVAL OF RIGHT CHEST WALL MASS;  Surgeon: Avel Peace, MD;  Location: [facility];  Service: General;  Laterality: Right;   MULTIPLE TOOTH EXTRACTIONS     POLYPECTOMY     SHOULDER SURGERY Right 2000s   "severed muscle years beforee; broke collar bone; dr repaired both at the same time"   TONSILLECTOMY     TOTAL KNEE ARTHROPLASTY Left 04/10/2016   Procedure: LEFT TOTAL KNEE ARTHROPLASTY;  Surgeon: Loreta Ave, MD;  Location: [facility] OR;  Service: Orthopedics;  Laterality: Left;  LEFT TOTAL KNEE ARTHROPLASTY   TOTAL KNEE ARTHROPLASTY Right 08/14/2016   TOTAL KNEE ARTHROPLASTY Right 08/14/2016   Procedure: RIGHT TOTAL KNEE ARTHROPLASTY;  Surgeon: Loreta Ave, MD;  Location: [facility] OR;  Service: Orthopedics;  Laterality: Right;    Allergies  Allergies  Allergen Reactions   Amoxicillin Hives and Rash    Has patient had a PCN reaction causing immediate rash, facial/tongue/throat swelling, SOB or  lightheadedness with hypotension: #  #  #  YES  #  #  #  Has patient had a PCN reaction causing SEVERE RASH INVOLVING MUCUS MEMBRANES or SKIN NECROSIS: #  #  #  YES  #  #  #  Has patient had a PCN reaction that required hospitalization No Has patient had a PCN reaction occurring within the last 10 years: #  #  #  YES  #  #  #  If all of the above answers are "NO", then may proceed with Cephalosporin use.    Codeine Other (See Comments)    *  *  Pt does not have allergy to codeine but father had severe reaction to codeine so he never wants to be given to him  *  *   Toprol Xl [Metoprolol Succinate] Shortness Of Breath   Atorvastatin Other (See Comments)    Weakness in muscles   Statins Other (See Comments)    Weakness in muscles respiratory   Other     intolerant   Niacin Itching    Other reaction(s): Itching of eye    History of Present Illness    [name] is a PMH of multiple drug intolerances, hypertension, and  hyperlipidemia.  He underwent exercise treadmill test 3/15 which was normal.  He had L4-5 back surgery 4/17.  In October 2017 he underwent left TKA and then 3/18 he had right TKA.  He is a patient at [facility].  He was noted to have atypical chest pain in 2018.  He underwent Myoview stress testing.  It showed low risk.  He followed up with Dr. Swaziland 06/29/2021.  During that time he was doing well.  His blood pressure was well-controlled.  He did note occasional pain under his left breast which was relieved with Tums or aspirin.  He presented to the clinic 9/24 for follow-up evaluation and stated he continued to care for his wife.  He is her primary caregiver.  She is now on hemodialysis at home.  This is stressful for him.  He reported that he had gained weight and was not exercising as much.  He was being evaluated at [facility] for hernia repair.  He reported that in their office his blood pressure was in the mid 130s systolic.  In our clinic today his blood  pressure was 110/62.  He reported that he would take an extra quarter benazepril when he was going to doctors appointments.  He felt that he had more shortness of breath with increased physical activity and was noticing more fatigue.  I ordered echocardiogram, CBC and BMP.  Will plan follow-up after testing.  His lab work showed elevated glucose at 180 but was otherwise reassuring.  His echocardiogram showed LVEF of 55-60 EF, G1DD, aortic root mild dilation 38mm and no valve abnormalities.  He presents to the clinic today for follow-up evaluation states [facility].  He reports that his hernia surgery has been postponed.  We reviewed his recent lab work and echocardiogram.  He expressed understanding.  I encouraged him to increase his physical activity as tolerated.  He reports that his wife is in the process of getting a new insulin pump.  He hopes that this will reduce some of his stress related to her care.  She continues to do home dialysis.  Will plan follow-up in 6 months.  I will refill his benazepril.  Today he denies chest pain, lower extremity edema, fatigue, palpitations, melena, hematuria, hemoptysis, diaphoresis, weakness, presyncope, syncope, orthopnea, and PND.      Home Medications    Prior to Admission medications   Medication Sig Start Date End Date Taking? Authorizing Provider  benazepril (LOTENSIN) 20 MG tablet Take 1.5 tablets (30 mg total) by mouth daily. 01/30/23   Swaziland, Peter M, MD  celecoxib (CELEBREX) 200 MG capsule Take 1 capsule by mouth 2 (two) times daily. 01/05/15   [provider]  Choline Fenofibrate (FENOFIBRIC ACID) 135 MG CPDR Take 135 mg by mouth daily. 12/18/15   [provider]  CINNAMON PO Take 1 capsule by mouth 2 (two) times daily.     [provider]  ezetimibe (ZETIA) 10 MG tablet Take 1 tablet (10 mg total) by mouth daily. 08/02/20   Swaziland, Peter M, MD  Garlic 1000 MG CAPS Take 1,000  mg by mouth 2 (two) times daily.     [provider]  loratadine (CLARITIN) 10 MG tablet Take by mouth.    [provider]  Multiple Vitamin (MULTIVITAMIN) capsule Take by mouth.    [provider]    Family History    Family History  Problem Relation Age of Onset  Heart disease Father    Colon cancer Maternal Grandfather    Heart disease Paternal Grandmother    Esophageal cancer Neg Hx    Rectal cancer Neg Hx    Stomach cancer Neg Hx    He indicated that his mother is deceased. He indicated that his father is deceased. He indicated that the status of his maternal grandfather is unknown. He indicated that the status of his paternal grandmother is unknown. He indicated that the status of his neg hx is unknown.  Social History    Social History   Socioeconomic History   Marital status: Married    Spouse name: Not on file   Number of children: Not on file   Years of education: Not on file   Highest education level: Not on file  Occupational History   Not on file  Tobacco Use   Smoking status: Former    Current packs/day: 0.00    Average packs/day: 20.0 packs/day for 0.5 years (10.0 ttl pk-yrs)    Types: Cigarettes    Start date: 10/29/1982    Quit date: 04/29/1983    Years since quitting: 39.8   Smokeless tobacco: Never  Vaping Use   Vaping status: Never Used  Substance and Sexual Activity   Alcohol use: Yes    Alcohol/week: 4.0 standard drinks of alcohol    Types: 4 Standard drinks or equivalent per week    Comment: Occasionally   Drug use: No   Sexual activity: Not Currently  Other Topics Concern   Not on file  Social History Narrative   Not on file   Social Determinants of Health   Financial Resource Strain: Not on file  Food Insecurity: Low Risk  (01/09/2023)   Received from [facility]   Hunger Vital Sign    Worried About Running Out of Food in the Last Year: Never true    Ran Out of Food in the Last Year: Never true   Transportation Needs: Not on file (01/09/2023)  Physical Activity: Not on file  Stress: Not on file  Social Connections: Not on file  Intimate Partner Violence: Not on file     Review of Systems    General:  No chills, fever, night sweats or weight changes.  Cardiovascular:  No chest pain, dyspnea on exertion, edema, orthopnea, palpitations, paroxysmal nocturnal dyspnea. Dermatological: No rash, lesions/masses Respiratory: No cough, dyspnea Urologic: No hematuria, dysuria Abdominal:   No nausea, vomiting, diarrhea, bright red blood per rectum, melena, or hematemesis Neurologic:  No visual changes, wkns, changes in mental status. All other systems reviewed and are otherwise negative except as noted above.  Physical Exam    VS:  BP 112/72 (BP Location: Left Arm, Patient Position: Sitting, Cuff Size: Normal)   Pulse 81   Ht 5\' 10"  (1.778 m)   Wt 223 lb 12.8 oz (101.5 kg)   SpO2 94%   BMI 32.11 kg/m  , BMI Body mass index is 32.11 kg/m. GEN: Well nourished, well developed, in no acute distress. HEENT: normal. Neck: Supple, no JVD, carotid bruits, or masses. Cardiac: RRR, no murmurs, rubs, or gallops. No clubbing, cyanosis, edema.  Radials/DP/PT 2+ and equal bilaterally.  Respiratory:  Respirations regular and unlabored, clear to auscultation bilaterally. GI: Soft, nontender, nondistended, BS + x 4. MS: no deformity or atrophy. Skin: warm and dry, no rash. Neuro:  Strength and sensation are intact. Psych: Normal affect.  Accessory Clinical Findings    Recent Labs: 02/26/2023: BUN 22; Creatinine, Ser 1.08; Hemoglobin  15.2; Platelets 343; Potassium 4.4; Sodium 138   Recent Lipid Panel    Component Value Date/Time   CHOL 186 07/08/2019 0919   TRIG 140 07/08/2019 0919   HDL 56 07/08/2019 0919   CHOLHDL 3.3 07/08/2019 0919   LDLCALC 105 (H) 07/08/2019 0919         ECG personally reviewed by me today-  none today.     Nuclear stress test 03/26/2017  The left  ventricular ejection fraction is normal (55-65%). Nuclear stress EF: 61%. There was no ST segment deviation noted during stress. Defect 1: There is a small defect of moderate severity present in the apex location. This is a low risk study.   Low risk stress nuclear study with apical thinning but no ischemia; EF 61 with normal wall motion.   Echo 03/11/23  IMPRESSIONS     1. Left ventricular ejection fraction, by estimation, is 55 to 60%. The  left ventricle has normal function. The left ventricle has no regional  wall motion abnormalities. Left ventricular diastolic parameters are  consistent with Grade I diastolic  dysfunction (impaired relaxation).   2. Right ventricular systolic function is normal. The right ventricular  size is normal. There is normal pulmonary artery systolic pressure. The  estimated right ventricular systolic pressure is 27.6 mmHg.   3. The mitral valve is normal in structure. Trivial mitral valve  regurgitation. No evidence of mitral stenosis.   4. The aortic valve is tricuspid. Aortic valve regurgitation is not  visualized. No aortic stenosis is present.   5. Aortic dilatation noted. There is mild dilatation of the aortic root,  measuring 38 mm.   6. The inferior vena cava is normal in size with greater than 50%  respiratory variability, suggesting right atrial pressure of 3 mmHg.   FINDINGS   Left Ventricle: Left ventricular ejection fraction, by estimation, is 55  to 60%. The left ventricle has normal function. The left ventricle has no  regional wall motion abnormalities. The left ventricular internal cavity  size was normal in size. There is   no left ventricular hypertrophy. Left ventricular diastolic parameters  are consistent with Grade I diastolic dysfunction (impaired relaxation).   Right Ventricle: The right ventricular size is normal. No increase in  right ventricular wall thickness. Right ventricular systolic function is  normal. There is  normal pulmonary artery systolic pressure. The tricuspid  regurgitant velocity is 2.48 m/s, and   with an assumed right atrial pressure of 3 mmHg, the estimated right  ventricular systolic pressure is 27.6 mmHg.   Left Atrium: Left atrial size was normal in size.   Right At[facility]: Right atrial size was normal in size.   Pericardium: There is no evidence of pericardial effusion.   Mitral Valve: The mitral valve is normal in structure. There is mild  calcification of the mitral valve leaflet(s). Mild mitral annular  calcification. Trivial mitral valve regurgitation. No evidence of mitral  valve stenosis.   Tricuspid Valve: The tricuspid valve is normal in structure. Tricuspid  valve regurgitation is trivial.   Aortic Valve: The aortic valve is tricuspid. Aortic valve regurgitation is  not visualized. No aortic stenosis is present.   Pulmonic Valve: The pulmonic valve was normal in structure. Pulmonic valve  regurgitation is trivial.   Aorta: Aortic dilatation noted. There is mild dilatation of the aortic  root, measuring 38 mm.   Venous: The inferior vena cava is normal in size with greater than 50%  respiratory variability, suggesting right atrial pressure  of 3 mmHg.   IAS/Shunts: No atrial level shunt detected by color flow Doppler.    Assessment & Plan   1.  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  Echocardiogram shows normal EF and G1 DD with trivial mitral valve regurgitation.  CBC and BMP reassuring.  Fatigue and DOE appears to be related to stress and decreased deconditioning. Continue low salt diet Mindfulness stress reduction sheet given Increase physical activity as tolerated  Essential hypertension-BP today 112/72 Continue current medical therapy Heart healthy low-sodium diet Maintain blood pressure log Increase physical activity as tolerated  Chest discomfort-denies exertional chest pain.  Denies pain at rest.   Had nuclear stress testing in 2018 which showed low risk. Maintain current diet and physical activity. No plans for ischemic evaluation   Disposition: Follow-up with Dr. Swaziland or me in 6 months.   Thomasene Ripple. Joyce Leckey NP-C     03/13/2023, 10:34 AM [facility] [address] Office [phone] Fax [phone]    I spent 13 minutes examining this patient, reviewing medications, and using patient centered shared decision making involving her cardiac care.  Prior to her visit I spent greater than 20 minutes reviewing her past medical history,  medications, and prior cardiac tests.

## 2023-03-13 ENCOUNTER — Ambulatory Visit: Payer: PPO | Attending: General Practice | Admitting: General Practice

## 2023-03-13 ENCOUNTER — Encounter: Payer: Self-pay | Admitting: General Practice

## 2023-03-13 VITALS — BP 112/72 | HR 81 | Ht 70.0 in | Wt 223.8 lb

## 2023-03-13 DIAGNOSIS — I10 Essential (primary) hypertension: Secondary | ICD-10-CM

## 2023-03-13 DIAGNOSIS — R0609 Other forms of dyspnea: Secondary | ICD-10-CM

## 2023-03-13 DIAGNOSIS — R0789 Other chest pain: Secondary | ICD-10-CM

## 2023-03-13 DIAGNOSIS — R5383 Other fatigue: Secondary | ICD-10-CM

## 2023-03-13 MED ORDER — BENAZEPRIL HCL 20 MG PO TABS: 30.0000 mg | ORAL_TABLET | Freq: Every day | ORAL | 1 refills | Status: DC

## 2023-03-13 NOTE — Patient Instructions (Addendum)
Medication Instructions:  The current medical regimen is effective;  continue present plan and medications as directed. Please refer to the Current Medication list given to you today.  *If you need a refill on your cardiac medications before your next appointment, please call your pharmacy*  Lab Work: NONE If you have labs (blood work) drawn today and your tests are completely normal, you will receive your results only by:   MyChart Message (if you have MyChart) OR  A paper copy in the mail If you have any lab test that is abnormal or we need to change your treatment, we will call you to review the results.  Other Instructions INCREASE PHYSICAL ACTIVITY-AS TOLERATED PLEASE READ ATTACHED-STRESS REDUCTION TIPS  Follow-Up: At [facility], you and your health needs are our priority.  As part of our continuing mission to provide you with exceptional heart care, we have created designated Provider Care Teams.  These Care Teams include your primary Cardiologist (physician) and Advanced Practice Providers (APPs -  Physician Assistants and Nurse Practitioners) who all work together to provide you with the care you need, when you need it.  Your next appointment:   6 month(s)  Provider:   Peter Swaziland, MD  or Edd Fabian, FNP        Mindfulness-Based Stress Reduction Mindfulness-based stress reduction (MBSR) is a program that helps people learn to practice mindfulness. Mindfulness is the practice of consciously paying attention to the present moment. MBSR focuses on developing self-awareness, which lets you respond to life stress without judgment or negative feelings. It can be learned and practiced through techniques such as education, breathing exercises, meditation, and yoga. MBSR includes several mindfulness techniques in one program. MBSR works best when you understand the treatment, are willing to try new things, and can commit to spending time practicing what you learn. MBSR training  may include learning about: How your feelings, thoughts, and reactions affect your body. New ways to respond to things that cause negative thoughts to start (triggers). How to notice your thoughts and let go of them. Practicing awareness of everyday things that you normally do without thinking. The techniques and goals of different types of meditation. What are the benefits of MBSR? MBSR can have many benefits, which include helping you to: Develop self-awareness. This means knowing and understanding yourself. Learn skills and attitudes that help you to take part in your own health care. Learn new ways to care for yourself. Be more accepting about how things are, and let things go. Be less judgmental and approach things with an open mind. Be patient with yourself and trust yourself more. MBSR has also been shown to: Reduce negative emotions, such as sadness, overwhelm, and worry. Improve memory and focus. Change how you sense and react to pain. Boost your body's ability to fight infections. Help you connect better with other people. Improve your sense of well-being. How to practice mindfulness To do a basic awareness exercise: Find a comfortable place to sit. Pay attention to the present moment. Notice your thoughts, feelings, and surroundings just as they are. Avoid judging yourself, your feelings, or your surroundings. Make note of any judgment that comes up and let it go. Your mind may wander, and that is okay. Make note of when your thoughts drift, and return your attention to the present moment. To do basic mindfulness meditation: Find a comfortable place to sit. This may include a stable chair or a firm floor cushion. Sit upright with your back straight. Let  your arms fall next to your sides, with your hands resting on your legs. If you are sitting in a chair, rest your feet flat on the floor. If you are sitting on a cushion, cross your legs in front of you. Keep your head in a  neutral position with your chin dropped slightly. Relax your jaw and rest the tip of your tongue on the roof of your mouth. Drop your gaze to the floor or close your eyes. Breathe normally and pay attention to your breath. Feel the air moving in and out of your nose. Feel your belly expanding and relaxing with each breath. Your mind may wander, and that is okay. Make note of when your thoughts drift, and return your attention to your breath. Avoid judging yourself, your feelings, or your surroundings. Make note of any judgment or feelings that come up, let them go, and bring your attention back to your breath. When you are ready, lift your gaze or open your eyes. Pay attention to how your body feels after the meditation. Follow these instructions at home:  Find a local in-person or online MBSR program. Set aside some time regularly for mindfulness practice. Practice every day if you can. Even 10 minutes of practice is helpful. Find a mindfulness practice that works best for you. This may include one or more of the following: Meditation. This involves focusing your mind on a certain thought or activity. Breathing awareness exercises. These help you to stay present by focusing on your breath. Body scan. For this practice, you lie down and pay attention to each part of your body from head to toe. You can identify tension and soreness and consciously relax parts of your body. Yoga. Yoga involves stretching and breathing, and it can improve your ability to move and be flexible. It can also help you to test your body's limits, which can help you release stress. Mindful eating. This way of eating involves focusing on the taste, texture, color, and smell of each bite of food. This slows down eating and helps you feel full sooner. For this reason, it can be an important part of a weight loss plan. Find a podcast or recording that provides guidance for breathing awareness, body scan, or meditation exercises.  You can listen to these any time when you have a free moment to rest without distractions. Follow your treatment plan as told by your health care provider. This may include taking regular medicines and making changes to your diet or lifestyle as recommended. Where to find more information You can find more information about MBSR from: Your health care provider. Community-based meditation centers or programs. Programs offered near you. Summary Mindfulness-based stress reduction (MBSR) is a program that teaches you how to consciously pay attention to the present moment. It is used to help you deal better with daily stress, feelings, and pain. MBSR focuses on developing self-awareness, which allows you to respond to life stress without judgment or negative feelings. MBSR programs may involve learning different mindfulness practices, such as breathing exercises, meditation, yoga, body scan, or mindful eating. Find a mindfulness practice that works best for you, and set aside time for it on a regular basis. This information is not intended to replace advice given to you by your health care provider. Make sure you discuss any questions you have with your health care provider. Document Revised: 01/04/2021 Document Reviewed: 01/04/2021 Elsevier Patient Education  2024 [employer].

## 2023-03-13 NOTE — Addendum Note (Signed)
Addended by: Alyson Ingles on: 03/13/2023 11:03 AM   Modules accepted: Orders

## 2023-03-25 DIAGNOSIS — R1031 Right lower quadrant pain: Secondary | ICD-10-CM | POA: Diagnosis not present

## 2023-03-26 DIAGNOSIS — R109 Unspecified abdominal pain: Secondary | ICD-10-CM | POA: Diagnosis not present

## 2023-03-26 DIAGNOSIS — R1031 Right lower quadrant pain: Secondary | ICD-10-CM | POA: Diagnosis not present

## 2023-07-17 DIAGNOSIS — N401 Enlarged prostate with lower urinary tract symptoms: Secondary | ICD-10-CM | POA: Diagnosis not present

## 2023-07-17 DIAGNOSIS — R972 Elevated prostate specific antigen [PSA]: Secondary | ICD-10-CM | POA: Diagnosis not present

## 2023-07-17 DIAGNOSIS — N529 Male erectile dysfunction, unspecified: Secondary | ICD-10-CM | POA: Diagnosis not present

## 2023-09-01 NOTE — Progress Notes (Unsigned)
 [name] Date of Birth: 07-Aug-1945 Medical Record #[id number]  History of Present Illness: [name] is seen for  follow up of HTN and hyperlipidemia. He has multiple drug intolerances.    He had a normal ETT in March 2015. He did undergo L4-5 back surgery in April 2017.  In October 2017 he underwent left TKR and in March 2018 he had a right TKR.  He is followed at the [facility].   In 2018 he had atypical chest pain and Myoview was done. It was low risk.  On follow up today he is doing well. BP has been well controlled.   Has occasional pain beneath his left breast relieved with either ASA or TUMS.   Current Outpatient Medications on File Prior to Visit  Medication Sig Dispense Refill   B Complex-Folic Acid (B COMPLEX VITAMINS, W/ FA,) CAPS Take 1 capsule by mouth daily.     benazepril (LOTENSIN) 20 MG tablet Take 1.5 tablets (30 mg total) by mouth daily. 135 tablet 1   Carboxymethylcellulose Sod PF (THERATEARS PF) 0.25 % SOLN Apply to eye as needed.     celecoxib (CELEBREX) 200 MG capsule Take 1 capsule by mouth 2 (two) times daily.     chlorhexidine (PERIDEX) 0.12 % solution Use as directed 15 mLs in the mouth or throat as needed.     Choline Fenofibrate (FENOFIBRIC ACID) 135 MG CPDR Take 135 mg by mouth daily.     CINNAMON PO Take 1 capsule by mouth 2 (two) times daily.      ezetimibe (ZETIA) 10 MG tablet Take 1 tablet (10 mg total) by mouth daily. 90 tablet 3   Garlic 1000 MG CAPS Take 1,000 mg by mouth 2 (two) times daily.      loratadine (CLARITIN) 10 MG tablet Take by mouth.     oxyCODONE-acetaminophen (PERCOCET/ROXICET) 5-325 MG tablet Take 1 tablet by mouth as needed.     oxymetazoline (NASAL RELIEF) 0.05 % nasal spray Place 1 spray into both nostrils as needed.     triamcinolone acetonide (KENALOG-40) 40 MG/ML injection Inject 40 mg into the muscle once.     Vitamin D, Ergocalciferol, (DRISDOL) 1.25 MG (50000 UNIT) CAPS capsule Take 50,000 Units by mouth once a week.      No current facility-administered medications on file prior to visit.    Allergies  Allergen Reactions   Amoxicillin Hives and Rash    Has patient had a PCN reaction causing immediate rash, facial/tongue/throat swelling, [name]: #  #  #  YES  #  #  #  Has patient had a PCN reaction causing SEVERE RASH INVOLVING MUCUS MEMBRANES or SKIN NECROSIS: #  #  #  YES  #  #  #  Has patient had a PCN reaction that required hospitalization No Has patient had a PCN reaction occurring within the last 10 years: #  #  #  YES  #  #  #  If all of the above answers are "NO", then may proceed with Cephalosporin use.    Codeine Other (See Comments)    *  *  Pt does not have allergy to codeine but father had severe reaction to codeine so he never wants to be given to him  *  *   Toprol Xl [Metoprolol Succinate] Shortness Of Breath   Atorvastatin Other (See Comments)    Weakness in muscles   Statins Other (See Comments)    Weakness in muscles respiratory  Other     intolerant   Niacin Itching    Other reaction(s): Itching of eye    Past Medical History:  Diagnosis Date   Allergy    Asthma    as a child   Cataract    Colon polyps    Depression    Dyspnea    Epigastric pain    Hard of hearing    Headache    migraines   Heart murmur    History of bronchitis    History of pneumonia    Hyperlipidemia    Hypertension    Knee pain    Nausea    Occasional   Osteoarthritis    Sigmoid diverticulosis    Wears glasses     Past Surgical History:  Procedure Laterality Date   APPENDECTOMY     BACK SURGERY     COLONOSCOPY  11/06/2004   "several"   EYE SURGERY Left    HERNIA REPAIR     JOINT REPLACEMENT     MASS EXCISION Right 01/26/2015   Procedure: REMOVAL OF RIGHT CHEST WALL MASS;  Surgeon: Avel Peace, MD;  Location: [facility];  Service: General;  Laterality: Right;   MULTIPLE TOOTH EXTRACTIONS     POLYPECTOMY     SHOULDER SURGERY  Right 2000s   "severed muscle years beforee; broke collar bone; dr repaired both at the same time"   TONSILLECTOMY     TOTAL KNEE ARTHROPLASTY Left 04/10/2016   Procedure: LEFT TOTAL KNEE ARTHROPLASTY;  Surgeon: Loreta Ave, MD;  Location[facility];  Service: Orthopedics;  Laterality: Left;  LEFT TOTAL KNEE ARTHROPLASTY   TOTAL KNEE ARTHROPLASTY Right 08/14/2016   TOTAL KNEE ARTHROPLASTY Right 08/14/2016   Procedure: RIGHT TOTAL KNEE ARTHROPLASTY;  Surgeon: Loreta Ave, MD;  Location: [facility] OR;  Service: Orthopedics;  Laterality: Right;    Social History   Tobacco Use  Smoking Status Former   Current packs/day: 0.00   Average packs/day: 20.0 packs/day for 0.5 years (10.0 ttl pk-yrs)   Types: Cigarettes   Start date: 10/29/1982   Quit date: 04/29/1983   Years since quitting: 40.3  Smokeless Tobacco Never    Social History   Substance and Sexual Activity  Alcohol Use Yes   Alcohol/week: 4.0 standard drinks of alcohol   Types: 4 Standard drinks or equivalent per week   Comment: Occasionally    Family History  Problem Relation Age of Onset   Heart disease Father    Colon cancer Maternal Grandfather    Heart disease Paternal Grandmother    Esophageal cancer Neg Hx    Rectal cancer Neg Hx    Stomach cancer Neg Hx     Review of Systems: As noted in HPI.  All other systems were reviewed and are negative.  Physical Exam: There were no vitals taken for this visit. GENERAL:  Well appearing WM in NAD. obese HEENT:  PERRL, EOMI, sclera are clear. Oropharynx is clear. NECK:  No jugular venous distention, carotid upstroke brisk and symmetric, no bruits, no thyromegaly or adenopathy LUNGS:  Clear to auscultation bilaterally CHEST:  Unremarkable HEART:  RRR,  PMI not displaced or sustained,S1 and S2 within normal limits, no S3, no S4: no clicks, no rubs, no murmurs ABD:  Soft, nontender. BS +, no masses or bruits. No hepatomegaly, no splenomegaly EXT:  2 + pulses throughout, no  edema, no cyanosis no clubbing SKIN:  Warm and dry.  No rashes NEURO:  Alert and oriented x 3.  Cranial nerves II through XII intact. PSYCH:  Cognitively intact    LABORATORY DATA:   Lab Results  Component Value Date   WBC 6.1 02/26/2023   HGB 15.2 02/26/2023   HCT 45.8 02/26/2023   PLT 343 02/26/2023   GLUCOSE 180 (H) 02/26/2023   CHOL 186 07/08/2019   TRIG 140 07/08/2019   HDL 56 07/08/2019   LDLCALC 105 (H) 07/08/2019   ALT 29 07/08/2019   AST 23 07/08/2019   NA 138 02/26/2023   K 4.4 02/26/2023   CL 102 02/26/2023   CREATININE 1.08 02/26/2023   BUN 22 02/26/2023   CO2 23 02/26/2023   TSH 3.460 07/08/2019   INR 1.04 03/29/2016   HGBA1C 5.8 (H) 07/08/2019    Labs reviewed from the [place] in 09/26/15: CBC, CMET, TSH all normal. A1c 5.6%. Cholesterol 226, trig 113, HDL 76, LDL 122.   Ecg today showed NSR with rate 69. First degree AV block. Otherwise Normal. I have personally reviewed and interpreted this study.   Myoview 03/26/17: Study Highlights     The left ventricular ejection fraction is normal (55-65%). Nuclear stress EF: 61%. There was no ST segment deviation noted during stress. Defect 1: There is a small defect of moderate severity present in the apex location. This is a low risk study.   Low risk stress nuclear study with apical thinning but no ischemia; EF 61 with normal wall motion.     Echo 03/11/23: IMPRESSIONS     1. Left ventricular ejection fraction, by estimation, is 55 to 60%. The  left ventricle has normal function. The left ventricle has no regional  wall motion abnormalities. Left ventricular diastolic parameters are  consistent with Grade I diastolic  dysfunction (impaired relaxation).   2. Right ventricular systolic function is normal. The right ventricular  size is normal. There is normal pulmonary artery systolic pressure. The  estimated right ventricular systolic pressure is 27.6 mmHg.   3. The mitral valve is normal in structure.  Trivial mitral valve  regurgitation. No evidence of mitral stenosis.   4. The aortic valve is tricuspid. Aortic valve regurgitation is not  visualized. No aortic stenosis is present.   5. Aortic dilatation noted. There is mild dilatation of the aortic root,  measuring 38 mm.   6. The inferior vena cava is normal in size with greater than 50%  respiratory variability, suggesting right atrial pressure of 3 mmHg.    Assessment / Plan: 1. Hypertension. Well controlled today on benazepril. Continue 10 mg daily  2. Hyperlipidemia.  Intolerant to statins in the past. Continue Zetia and fenofibrate 160 mg daily. continue fish oil. [facility]  3. Atypical chest pain but with  risk factors. Myoview in October 2018 was low risk. Normal EF.   4. Obesity. Encouraged continued weight loss and aerobic activity.  5. PACs. Benign.  I will follow up in one year

## 2023-09-05 ENCOUNTER — Ambulatory Visit: Payer: PPO | Attending: Cardiology | Admitting: Cardiology

## 2023-09-05 ENCOUNTER — Encounter: Payer: Self-pay | Admitting: Cardiology

## 2023-09-05 VITALS — BP 138/84 | HR 87 | Ht 70.0 in | Wt 230.4 lb

## 2023-09-05 DIAGNOSIS — E782 Mixed hyperlipidemia: Secondary | ICD-10-CM

## 2023-09-05 DIAGNOSIS — I10 Essential (primary) hypertension: Secondary | ICD-10-CM

## 2023-09-05 NOTE — Patient Instructions (Signed)
 Medication Instructions:  Continue same medications *If you need a refill on your cardiac medications before your next appointment, please call your pharmacy*  Lab Work: None ordered  Testing/Procedures: None ordered  Follow-Up: At [facility], you and your health needs are our priority.  As part of our continuing mission to provide you with exceptional heart care, our providers are all part of one team.  This team includes your primary Cardiologist (physician) and Advanced Practice Providers or APPs (Physician Assistants and Nurse Practitioners) who all work together to provide you with the care you need, when you need it.  Your next appointment:  1 year    Call in Dec to schedule March appointment     Provider:  Dr.Jordan    We recommend signing up for the patient portal called "MyChart".  Sign up information is provided on this After Visit Summary.  MyChart is used to connect with patients for Virtual Visits (Telemedicine).  Patients are able to view lab/test results, encounter notes, upcoming appointments, etc.  Non-urgent messages can be sent to your provider as well.   To learn more about what you can do with MyChart, go to [email or web address].         1st Floor: - Lobby - Registration  - Pharmacy  - Lab - Cafe  2nd Floor: - PV Lab - Diagnostic Testing (echo, CT, nuclear med)  3rd Floor: - Vacant  4th Floor: - TCTS (cardiothoracic surgery) - AFib Clinic - Structural Heart Clinic - Vascular Surgery  - Vascular Ultrasound  5th Floor: - [facility] Cardiology (general and EP) - Clinical Pharmacy for coumadin, hypertension, lipid, weight-loss medications, and med management appointments    Valet parking services will be available as well.

## 2023-10-15 DIAGNOSIS — R972 Elevated prostate specific antigen [PSA]: Secondary | ICD-10-CM | POA: Diagnosis not present

## 2023-11-29 ENCOUNTER — Other Ambulatory Visit: Payer: Self-pay | Admitting: General Practice

## 2023-12-02 ENCOUNTER — Other Ambulatory Visit: Payer: Self-pay

## 2023-12-02 MED ORDER — BENAZEPRIL HCL 20 MG PO TABS: 30.0000 mg | ORAL_TABLET | Freq: Every day | ORAL | 2 refills | Status: AC

## 2023-12-02 NOTE — Telephone Encounter (Signed)
*  STAT* If patient is at the pharmacy, call can be transferred to refill team.   1. Which medications need to be refilled? (please list name of each medication and dose if known) new prescription for Benazepril    2. Would you like to learn more about the convenience, safety, & potential cost savings by using the [facility]?     3. Are you open to using the [facility] (Type [facility].    4. Which pharmacy/location (including street and city if local pharmacy) is medication to be sent to? [facility] [place]   5. Do they need a 30 day or 90 day supply? 90 days and refills- please call today- completely out of it

## 2024-02-06 DIAGNOSIS — R972 Elevated prostate specific antigen [PSA]: Secondary | ICD-10-CM | POA: Diagnosis not present

## 2024-02-06 DIAGNOSIS — Z8042 Family history of malignant neoplasm of prostate: Secondary | ICD-10-CM | POA: Diagnosis not present

## 2024-02-06 DIAGNOSIS — N401 Enlarged prostate with lower urinary tract symptoms: Secondary | ICD-10-CM | POA: Diagnosis not present

## 2024-02-12 DIAGNOSIS — Z8042 Family history of malignant neoplasm of prostate: Secondary | ICD-10-CM | POA: Diagnosis not present

## 2024-02-12 DIAGNOSIS — R972 Elevated prostate specific antigen [PSA]: Secondary | ICD-10-CM | POA: Diagnosis not present

## 2024-02-12 DIAGNOSIS — N401 Enlarged prostate with lower urinary tract symptoms: Secondary | ICD-10-CM | POA: Diagnosis not present

## 2024-03-17 DIAGNOSIS — H35371 Puckering of macula, right eye: Secondary | ICD-10-CM | POA: Diagnosis not present

## 2024-03-17 DIAGNOSIS — Z961 Presence of intraocular lens: Secondary | ICD-10-CM | POA: Diagnosis not present

## 2024-03-17 DIAGNOSIS — H04123 Dry eye syndrome of bilateral lacrimal glands: Secondary | ICD-10-CM | POA: Diagnosis not present

## 2024-03-17 DIAGNOSIS — H35363 Drusen (degenerative) of macula, bilateral: Secondary | ICD-10-CM | POA: Diagnosis not present

## 2024-03-17 DIAGNOSIS — Z9889 Other specified postprocedural states: Secondary | ICD-10-CM | POA: Diagnosis not present

## 2024-10-25 ENCOUNTER — Ambulatory Visit: Admitting: Cardiology
